# Patient Record
Sex: Female | Born: 1940 | Race: White | Hispanic: No | Smoking: Current every day smoker
Health system: Southern US, Community
[De-identification: ages and names within clinical notes are randomized; demographics above are authoritative.]

## PROBLEM LIST (undated history)

## (undated) DIAGNOSIS — E785 Hyperlipidemia, unspecified: Secondary | ICD-10-CM

## (undated) DIAGNOSIS — C541 Malignant neoplasm of endometrium: Secondary | ICD-10-CM

## (undated) DIAGNOSIS — F329 Major depressive disorder, single episode, unspecified: Secondary | ICD-10-CM

## (undated) DIAGNOSIS — C4491 Basal cell carcinoma of skin, unspecified: Secondary | ICD-10-CM

## (undated) DIAGNOSIS — T4145XA Adverse effect of unspecified anesthetic, initial encounter: Secondary | ICD-10-CM

## (undated) DIAGNOSIS — M1990 Unspecified osteoarthritis, unspecified site: Secondary | ICD-10-CM

## (undated) DIAGNOSIS — F32A Depression, unspecified: Secondary | ICD-10-CM

## (undated) DIAGNOSIS — J449 Chronic obstructive pulmonary disease, unspecified: Secondary | ICD-10-CM

## (undated) DIAGNOSIS — E538 Deficiency of other specified B group vitamins: Secondary | ICD-10-CM

## (undated) DIAGNOSIS — A630 Anogenital (venereal) warts: Secondary | ICD-10-CM

## (undated) DIAGNOSIS — I10 Essential (primary) hypertension: Secondary | ICD-10-CM

## (undated) DIAGNOSIS — R51 Headache: Secondary | ICD-10-CM

## (undated) DIAGNOSIS — G43909 Migraine, unspecified, not intractable, without status migrainosus: Secondary | ICD-10-CM

## (undated) DIAGNOSIS — T8859XA Other complications of anesthesia, initial encounter: Secondary | ICD-10-CM

## (undated) DIAGNOSIS — Z720 Tobacco use: Secondary | ICD-10-CM

## (undated) DIAGNOSIS — N390 Urinary tract infection, site not specified: Secondary | ICD-10-CM

## (undated) DIAGNOSIS — E559 Vitamin D deficiency, unspecified: Secondary | ICD-10-CM

## (undated) DIAGNOSIS — M810 Age-related osteoporosis without current pathological fracture: Secondary | ICD-10-CM

## (undated) HISTORY — DX: Deficiency of other specified B group vitamins: E53.8

## (undated) HISTORY — PX: APPENDECTOMY: SHX54

## (undated) HISTORY — DX: Vitamin D deficiency, unspecified: E55.9

## (undated) HISTORY — PX: ABDOMINAL HYSTERECTOMY: SHX81

## (undated) HISTORY — PX: THYROID SURGERY: SHX805

## (undated) HISTORY — DX: Malignant neoplasm of endometrium: C54.1

## (undated) HISTORY — DX: Unspecified osteoarthritis, unspecified site: M19.90

## (undated) HISTORY — DX: Basal cell carcinoma of skin, unspecified: C44.91

## (undated) HISTORY — PX: KYPHOPLASTY: SHX5884

## (undated) HISTORY — PX: VERTEBROPLASTY: SHX113

## (undated) HISTORY — DX: Urinary tract infection, site not specified: N39.0

## (undated) HISTORY — PX: CARPAL TUNNEL RELEASE: SHX101

## (undated) HISTORY — DX: Age-related osteoporosis without current pathological fracture: M81.0

## (undated) HISTORY — PX: COLONOSCOPY: SHX174

## (undated) HISTORY — DX: Migraine, unspecified, not intractable, without status migrainosus: G43.909

## (undated) HISTORY — DX: Essential (primary) hypertension: I10

## (undated) HISTORY — DX: Tobacco use: Z72.0

## (undated) HISTORY — DX: Anogenital (venereal) warts: A63.0

## (undated) NOTE — *Deleted (*Deleted)
PROGRESS NOTE    Holly Valenzuela  ZOX:[id number] DOB: [birth year] DOA: 03/22/2020 PCP: Rodrigo Ran, MD   Brief Narrative:   Holly Valenzuela a 59 y.o.femalewith medical history significant ofhypertension, hyperlipidemia, tobacco use, COPD, GERD, protein calorie malnutrition, recent admissions for syncope with subsequent rehab stay who presented with 1 day of nausea vomiting diarrhea.  10/24: ***   Assessment & Plan: Colitis     - started on zosyn initially and then transitioned to rocephin     - 10/24: ***   Constipation     - added bisacodyl spp; PRN MOM     - 10/24: ***  HTN     - BP looks fine; home losartan held for now  HLD     - continue lipitor   Migrains     - continue fiorecet  GERD     - continue PPI  Anxiety     - continue xanax  Generalized weakness     - PT/OT consult pending  Severe protein calorie malnutrition     - continue dietary supplement  DVT prophylaxis: lovenox Code Status: FULL Family Communication: ***   Status is: Inpatient  {Inpatient:23812}  Dispo: The patient is from: {From:23814}              Anticipated d/c is to: {To:23815}              Anticipated d/c date is: {Days:23816}              Patient currently {Medically stable:23817}      Consultants:   ***  Procedures:   ***  Antimicrobials:  . ***   ROS:  *** . Remainder ROS is negative for all not previously mentioned.  Subjective: ***  Objective: Vitals:   03/23/20 1300 03/23/20 1604 03/23/20 1950 03/24/20 0418  BP: 132/78 126/75 124/77 122/65  Pulse: (!) 59 (!) 58 (!) 56 (!) 54  Resp: 17 17 18 16   Temp:  98 F (36.7 C) 97.9 F (36.6 C) 98.6 F (37 C)  TempSrc:  Oral Oral Oral  SpO2: 93% 98% 97% 94%  Weight:      Height:        Intake/Output Summary (Last 24 hours) at 03/24/2020 8119 Last data filed at 03/24/2020 0600 Gross per 24 hour  Intake 2367.51 ml  Output 475 ml  Net 1892.51 ml   Filed Weights   03/22/20 1521   Weight: 38.8 kg    Examination:  General: 31 y.o. female resting in bed in NAD Eyes: PERRL, normal sclera ENMT: Nares patent w/o discharge, orophaynx clear, dentition normal, ears w/o discharge/lesions/ulcers Neck: Supple, trachea midline Cardiovascular: RRR, +S1, S2, no m/g/r, equal pulses throughout Respiratory: CTABL, no w/r/r, normal WOB GI: BS+, NDNT, no masses noted, no organomegaly noted MSK: No e/c/c Skin: No rashes, bruises, ulcerations noted Neuro: A&O x 3, no focal deficits Psyc: Appropriate interaction and affect, calm/cooperative   Data Reviewed: I have personally reviewed following labs and imaging studies.  CBC: Recent Labs  Lab 03/22/20 1523 03/23/20 0315 03/24/20 0302  WBC 20.3* 14.2* 10.3  NEUTROABS  --   --  7.1  HGB 14.1 12.2 11.6*  HCT 43.4 36.9 34.4*  MCV 100.9* 100.0 99.4  PLT 283 226 206   Basic Metabolic Panel: Recent Labs  Lab 03/22/20 1523 03/22/20 1844 03/23/20 0315 03/24/20 0302  NA 133*  --  135 132*  K 4.5  --  3.9 3.5  CL 101  --  105 103  CO2 22  --  22 23  GLUCOSE 98  --  82 92  BUN 16  --  13 9  CREATININE 0.83  --  0.81 0.59  CALCIUM 8.4*  --  7.8* 7.7*  MG  --  2.2  --   --   PHOS  --   --   --  2.5   GFR: Estimated Creatinine Clearance: 34.4 mL/min (by C-G formula based on SCr of 0.59 mg/dL). Liver Function Tests: Recent Labs  Lab 03/22/20 1523 03/23/20 0315 03/24/20 0302  AST 26 18  --   ALT 17 14  --   ALKPHOS 100 79  --   BILITOT 0.5 0.8  --   PROT 5.7* 4.8*  --   ALBUMIN 3.3* 2.7* 2.5*   Recent Labs  Lab 03/22/20 1523  LIPASE 24   No results for input(s): AMMONIA in the last 168 hours. Coagulation Profile: No results for input(s): INR, PROTIME in the last 168 hours. Cardiac Enzymes: No results for input(s): CKTOTAL, CKMB, CKMBINDEX, TROPONINI in the last 168 hours. BNP (last 3 results) No results for input(s): PROBNP in the last 8760 hours. HbA1C: No results for input(s): HGBA1C in the last 72  hours. CBG: Recent Labs  Lab 03/22/20 1526  GLUCAP 105*   Lipid Profile: No results for input(s): CHOL, HDL, LDLCALC, TRIG, CHOLHDL, LDLDIRECT in the last 72 hours. Thyroid Function Tests: No results for input(s): TSH, T4TOTAL, FREET4, T3FREE, THYROIDAB in the last 72 hours. Anemia Panel: No results for input(s): VITAMINB12, FOLATE, FERRITIN, TIBC, IRON, RETICCTPCT in the last 72 hours. Sepsis Labs: Recent Labs  Lab 03/22/20 1845  LATICACIDVEN 1.1    Recent Results (from the past 240 hour(s))  Blood culture (routine x 2)     Status: None (Preliminary result)   Collection Time: 03/22/20  6:33 PM   Specimen: BLOOD  Result Value Ref Range Status   Specimen Description BLOOD SITE NOT SPECIFIED  Final   Special Requests   Final    BOTTLES DRAWN AEROBIC AND ANAEROBIC Blood Culture adequate volume   Culture   Final    NO GROWTH < 12 HOURS Performed at [facility], [address] [place], [place] [address]    Report Status PENDING  Incomplete  Blood culture (routine x 2)     Status: None (Preliminary result)   Collection Time: 03/22/20  6:35 PM   Specimen: BLOOD  Result Value Ref Range Status   Specimen Description BLOOD SITE NOT SPECIFIED  Final   Special Requests   Final    BOTTLES DRAWN AEROBIC AND ANAEROBIC Blood Culture results may not be optimal due to an inadequate volume of blood received in culture bottles   Culture   Final    NO GROWTH < 12 HOURS Performed at [facility], [address] [place], [place] [address]    Report Status PENDING  Incomplete  Respiratory Panel by RT PCR (Flu A&B, Covid) - Nasopharyngeal Swab     Status: None   Collection Time: 03/22/20  7:08 PM   Specimen: Nasopharyngeal Swab  Result Value Ref Range Status   SARS Coronavirus 2 by RT PCR NEGATIVE NEGATIVE Final    Comment: (NOTE) SARS-CoV-2 target nucleic acids are NOT DETECTED.  The SARS-CoV-2 RNA is generally detectable in upper respiratoy specimens during the acute  phase of infection. The lowest concentration of SARS-CoV-2 viral copies this assay can detect is 131 copies/mL. A negative result does not preclude SARS-Cov-2 infection  and should not be used as the sole basis for treatment or other patient management decisions. A negative result may occur with  improper specimen collection/handling, submission of specimen other than nasopharyngeal swab, presence of viral mutation(s) within the areas targeted by this assay, and inadequate number of viral copies (<131 copies/mL). A negative result must be combined with clinical observations, patient history, and epidemiological information. The expected result is Negative.  Fact Sheet for Patients:  https://www.moore.com/  Fact Sheet for Healthcare Providers:  https://www.young.biz/  This test is no t yet approved or cleared by the [place] FDA and  has been authorized for detection and/or diagnosis of SARS-CoV-2 by FDA under an Emergency Use Authorization (EUA). This EUA will remain  in effect (meaning this test can be used) for the duration of the COVID-19 declaration under Section 564(b)(1) of the Act, 21 U.S.C. section 360bbb-3(b)(1), unless the authorization is terminated or revoked sooner.     Influenza A by PCR NEGATIVE NEGATIVE Final   Influenza B by PCR NEGATIVE NEGATIVE Final    Comment: (NOTE) The Xpert Xpress SARS-CoV-2/FLU/RSV assay is intended as an aid in  the diagnosis of influenza from Nasopharyngeal swab specimens and  should not be used as a sole basis for treatment. Nasal washings and  aspirates are unacceptable for Xpert Xpress SARS-CoV-2/FLU/RSV  testing.  Fact Sheet for Patients: https://www.moore.com/  Fact Sheet for Healthcare Providers: https://www.young.biz/  This test is not yet approved or cleared by the [place] FDA and  has been authorized for detection and/or diagnosis of  SARS-CoV-2 by  FDA under an Emergency Use Authorization (EUA). This EUA will remain  in effect (meaning this test can be used) for the duration of the  Covid-19 declaration under Section 564(b)(1) of the Act, 21  U.S.C. section 360bbb-3(b)(1), unless the authorization is  terminated or revoked. Performed at [facility], [address] [place], [place] [address]       Radiology Studies: CT Head Wo Contrast  Result Date: 03/22/2020 CLINICAL DATA:  Mental status changes EXAM: CT HEAD WITHOUT CONTRAST TECHNIQUE: Contiguous axial images were obtained from the base of the skull through the vertex without intravenous contrast. COMPARISON:  02/28/2020 FINDINGS: Brain: There is atrophy and chronic small vessel disease changes. No acute intracranial abnormality. Specifically, no hemorrhage, hydrocephalus, mass lesion, acute infarction, or significant intracranial injury. Vascular: No hyperdense vessel or unexpected calcification. Skull: No acute calvarial abnormality. Sinuses/Orbits: No acute findings Other: None IMPRESSION: Atrophy, chronic microvascular disease. No acute intracranial abnormality. Electronically Signed   By: Charlett Nose M.D.   On: 03/22/2020 17:42   CT ABDOMEN PELVIS W CONTRAST  Result Date: 03/22/2020 CLINICAL DATA:  Nausea, vomiting EXAM: CT ABDOMEN AND PELVIS WITH CONTRAST TECHNIQUE: Multidetector CT imaging of the abdomen and pelvis was performed using the standard protocol following bolus administration of intravenous contrast. CONTRAST:  80mL OMNIPAQUE IOHEXOL 300 MG/ML  SOLN COMPARISON:  11/30/2014 FINDINGS: Lower chest: Scarring in the lung bases.  No acute abnormality. Hepatobiliary: Enhancing lesion in the right hepatic dome measures 2.7 cm. Peripheral puddling of contrast compatible with hemangioma. Benign appearing cyst in the lower right hepatic lobe measures 1.4 cm. Small layering gallstones within the gallbladder. No biliary ductal dilatation. Pancreas: No focal  abnormality or ductal dilatation. Spleen: No focal abnormality.  Normal size. Adrenals/Urinary Tract: Small cysts in the lower pole of the right kidney and upper pole of the left kidney. No hydronephrosis. Adrenal glands and urinary bladder unremarkable. Right kidney is low, within  the upper pelvis. Stomach/Bowel: Large stool burden throughout the colon. Stomach and small bowel decompressed, grossly unremarkable. There is mild wall thickening involving the distal transverse colon, splenic flexure and descending colon concerning for colitis. Vascular/Lymphatic: Heavily calcified aorta and iliac vessels. No evidence of aneurysm or adenopathy. Reproductive: No visible mass. Lower pelvis obscured by beam hardening artifact from right hip replacement. Other: No free fluid or free air. Musculoskeletal: Right hip replacement. Prior bilateral sacroplasty. No acute bony abnormality. IMPRESSION: Areas of wall thickening from the distal transverse colon through the descending colon concerning for colitis. Cholelithiasis. Large stool burden throughout the colon. Aortic atherosclerosis. Electronically Signed   By: Charlett Nose M.D.   On: 03/22/2020 17:51     Scheduled Meds: . atorvastatin  10 mg Oral Daily  . cholecalciferol  1,000 Units Oral Daily  . enoxaparin (LOVENOX) injection  30 mg Subcutaneous Q24H  . feeding supplement  237 mL Oral BID BM  . multivitamin with minerals  1 tablet Oral Daily  . pantoprazole  40 mg Oral Daily  . Teriparatide (Recombinant)  20 mcg Subcutaneous Daily   Continuous Infusions: . sodium chloride 125 mL/hr at 03/24/20 0352  . cefTRIAXone (ROCEPHIN)  IV Stopped (03/23/20 1241)     LOS: 1 day    Time spent: ***   Teddy Spike, DO [facility]  If 7PM-7AM, please contact night-coverage [email or web address] 03/24/2020, 7:18 AM

---

## 1998-04-09 ENCOUNTER — Other Ambulatory Visit: Admission: RE | Admit: 1998-04-09 | Discharge: 1998-04-09 | Payer: Self-pay | Admitting: Urology

## 2000-05-19 ENCOUNTER — Other Ambulatory Visit: Admission: RE | Admit: 2000-05-19 | Discharge: 2000-05-19 | Payer: Self-pay | Admitting: Internal Medicine

## 2000-09-03 ENCOUNTER — Emergency Department (HOSPITAL_COMMUNITY): Admission: EM | Admit: 2000-09-03 | Discharge: 2000-09-04 | Payer: Self-pay | Admitting: Emergency Medicine

## 2000-09-04 ENCOUNTER — Encounter: Payer: Self-pay | Admitting: Emergency Medicine

## 2000-10-13 ENCOUNTER — Encounter: Payer: Self-pay | Admitting: General Surgery

## 2000-10-13 ENCOUNTER — Ambulatory Visit (HOSPITAL_COMMUNITY): Admission: RE | Admit: 2000-10-13 | Discharge: 2000-10-13 | Payer: Self-pay | Admitting: General Surgery

## 2000-11-03 ENCOUNTER — Observation Stay (HOSPITAL_COMMUNITY): Admission: RE | Admit: 2000-11-03 | Discharge: 2000-11-04 | Payer: Self-pay | Admitting: General Surgery

## 2000-11-03 ENCOUNTER — Encounter (INDEPENDENT_AMBULATORY_CARE_PROVIDER_SITE_OTHER): Payer: Self-pay | Admitting: Specialist

## 2001-05-18 ENCOUNTER — Emergency Department (HOSPITAL_COMMUNITY): Admission: EM | Admit: 2001-05-18 | Discharge: 2001-05-18 | Payer: Self-pay | Admitting: Emergency Medicine

## 2002-10-19 ENCOUNTER — Other Ambulatory Visit: Admission: RE | Admit: 2002-10-19 | Discharge: 2002-10-19 | Payer: Self-pay | Admitting: Obstetrics and Gynecology

## 2004-09-11 ENCOUNTER — Other Ambulatory Visit: Admission: RE | Admit: 2004-09-11 | Discharge: 2004-09-11 | Payer: Self-pay | Admitting: *Deleted

## 2006-01-06 ENCOUNTER — Ambulatory Visit: Payer: Self-pay | Admitting: Internal Medicine

## 2006-01-21 ENCOUNTER — Ambulatory Visit: Payer: Self-pay | Admitting: Internal Medicine

## 2006-01-21 ENCOUNTER — Encounter: Payer: Self-pay | Admitting: Internal Medicine

## 2006-02-12 ENCOUNTER — Other Ambulatory Visit: Admission: RE | Admit: 2006-02-12 | Discharge: 2006-02-12 | Payer: Self-pay | Admitting: Obstetrics & Gynecology

## 2006-08-25 ENCOUNTER — Other Ambulatory Visit: Admission: RE | Admit: 2006-08-25 | Discharge: 2006-08-25 | Payer: Self-pay | Admitting: Obstetrics & Gynecology

## 2007-03-31 ENCOUNTER — Other Ambulatory Visit: Admission: RE | Admit: 2007-03-31 | Discharge: 2007-03-31 | Payer: Self-pay | Admitting: Obstetrics & Gynecology

## 2007-09-29 ENCOUNTER — Other Ambulatory Visit: Admission: RE | Admit: 2007-09-29 | Discharge: 2007-09-29 | Payer: Self-pay | Admitting: Obstetrics & Gynecology

## 2009-05-21 DIAGNOSIS — F329 Major depressive disorder, single episode, unspecified: Secondary | ICD-10-CM | POA: Insufficient documentation

## 2009-05-21 DIAGNOSIS — K8020 Calculus of gallbladder without cholecystitis without obstruction: Secondary | ICD-10-CM | POA: Insufficient documentation

## 2009-05-21 DIAGNOSIS — G43909 Migraine, unspecified, not intractable, without status migrainosus: Secondary | ICD-10-CM | POA: Insufficient documentation

## 2009-05-21 DIAGNOSIS — C541 Malignant neoplasm of endometrium: Secondary | ICD-10-CM | POA: Insufficient documentation

## 2009-05-21 DIAGNOSIS — I10 Essential (primary) hypertension: Secondary | ICD-10-CM | POA: Diagnosis present

## 2009-05-21 DIAGNOSIS — Z720 Tobacco use: Secondary | ICD-10-CM

## 2009-05-21 DIAGNOSIS — M810 Age-related osteoporosis without current pathological fracture: Secondary | ICD-10-CM | POA: Insufficient documentation

## 2010-10-17 NOTE — Op Note (Signed)
[facility]  Patient:    Holly Valenzuela                      MRN: [id number] Proc. Date: 11/03/00 Adm. Date:  [id number] Attending:  Tempie Donning CC:         Rodrigo Ran, M.D.   Operative Report  OPERATIVE PROCEDURE:  Excision thyroid nodule (isthmusectomy).  SURGEON:  Gita Kudo, M.D.  ASSISTANT:  Velora Heckler, M.D.  ANESTHESIA:  General endotracheal.  PREOPERATIVE DIAGNOSIS:  Thyroid nodule.  POSTOPERATIVE DIAGNOSIS:  Thyroid nodule, in the isthmus, benign by frozen section.  CLINICAL SUMMARY:  A 70 year old female with symptoms of hyperthyroidism but normal thyroid function.  She has lost hair, become anxious, and lost weight. Her T4, TSH, and I 131 uptake were all normal.  A thyroid scan showed the palpable nodule in the midline of her neck to be in the thyroid and to be a hot nodule with the rest of the gland suppressed.  OPERATIVE FINDINGS:  The patient had a small palpable nodule in the isthmus. The remainder of the gland was normal.  Careful visual and palpable exam of both lobes revealed no other abnormality.  DESCRIPTION OF PROCEDURE:  Under satisfactory general endotracheal anesthesia, the patient was positioned and prepped and draped in a standard fashion.  A small transverse incision made, centered over the midline of the neck and carried down through the platysma.  Small flaps were developed and the midline opened.  The gland was carefully inspected and palpated, and the only abnormality felt to be in isthmus, and this was the only portion that I felt needed to be removed.  Accordingly, the isthmus was mobilized inferiorly between clamps and ties of 2-0 silk.  A clamp was then placed beneath the isthmus and above the trachea, developing that plane.  The thyroid was then clamped and cut, rotating the isthmus up to the midline and then over to the right.  The same procedure was used on the right side of the gland  and the isthmus excised and sent for frozen section.  The clamped thyroid was then controlled with 3-0 silk suture ligatures.  Bleeders were coagulated.  The wound was lavaged with saline and noted to be dry.  The midline was closed with interrupted 4-0 Vicryl figure-of-eight suture, platysma with interrupted inverted 4-0 Vicryl, subcutaneous with 5-0 Vicryl, skin edges with Steri-Strips.  Sterile absorbent dressing applied after the report came back from pathology.  She went to the recovery room from the operating room in good condition with normal sponge counts and no complications. DD:  11/03/00 TD:  11/03/00 Job: [id number]

## 2011-01-28 ENCOUNTER — Encounter: Payer: Self-pay | Admitting: Internal Medicine

## 2011-08-19 DIAGNOSIS — D539 Nutritional anemia, unspecified: Secondary | ICD-10-CM | POA: Insufficient documentation

## 2011-11-06 ENCOUNTER — Emergency Department (HOSPITAL_COMMUNITY)
Admission: EM | Admit: 2011-11-06 | Discharge: 2011-11-06 | Disposition: A | Discharge status: Home / Self Care | Payer: Medicare Other | Attending: Emergency Medicine | Admitting: Emergency Medicine

## 2011-11-06 ENCOUNTER — Emergency Department (HOSPITAL_COMMUNITY): Payer: Medicare Other

## 2011-11-06 ENCOUNTER — Encounter (HOSPITAL_COMMUNITY): Payer: Self-pay | Admitting: *Deleted

## 2011-11-06 DIAGNOSIS — Z79899 Other long term (current) drug therapy: Secondary | ICD-10-CM | POA: Insufficient documentation

## 2011-11-06 DIAGNOSIS — F3289 Other specified depressive episodes: Secondary | ICD-10-CM | POA: Insufficient documentation

## 2011-11-06 DIAGNOSIS — R112 Nausea with vomiting, unspecified: Secondary | ICD-10-CM | POA: Insufficient documentation

## 2011-11-06 DIAGNOSIS — F329 Major depressive disorder, single episode, unspecified: Secondary | ICD-10-CM | POA: Insufficient documentation

## 2011-11-06 DIAGNOSIS — N390 Urinary tract infection, site not specified: Secondary | ICD-10-CM | POA: Insufficient documentation

## 2011-11-06 HISTORY — DX: Depression, unspecified: F32.A

## 2011-11-06 HISTORY — DX: Major depressive disorder, single episode, unspecified: F32.9

## 2011-11-06 LAB — COMPREHENSIVE METABOLIC PANEL
AST: 22 U/L (ref 0–37)
CO2: 24 mEq/L (ref 19–32)
Calcium: 8.8 mg/dL (ref 8.4–10.5)
Creatinine, Ser: 0.56 mg/dL (ref 0.50–1.10)
GFR calc Af Amer: >90 mL/min (ref >90)
GFR calc non Af Amer: >90 mL/min (ref >90)

## 2011-11-06 LAB — CBC
HCT: 44.8 % (ref 36.0–46.0)
MCHC: 33.5 g/dL (ref 30.0–36.0)
MCV: 96.3 fL (ref 78.0–100.0)
RDW: 13.6 % (ref 11.5–15.5)

## 2011-11-06 LAB — URINALYSIS, ROUTINE W REFLEX MICROSCOPIC
Glucose, UA: NEGATIVE mg/dL
Nitrite: NEGATIVE
Protein, ur: 100 mg/dL — AB
Urobilinogen, UA: 0.2 mg/dL (ref 0.0–1.0)

## 2011-11-06 LAB — LIPASE, BLOOD: Lipase: 26 U/L (ref 11–59)

## 2011-11-06 LAB — LACTIC ACID, PLASMA: Lactic Acid, Venous: 2.5 mmol/L — ABNORMAL HIGH (ref 0.5–2.2)

## 2011-11-06 LAB — DIFFERENTIAL
Basophils Absolute: 0 10*3/uL (ref 0.0–0.1)
Eosinophils Relative: 0 % (ref 0–5)
Lymphocytes Relative: 14 % (ref 12–46)
Monocytes Absolute: 0.5 10*3/uL (ref 0.1–1.0)

## 2011-11-06 MED ORDER — SODIUM CHLORIDE 0.9 % IV BOLUS (SEPSIS)
1000.0000 mL | Freq: Once | INTRAVENOUS | Status: AC
  Administered 2011-11-06: 1000 mL via INTRAVENOUS

## 2011-11-06 MED ORDER — ONDANSETRON 8 MG PO TBDP: 8.0000 mg | ORAL_TABLET | Freq: Three times a day (TID) | ORAL | Status: AC | PRN

## 2011-11-06 MED ORDER — DEXTROSE 5 % IV SOLN
1.0000 g | Freq: Once | INTRAVENOUS | Status: AC
  Administered 2011-11-06: 1 g via INTRAVENOUS
  Filled 2011-11-06: qty 10

## 2011-11-06 MED ORDER — CEPHALEXIN 500 MG PO CAPS: 500.0000 mg | ORAL_CAPSULE | Freq: Four times a day (QID) | ORAL | Status: AC

## 2011-11-06 MED ORDER — ONDANSETRON HCL 4 MG/2ML IJ SOLN
4.0000 mg | Freq: Once | INTRAMUSCULAR | Status: AC
  Administered 2011-11-06: 4 mg via INTRAVENOUS
  Filled 2011-11-06: qty 2

## 2011-11-06 MED ORDER — ACETAMINOPHEN 325 MG PO TABS
650.0000 mg | ORAL_TABLET | Freq: Once | ORAL | Status: AC
  Administered 2011-11-06: 650 mg via ORAL
  Filled 2011-11-06: qty 2

## 2011-11-06 MED ORDER — SODIUM CHLORIDE 0.9 % IV SOLN: INTRAVENOUS | Status: DC

## 2011-11-06 MED ORDER — ONDANSETRON HCL 4 MG/2ML IJ SOLN
INTRAMUSCULAR | Status: AC
  Administered 2011-11-06: 16:00:00
  Filled 2011-11-06: qty 2

## 2011-11-06 MED ORDER — METOCLOPRAMIDE HCL 5 MG/ML IJ SOLN
10.0000 mg | Freq: Once | INTRAMUSCULAR | Status: AC
  Administered 2011-11-06: 10 mg via INTRAVENOUS
  Filled 2011-11-06: qty 2

## 2011-11-06 NOTE — Discharge Instructions (Signed)

## 2011-11-06 NOTE — ED Notes (Signed)
Per EMS pt in from home, pt had dental work done yesterday and has since began feeling nauseated and vomited every hour today. Poor PO intake today. Now feeling weak. Pt was given 4mg  Zofran pta.

## 2011-11-06 NOTE — ED Provider Notes (Signed)
History     CSN: [id number]  Arrival date & time 11/06/11  1535   First MD Initiated Contact with Patient 11/06/11 1553      Chief Complaint  Patient presents with  . Nausea  . Emesis    (Consider location/radiation/quality/duration/timing/severity/associated sxs/prior treatment) HPI Comments: Patient had dental work done yesterday with local anesthesia and since has had nausea and multiple episodes of nbnb emesis.  Feels drained and weak due to the vomiting.  No abdominal pain  Patient is a 71 y.o. female presenting with vomiting. The history is provided by the patient. No language interpreter was used.  Emesis  This is a new problem. The current episode started yesterday. The problem occurs more than 10 times per day. The problem has been gradually worsening. The emesis has an appearance of stomach contents. There has been no fever. Pertinent negatives include no abdominal pain, no arthralgias, no chills, no cough, no diarrhea, no fever, no headaches, no myalgias and no sweats.    Past Medical History  Diagnosis Date  . Depression     Past Surgical History  Procedure Date  . Cesarean section   . Appendectomy   . Thyroid surgery     History reviewed. No pertinent family history.  History  Substance Use Topics  . Smoking status: Current Everyday Smoker    Types: Cigarettes  . Smokeless tobacco: Not on file  . Alcohol Use: No    OB History    Grav Para Term Preterm Abortions TAB SAB Ect Mult Living                  Review of Systems  Constitutional: Positive for fatigue. Negative for fever, chills, activity change and appetite change.  HENT: Negative for congestion, sore throat, rhinorrhea, neck pain and neck stiffness.   Respiratory: Negative for cough and shortness of breath.   Cardiovascular: Negative for chest pain and palpitations.  Gastrointestinal: Positive for nausea and vomiting. Negative for abdominal pain, diarrhea and constipation.  Genitourinary:  Negative for dysuria, urgency, frequency and flank pain.  Musculoskeletal: Negative for myalgias, back pain and arthralgias.  Neurological: Negative for dizziness, weakness, light-headedness, numbness and headaches.  All other systems reviewed and are negative.    Allergies  Review of patient's allergies indicates no known allergies.  Home Medications   Current Outpatient Rx  Name Route Sig Dispense Refill  . ACETAMINOPHEN 500 MG PO TABS Oral Take 1,000 mg by mouth every 6 (six) hours as needed. Pain.    . ASPIRIN-ACETAMINOPHEN-CAFFEINE 250-250-65 MG PO TABS Oral Take 1 tablet by mouth every 6 (six) hours as needed. Pain.    . BUPROPION HCL ER (XL) 150 MG PO TB24 Oral Take 150 mg by mouth daily.    . CYANOCOBALAMIN 1000 MCG/ML IJ SOLN Intramuscular Inject 1,000 mcg into the muscle once.    . ADULT MULTIVITAMIN W/MINERALS CH Oral Take 1 tablet by mouth daily.    . SERTRALINE HCL 100 MG PO TABS Oral Take 100 mg by mouth daily.    . CEPHALEXIN 500 MG PO CAPS Oral Take 1 capsule (500 mg total) by mouth 4 (four) times daily. 28 capsule 0  . ONDANSETRON 8 MG PO TBDP Oral Take 1 tablet (8 mg total) by mouth every 8 (eight) hours as needed for nausea. 20 tablet 0  . ZOFRAN IV Intravenous Inject 1 each into the vein.      BP 138/83  Pulse 73  Temp(Src) 98.3 F (36.8 C) (Oral)  Resp  20  SpO2 95%  Physical Exam  Nursing note and vitals reviewed. Constitutional: She is oriented to person, place, and time. She appears well-developed and well-nourished. No distress.       Appears uncomfortable  HENT:  Head: Normocephalic and atraumatic.  Mouth/Throat: Oropharynx is clear and moist.  Eyes: Conjunctivae and EOM are normal. Pupils are equal, round, and reactive to light.  Neck: Normal range of motion. Neck supple.  Cardiovascular: Normal rate, regular rhythm, normal heart sounds and intact distal pulses.  Exam reveals no gallop and no friction rub.   No murmur heard. Pulmonary/Chest: Effort  normal and breath sounds normal. No respiratory distress. She exhibits no tenderness.  Abdominal: Soft. Bowel sounds are normal. There is no tenderness. There is no rebound and no guarding.  Musculoskeletal: Normal range of motion. She exhibits no edema and no tenderness.  Neurological: She is alert and oriented to person, place, and time. No cranial nerve deficit.  Skin: Skin is warm and dry. No rash noted.    ED Course  Procedures (including critical care time)  Labs Reviewed  CBC - Abnormal; Notable for the following:    WBC 10.7 (*)    All other components within normal limits  DIFFERENTIAL - Abnormal; Notable for the following:    Neutrophils Relative 82 (*)    Neutro Abs 8.7 (*)    All other components within normal limits  COMPREHENSIVE METABOLIC PANEL - Abnormal; Notable for the following:    Glucose, Bld 121 (*)    Alkaline Phosphatase 134 (*)    All other components within normal limits  URINALYSIS, ROUTINE W REFLEX MICROSCOPIC - Abnormal; Notable for the following:    Hgb urine dipstick SMALL (*)    Protein, ur 100 (*)    Leukocytes, UA MODERATE (*)    All other components within normal limits  LACTIC ACID, PLASMA - Abnormal; Notable for the following:    Lactic Acid, Venous 2.5 (*)    All other components within normal limits  URINE MICROSCOPIC-ADD ON - Abnormal; Notable for the following:    Squamous Epithelial / LPF FEW (*)    All other components within normal limits  LIPASE, BLOOD   Dg Abd Acute W/chest  11/06/2011  *RADIOLOGY REPORT*  Clinical Data: Vomiting  ACUTE ABDOMEN SERIES (ABDOMEN 2 VIEW & CHEST 1 VIEW)  Comparison: None.  Findings: Increased interstitial markings.  Lungs are otherwise clear.  No pleural effusion or pneumothorax.  Cardiomediastinal silhouette is within normal limits.  Nonobstructive bowel gas pattern.  Moderate stool in the left colon.  No evidence of free air under the diaphragm on the upright view.  Degenerative changes with thoracolumbar  scoliosis.  IMPRESSION: No evidence of acute cardiopulmonary disease.  Chronic interstitial markings.  No evidence of small bowel obstruction or free air.  Moderate stool in the left colon.  Original Report Authenticated By: Charline Bills, M.D.     1. Nausea and vomiting   2. UTI (lower urinary tract infection)       MDM  Nausea and vomiting with urinary tract infection. A urine culture was sent. She received a dose of Rocephin and multiple doses of antiemetics. She was able tolerate oral intake without difficulty. She ambulated around the department without symptoms. She'll be discharged home with antiemetics and Keflex. Provided strict return precautions.        Dayton Bailiff, MD 11/06/11 [id number]

## 2011-11-21 ENCOUNTER — Emergency Department (HOSPITAL_COMMUNITY): Payer: Medicare Other

## 2011-11-21 ENCOUNTER — Encounter (HOSPITAL_COMMUNITY): Payer: Self-pay | Admitting: *Deleted

## 2011-11-21 ENCOUNTER — Observation Stay (HOSPITAL_COMMUNITY)
Admission: EM | Admit: 2011-11-21 | Discharge: 2011-11-23 | Disposition: A | Discharge status: Home / Self Care | Payer: Medicare Other | Source: Ambulatory Visit | Attending: Internal Medicine | Admitting: Internal Medicine

## 2011-11-21 DIAGNOSIS — R55 Syncope and collapse: Secondary | ICD-10-CM | POA: Insufficient documentation

## 2011-11-21 DIAGNOSIS — J438 Other emphysema: Secondary | ICD-10-CM | POA: Insufficient documentation

## 2011-11-21 DIAGNOSIS — D7289 Other specified disorders of white blood cells: Secondary | ICD-10-CM

## 2011-11-21 DIAGNOSIS — S2249XA Multiple fractures of ribs, unspecified side, initial encounter for closed fracture: Principal | ICD-10-CM | POA: Insufficient documentation

## 2011-11-21 DIAGNOSIS — F32A Depression, unspecified: Secondary | ICD-10-CM | POA: Diagnosis present

## 2011-11-21 DIAGNOSIS — S2239XA Fracture of one rib, unspecified side, initial encounter for closed fracture: Secondary | ICD-10-CM

## 2011-11-21 DIAGNOSIS — F341 Dysthymic disorder: Secondary | ICD-10-CM

## 2011-11-21 DIAGNOSIS — R079 Chest pain, unspecified: Secondary | ICD-10-CM | POA: Diagnosis present

## 2011-11-21 DIAGNOSIS — R0602 Shortness of breath: Secondary | ICD-10-CM | POA: Diagnosis not present

## 2011-11-21 DIAGNOSIS — Y998 Other external cause status: Secondary | ICD-10-CM | POA: Insufficient documentation

## 2011-11-21 DIAGNOSIS — Y9210 Unspecified residential institution as the place of occurrence of the external cause: Secondary | ICD-10-CM | POA: Insufficient documentation

## 2011-11-21 DIAGNOSIS — D72829 Elevated white blood cell count, unspecified: Secondary | ICD-10-CM | POA: Diagnosis present

## 2011-11-21 DIAGNOSIS — F329 Major depressive disorder, single episode, unspecified: Secondary | ICD-10-CM | POA: Diagnosis not present

## 2011-11-21 DIAGNOSIS — Y92009 Unspecified place in unspecified non-institutional (private) residence as the place of occurrence of the external cause: Secondary | ICD-10-CM | POA: Insufficient documentation

## 2011-11-21 DIAGNOSIS — F3289 Other specified depressive episodes: Secondary | ICD-10-CM | POA: Insufficient documentation

## 2011-11-21 DIAGNOSIS — W19XXXA Unspecified fall, initial encounter: Secondary | ICD-10-CM | POA: Insufficient documentation

## 2011-11-21 LAB — CARDIAC PANEL(CRET KIN+CKTOT+MB+TROPI)
CK, MB: 3.3 ng/mL (ref 0.3–4.0)
Troponin I: <0.3 ng/mL (ref <0.30)

## 2011-11-21 LAB — BASIC METABOLIC PANEL
BUN: 15 mg/dL (ref 6–23)
CO2: 24 mEq/L (ref 19–32)
Calcium: 8.8 mg/dL (ref 8.4–10.5)
GFR calc non Af Amer: 67 mL/min — ABNORMAL LOW (ref >90)
Glucose, Bld: 78 mg/dL (ref 70–99)

## 2011-11-21 LAB — URINALYSIS, ROUTINE W REFLEX MICROSCOPIC
Glucose, UA: NEGATIVE mg/dL
Specific Gravity, Urine: 1.011 (ref 1.005–1.030)

## 2011-11-21 LAB — CBC
HCT: 40.6 % (ref 36.0–46.0)
HCT: 42.4 % (ref 36.0–46.0)
Hemoglobin: 13.7 g/dL (ref 12.0–15.0)
Hemoglobin: 14.2 g/dL (ref 12.0–15.0)
MCH: 32.9 pg (ref 26.0–34.0)
MCH: 32.6 pg (ref 26.0–34.0)
MCHC: 33.7 g/dL (ref 30.0–36.0)
MCHC: 33.5 g/dL (ref 30.0–36.0)
MCV: 97.4 fL (ref 78.0–100.0)

## 2011-11-21 LAB — CREATININE, SERUM: GFR calc non Af Amer: 67 mL/min — ABNORMAL LOW (ref >90)

## 2011-11-21 LAB — URINE MICROSCOPIC-ADD ON

## 2011-11-21 MED ORDER — VITAMIN D (ERGOCALCIFEROL) 1.25 MG (50000 UNIT) PO CAPS: 50000.0000 [IU] | ORAL_CAPSULE | ORAL | Status: DC

## 2011-11-21 MED ORDER — ONDANSETRON HCL 4 MG/2ML IJ SOLN
4.0000 mg | Freq: Once | INTRAMUSCULAR | Status: AC
  Administered 2011-11-21: 4 mg via INTRAVENOUS
  Filled 2011-11-21: qty 2

## 2011-11-21 MED ORDER — MORPHINE SULFATE 2 MG/ML IJ SOLN: 1.0000 mg | INTRAMUSCULAR | Status: DC | PRN

## 2011-11-21 MED ORDER — HYDROMORPHONE HCL PF 1 MG/ML IJ SOLN
0.5000 mg | INTRAMUSCULAR | Status: DC | PRN
  Administered 2011-11-21 – 2011-11-22 (×2): 1 mg via INTRAVENOUS
  Filled 2011-11-21 (×2): qty 1

## 2011-11-21 MED ORDER — MORPHINE SULFATE 4 MG/ML IJ SOLN
3.0000 mg | Freq: Once | INTRAMUSCULAR | Status: AC
  Administered 2011-11-21: 3 mg via INTRAVENOUS
  Filled 2011-11-21: qty 1

## 2011-11-21 MED ORDER — BUPROPION HCL ER (XL) 150 MG PO TB24
150.0000 mg | ORAL_TABLET | Freq: Every day | ORAL | Status: DC
  Administered 2011-11-21 – 2011-11-22 (×2): 150 mg via ORAL
  Filled 2011-11-21 (×3): qty 1

## 2011-11-21 MED ORDER — ENOXAPARIN SODIUM 40 MG/0.4ML ~~LOC~~ SOLN
40.0000 mg | SUBCUTANEOUS | Status: DC
  Administered 2011-11-21 – 2011-11-22 (×2): 40 mg via SUBCUTANEOUS
  Filled 2011-11-21 (×3): qty 0.4

## 2011-11-21 MED ORDER — ALBUTEROL SULFATE (5 MG/ML) 0.5% IN NEBU: 2.5000 mg | INHALATION_SOLUTION | RESPIRATORY_TRACT | Status: DC | PRN

## 2011-11-21 MED ORDER — CYANOCOBALAMIN 1000 MCG/ML IJ SOLN: 1000.0000 ug | INTRAMUSCULAR | Status: DC

## 2011-11-21 MED ORDER — ACETAMINOPHEN 650 MG RE SUPP: 650.0000 mg | Freq: Four times a day (QID) | RECTAL | Status: DC | PRN

## 2011-11-21 MED ORDER — ASPIRIN EC 81 MG PO TBEC
81.0000 mg | DELAYED_RELEASE_TABLET | Freq: Every day | ORAL | Status: DC
  Administered 2011-11-21 – 2011-11-23 (×2): 81 mg via ORAL
  Filled 2011-11-21 (×3): qty 1

## 2011-11-21 MED ORDER — ONDANSETRON HCL 4 MG/2ML IJ SOLN
4.0000 mg | Freq: Four times a day (QID) | INTRAMUSCULAR | Status: DC | PRN
  Administered 2011-11-21 – 2011-11-23 (×3): 4 mg via INTRAVENOUS
  Filled 2011-11-21 (×3): qty 2

## 2011-11-21 MED ORDER — ACETAMINOPHEN 325 MG PO TABS
650.0000 mg | ORAL_TABLET | Freq: Four times a day (QID) | ORAL | Status: DC | PRN
  Filled 2011-11-21 (×2): qty 2

## 2011-11-21 MED ORDER — HYDROMORPHONE HCL PF 1 MG/ML IJ SOLN
1.0000 mg | INTRAMUSCULAR | Status: DC | PRN
  Administered 2011-11-21: 1 mg via INTRAVENOUS
  Filled 2011-11-21: qty 1

## 2011-11-21 MED ORDER — MORPHINE SULFATE 2 MG/ML IJ SOLN
2.0000 mg | Freq: Once | INTRAMUSCULAR | Status: AC
  Administered 2011-11-21: 2 mg via INTRAVENOUS
  Filled 2011-11-21: qty 1

## 2011-11-21 MED ORDER — ALUM & MAG HYDROXIDE-SIMETH 200-200-20 MG/5ML PO SUSP
30.0000 mL | Freq: Four times a day (QID) | ORAL | Status: DC | PRN
  Administered 2011-11-22: 30 mL via ORAL
  Filled 2011-11-21: qty 30

## 2011-11-21 MED ORDER — ADULT MULTIVITAMIN W/MINERALS CH
1.0000 | ORAL_TABLET | Freq: Every evening | ORAL | Status: DC
  Administered 2011-11-21: 1 via ORAL
  Filled 2011-11-21 (×3): qty 1

## 2011-11-21 MED ORDER — HYDROCODONE-ACETAMINOPHEN 5-325 MG PO TABS: 1.0000 | ORAL_TABLET | ORAL | Status: DC | PRN

## 2011-11-21 MED ORDER — MORPHINE SULFATE 2 MG/ML IJ SOLN: 2.0000 mg | INTRAMUSCULAR | Status: DC | PRN

## 2011-11-21 MED ORDER — ONDANSETRON HCL 4 MG PO TABS
4.0000 mg | ORAL_TABLET | Freq: Four times a day (QID) | ORAL | Status: DC | PRN
  Filled 2011-11-21: qty 1

## 2011-11-21 MED ORDER — SERTRALINE HCL 100 MG PO TABS
100.0000 mg | ORAL_TABLET | Freq: Every day | ORAL | Status: DC
  Administered 2011-11-21 – 2011-11-22 (×2): 100 mg via ORAL
  Filled 2011-11-21 (×3): qty 1

## 2011-11-21 MED ORDER — SODIUM CHLORIDE 0.9 % IV SOLN
INTRAVENOUS | Status: AC
  Administered 2011-11-21: 23:00:00 via INTRAVENOUS

## 2011-11-21 MED ORDER — SENNA 8.6 MG PO TABS
1.0000 | ORAL_TABLET | Freq: Two times a day (BID) | ORAL | Status: DC
  Administered 2011-11-21 – 2011-11-23 (×2): 8.6 mg via ORAL
  Filled 2011-11-21 (×5): qty 1

## 2011-11-21 MED ORDER — SUMATRIPTAN SUCCINATE 100 MG PO TABS: 100.0000 mg | ORAL_TABLET | ORAL | Status: DC | PRN
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
## 2011-11-21 NOTE — ED Notes (Signed)
Regular diet tray ordered

## 2011-11-22 DIAGNOSIS — D7289 Other specified disorders of white blood cells: Secondary | ICD-10-CM

## 2011-11-22 DIAGNOSIS — I519 Heart disease, unspecified: Secondary | ICD-10-CM

## 2011-11-22 DIAGNOSIS — R55 Syncope and collapse: Secondary | ICD-10-CM

## 2011-11-22 DIAGNOSIS — F341 Dysthymic disorder: Secondary | ICD-10-CM

## 2011-11-22 DIAGNOSIS — S2239XA Fracture of one rib, unspecified side, initial encounter for closed fracture: Secondary | ICD-10-CM

## 2011-11-22 LAB — COMPREHENSIVE METABOLIC PANEL
ALT: 10 U/L (ref 0–35)
Albumin: 3.4 g/dL — ABNORMAL LOW (ref 3.5–5.2)
Alkaline Phosphatase: 105 U/L (ref 39–117)
BUN: 11 mg/dL (ref 6–23)
Chloride: 103 mEq/L (ref 96–112)
Potassium: 3.8 mEq/L (ref 3.5–5.1)
Sodium: 137 mEq/L (ref 135–145)
Total Bilirubin: 0.3 mg/dL (ref 0.3–1.2)

## 2011-11-22 LAB — CBC
Hemoglobin: 13.4 g/dL (ref 12.0–15.0)
MCH: 32.5 pg (ref 26.0–34.0)
MCV: 96.8 fL (ref 78.0–100.0)
RBC: 4.12 MIL/uL (ref 3.87–5.11)
WBC: 9.1 10*3/uL (ref 4.0–10.5)

## 2011-11-22 LAB — PROTIME-INR: Prothrombin Time: 13.5 seconds (ref 11.6–15.2)

## 2011-11-22 MED ORDER — FENTANYL 25 MCG/HR TD PT72
25.0000 ug | MEDICATED_PATCH | TRANSDERMAL | Status: DC
  Administered 2011-11-22: 25 ug via TRANSDERMAL
  Filled 2011-11-22: qty 1

## 2011-11-22 MED ORDER — PROMETHAZINE HCL 25 MG/ML IJ SOLN
12.5000 mg | Freq: Four times a day (QID) | INTRAMUSCULAR | Status: DC | PRN
  Administered 2011-11-22 (×2): 12.5 mg via INTRAVENOUS
  Filled 2011-11-22 (×3): qty 1

## 2011-11-22 MED ORDER — SODIUM CHLORIDE 0.9 % IV SOLN: INTRAVENOUS | Status: DC

## 2011-11-22 MED ORDER — PANTOPRAZOLE SODIUM 40 MG PO TBEC
40.0000 mg | DELAYED_RELEASE_TABLET | Freq: Two times a day (BID) | ORAL | Status: DC
  Administered 2011-11-23: 40 mg via ORAL
  Filled 2011-11-22: qty 2
  Filled 2011-11-22 (×2): qty 1

## 2011-11-22 MED ORDER — NAPROXEN 375 MG PO TABS
375.0000 mg | ORAL_TABLET | Freq: Two times a day (BID) | ORAL | Status: DC
  Administered 2011-11-23: 375 mg via ORAL
  Filled 2011-11-22 (×5): qty 1

## 2011-11-22 MED ORDER — ACETAMINOPHEN 325 MG PO TABS
650.0000 mg | ORAL_TABLET | Freq: Four times a day (QID) | ORAL | Status: DC
  Administered 2011-11-23 (×2): 650 mg via ORAL
  Filled 2011-11-22: qty 6

## 2011-11-22 MED ORDER — TRAMADOL HCL 50 MG PO TABS: 50.0000 mg | ORAL_TABLET | Freq: Four times a day (QID) | ORAL | Status: DC | PRN
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
## 2011-11-22 NOTE — Progress Notes (Signed)
  Echocardiogram 2D Echocardiogram has been performed.  Georgian Co 11/22/2011, 4:13 PM
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
## 2011-11-22 NOTE — Progress Notes (Signed)
PT Cancellation Note  Treatment cancelled today due to medical issues with patient which prohibited therapy. Patient with nausea and vomiting in am.  In pm, patient very lethargic from nausea meds.  Attempted to sit on EOB and patient became nauseated again.  Will hold PT eval today, and return tomorrow.  Vena Austria 11/22/2011, 3:41 PM [phone]

## 2011-11-22 NOTE — ED Provider Notes (Addendum)
Medical screening examination/treatment/procedure(s) were performed by non-physician practitioner and as supervising physician I was immediately available for consultation/collaboration.    Nelia Shi, MD 11/22/11 0710  SINUS RHYTHM ~ normal P axis,  V-rate=63 PROBABLE RIGHT VENTRICULAR HYPERTROPHY ~ prominent R or R' w/ RAD or RAA Abnormal ECG No previous ECGs available  Nelia Shi, MD 01/28/12 [phone]

## 2011-11-23 DIAGNOSIS — S2239XA Fracture of one rib, unspecified side, initial encounter for closed fracture: Secondary | ICD-10-CM

## 2011-11-23 DIAGNOSIS — R55 Syncope and collapse: Secondary | ICD-10-CM

## 2011-11-23 DIAGNOSIS — D7289 Other specified disorders of white blood cells: Secondary | ICD-10-CM

## 2011-11-23 DIAGNOSIS — F341 Dysthymic disorder: Secondary | ICD-10-CM

## 2011-11-23 MED ORDER — ACETAMINOPHEN 325 MG PO TABS: 650.0000 mg | ORAL_TABLET | Freq: Four times a day (QID) | ORAL | Status: AC

## 2011-11-23 MED ORDER — TRAMADOL HCL 50 MG PO TABS: 50.0000 mg | ORAL_TABLET | Freq: Four times a day (QID) | ORAL | Status: AC | PRN

## 2011-11-23 MED ORDER — FENTANYL 25 MCG/HR TD PT72: 1.0000 | MEDICATED_PATCH | TRANSDERMAL | Status: AC

## 2011-11-23 MED ORDER — SENNA 8.6 MG PO TABS: 1.0000 | ORAL_TABLET | Freq: Every day | ORAL | Status: DC | PRN

## 2011-11-23 MED ORDER — PANTOPRAZOLE SODIUM 40 MG PO TBEC: 40.0000 mg | DELAYED_RELEASE_TABLET | Freq: Every day | ORAL | Status: DC

## 2011-11-23 MED ORDER — NAPROXEN 375 MG PO TABS: 375.0000 mg | ORAL_TABLET | Freq: Two times a day (BID) | ORAL | Status: AC

## 2011-11-23 NOTE — Evaluation (Signed)
Physical Therapy Evaluation Patient Details Name: Holly Valenzuela MRN: [id number] DOB: 30-Sep-1940 Today's Date: 11/23/2011 Time: 2956-2130 PT Time Calculation (min): 23 min  PT Assessment / Plan / Recommendation Clinical Impression  Patient presents S/P fall resulting in rib fractures. She is usually very independent, but has plans to discharge to her sisters to ensure her safety at discharge. We will follow acutely to ensure that patient can increase her independence and safety with gait to ensure that she has decreased fall risk at discharge.     PT Assessment  Patient needs continued PT services    Follow Up Recommendations  Home health PT;Supervision - Intermittent    Barriers to Discharge  None      lEquipment Recommendations   (TBA)    Recommendations for Other Services  None   Frequency Min 4X/week    Precautions / Restrictions Precautions Precautions: Fall Precaution Comments: Patient with continued nausea and small amount of emesis. Pre-medicated with anti-nausea medicine.    Pertinent Vitals/Pain VSS/ pain of ribs 3/10      Mobility  Bed Mobility Bed Mobility: Rolling Right;Right Sidelying to Sit Rolling Right: 6: Modified independent (Device/Increase time) Right Sidelying to Sit: 6: Modified independent (Device/Increase time);HOB elevated;With rails Details for Bed Mobility Assistance: good sequencing of movement to limit pain of rib cage without cues needed.  Transfers Transfers: Sit to Stand;Stand to Sit Sit to Stand: 4: Min guard;With upper extremity assist;From bed Stand to Sit: To chair/3-in-1;With upper extremity assist;4: Min guard Details for Transfer Assistance: Close guarding secondary to patient fearful.  Ambulation/Gait Ambulation/Gait Assistance: 4: Min guard Ambulation Distance (Feet): 20 Feet Assistive device: None Ambulation/Gait Assistance Details: Patient self limited distance as did not want to over do it secondary to persistent emesis  that is worse with movement. Tendency to reach for end of bed or other surfaces for support. Gait Pattern: Shuffle     PT Diagnosis: Difficulty walking  PT Problem List: Decreased strength;Decreased activity tolerance;Decreased balance;Decreased mobility;Pain PT Treatment Interventions: Gait training;Therapeutic activities;Therapeutic exercise;Balance training;Patient/family education;DME instruction   PT Goals Acute Rehab PT Goals PT Goal Formulation: With patient Time For Goal Achievement: 11/30/11 Potential to Achieve Goals: Good Pt will go Supine/Side to Sit: with modified independence;with HOB 0 degrees PT Goal: Supine/Side to Sit - Progress: Goal set today Pt will go Sit to Supine/Side: with HOB 0 degrees;with modified independence PT Goal: Sit to Supine/Side - Progress: Goal set today Pt will go Sit to Stand: with modified independence;with upper extremity assist PT Goal: Sit to Stand - Progress: Goal set today Pt will go Stand to Sit: with modified independence;with upper extremity assist PT Goal: Stand to Sit - Progress: Goal set today Pt will Ambulate: >150 feet;with modified independence;with least restrictive assistive device PT Goal: Ambulate - Progress: Goal set today  Visit Information  Last PT Received On: 11/23/11 Assistance Needed: +1    Subjective Data  Subjective: Patient reports that she is very active PTa. She states that she is having signficantly less nausea and pain today.  Patient Stated Goal: Up out of bed.    Prior Functioning  Home Living Lives With: Alone Available Help at Discharge: Family;Available 24 hours/day Type of Home: House Home Access: Stairs to enter [employer] of Steps: 5 Entrance Stairs-Rails: Left Home Layout: One level Bathroom Shower/Tub: Health visitor: Standard Home Adaptive Equipment: Straight cane Additional Comments: Returning to sisters at discharge where the house has no stairs and is handicap  accessible.  Prior Function Level  of Independence: Independent Able to Take Stairs?: Yes Driving: Yes Vocation: Retired Musician: No difficulties    Cognition  Overall Cognitive Status: Appears within functional limits for tasks assessed/performed Arousal/Alertness: Awake/alert Orientation Level: Appears intact for tasks assessed Behavior During Session: [facility] for tasks performed    Extremity/Trunk Assessment Right Lower Extremity Assessment RLE ROM/Strength/Tone: WFL for tasks assessed RLE Coordination: WFL - gross/fine motor Left Lower Extremity Assessment LLE ROM/Strength/Tone: WFL for tasks assessed LLE Coordination: WFL - gross/fine motor Trunk Assessment Trunk Assessment: Normal   Balance Balance Balance Assessed: Yes Static Standing Balance Static Standing - Balance Support: No upper extremity supported Static Standing - Level of Assistance: 5: Stand by assistance  End of Session PT - End of Session Equipment Utilized During Treatment: Gait belt Activity Tolerance: Patient limited by pain (nausea) Patient left: in chair;with call bell/phone within reach Nurse Communication: Mobility status   Edwyna Perfect, PT  Pager [phone]  11/23/2011, 8:01 AM

## 2011-11-23 NOTE — Progress Notes (Signed)
This patient has been seen and I agree with the findings and treatment plan.  Mita Vallo O. Xinyi Batton, III, MD, FACS [phone] (pager) [phone] (direct pager) Trauma Surgeon  

## 2011-11-23 NOTE — ED Provider Notes (Signed)
I saw and evaluated the patient, reviewed the resident's note and I agree with the findings and plan.   .Face to face Exam:  General:  Awake HEENT:  Atraumatic Resp:  Normal effort Abd:  Nondistended Neuro:No focal weakness Lymph: No adenopathy   Nelia Shi, MD 11/23/11 1551

## 2011-11-23 NOTE — Progress Notes (Signed)
Patient ID: GODDESS GEBBIA, female   DOB: December 16, 1940, 71 y.o.   MRN: [id number]   LOS: 2 days   Subjective: Feeling better today, pain much better. Has been vomiting frequently, thinks it's pain medicine.  Objective: Vital signs in last 24 hours: Temp:  [98.2 F (36.8 C)-98.9 F (37.2 C)] 98.2 F (36.8 C) (06/24 0500) Pulse Rate:  [78-79] 79  (06/24 0500) Resp:  [16-18] 18  (06/24 0500) BP: (122-150)/(71-73) 122/71 mmHg (06/24 0500) SpO2:  [96 %] 96 % (06/24 0500) on 2 liters via [place] Last BM Date: 11/20/11   IS:   General appearance: alert and no distress Resp: clear to auscultation bilaterally and distant Cardio: regular rate and rhythm  Assessment/Plan: Fall Multiple right rib fxs -- Continue pain control and pulmonary toilet. Be careful with NSAID in this age population but agree with use. If N/V continue would d/c Duragesic patch. Agree with tramadol but need to be cautious given concurrent Wellbutrin/Zoloft. Went over natural history of injury. Ok for discharge from trauma standpoint. May follow up with PCP but can call us with questions. We will follow for now. Multiple medical problems -- per primary service   Freeman Caldron, PA-C Pager: [phone] General Trauma PA Pager: [phone]   11/23/2011

## 2011-11-23 NOTE — Progress Notes (Signed)
Utilization review complete 

## 2011-11-23 NOTE — Progress Notes (Signed)
Clinical Social Work: Coverage for Trauma  Completed required SBIRT screening on patient. No known substance abuse other than cigarettes. No interventions required and no resources needed at this time.  No social work referral for disposition or other needs at this time. If needs arise, please contact.  Ashley Jacobs, MSW LCSW [phone]  (Coverage for Macario Golds)
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
## 2011-11-23 NOTE — Progress Notes (Signed)
D/c orders received;iv removed with gauze on, pt remains in stable condition, pt meds and instructions reviewed and given to pt; pt d/c to home
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
## 2012-01-16 ENCOUNTER — Other Ambulatory Visit (HOSPITAL_COMMUNITY): Payer: Self-pay | Admitting: Internal Medicine

## 2012-02-18 ENCOUNTER — Encounter: Payer: Self-pay | Admitting: Internal Medicine

## 2012-02-19 DIAGNOSIS — Z2839 Other underimmunization status: Secondary | ICD-10-CM | POA: Insufficient documentation

## 2012-10-31 DIAGNOSIS — K635 Polyp of colon: Secondary | ICD-10-CM | POA: Insufficient documentation

## 2012-10-31 DIAGNOSIS — E539 Vitamin B deficiency, unspecified: Secondary | ICD-10-CM

## 2012-11-21 NOTE — Progress Notes (Signed)
PT evaluation addendum Late entry for 11/25/2011 - G-codes Margie Rashika Bettes entered for Edwyna Perfect based on chart review.   Nov 25, 2011 0801  PT G-Codes **NOT FOR INPATIENT CLASS**  Functional Assessment Tool Used clinical judgement  Functional Limitation Mobility: Walking and moving around  Mobility: Walking and Moving Around Current Status (M8413) CI  Mobility: Walking and Moving Around Goal Status (K4401) CI  PT General Charges  $$ ACUTE PT VISIT 1 Procedure  11/21/2012 Corlis Hove, PT [phone]

## 2013-11-13 DIAGNOSIS — R300 Dysuria: Secondary | ICD-10-CM | POA: Insufficient documentation

## 2013-12-20 ENCOUNTER — Other Ambulatory Visit (HOSPITAL_COMMUNITY): Payer: Self-pay | Admitting: Orthopaedic Surgery

## 2013-12-29 ENCOUNTER — Other Ambulatory Visit (HOSPITAL_COMMUNITY): Payer: Self-pay | Admitting: *Deleted

## 2013-12-29 NOTE — Patient Instructions (Addendum)
Holly Valenzuela  12/29/2013                           YOUR PROCEDURE IS SCHEDULED ON: 01/12/14 AT 10:10 AM               ENTER THRU [place] MAIN HOSPITAL ENTRANCE AND                            FOLLOW  SIGNS TO SHORT STAY CENTER                 ARRIVE AT SHORT STAY AT: 8:10 AM               CALL THIS NUMBER IF ANY PROBLEMS THE DAY OF SURGERY :               [phone]                                REMEMBER:   Do not eat food or drink liquids AFTER MIDNIGHT                  Take these medicines the morning of surgery with               A SIPS OF WATER :  NONE       Do not wear jewelry, make-up   Do not wear lotions, powders, or perfumes.   Do not shave legs or underarms 12 hrs. before surgery (men may shave face)  Do not bring valuables to the hospital.  Contacts, dentures or bridgework may not be worn into surgery.  Leave suitcase in the car. After surgery it may be brought to your room.  For patients admitted to the hospital more than one night, checkout time is            11:00 AM                                                      ________________________________________________________________________                [place]  Before surgery, you can play an important role.  Because skin is not sterile, your skin needs to be as free of germs as possible.  You can reduce the number of germs on your skin by washing with CHG (chlorahexidine gluconate) soap before surgery.  CHG is an antiseptic cleaner which kills germs and bonds with the skin to continue killing germs even after washing. Please DO NOT use if you have an allergy to CHG or antibacterial soaps.  If your skin becomes reddened/irritated stop using the CHG and inform your nurse when you arrive at Short Stay. Do not shave (including legs and underarms) for at least 48 hours prior to the first CHG shower.  You may shave your face. Please follow these instructions  carefully:   1.  Shower with CHG Soap the night before surgery and the  morning of Surgery.   2.  If you choose to wash your hair, wash your hair first as usual with your  normal  Shampoo.   3.  After you shampoo, rinse  your hair and body thoroughly to remove the  shampoo.                                         4.  Use CHG as you would any other liquid soap.  You can apply chg directly  to the skin and wash . Gently wash with scrungie or clean wascloth    5.  Apply the CHG Soap to your body ONLY FROM THE NECK DOWN.   Do not use on open                           Wound or open sores. Avoid contact with eyes, ears mouth and genitals (private parts).                        Genitals (private parts) with your normal soap.              6.  Wash thoroughly, paying special attention to the area where your surgery  will be performed.   7.  Thoroughly rinse your body with warm water from the neck down.   8.  DO NOT shower/wash with your normal soap after using and rinsing off  the CHG Soap .                9.  Pat yourself dry with a clean towel.             10.  Wear clean pajamas.             11.  Place clean sheets on your bed the night of your first shower and do not  sleep with pets.  Day of Surgery : Do not apply any lotions/deodorants the morning of surgery.  Please wear clean clothes to the hospital/surgery center.  FAILURE TO FOLLOW THESE INSTRUCTIONS MAY RESULT IN THE CANCELLATION OF YOUR SURGERY    PATIENT SIGNATURE_________________________________  ______________________________________________________________________    WHAT IS A BLOOD TRANSFUSION? Blood Transfusion Information  A transfusion is the replacement of blood or some of its parts. Blood is made up of multiple cells which provide different functions.  Red blood cells carry oxygen and are used for blood loss replacement.  White blood cells fight against infection.  Platelets control bleeding.  Plasma  helps clot blood.  Other blood products are available for specialized needs, such as hemophilia or other clotting disorders. BEFORE THE TRANSFUSION  Who gives blood for transfusions?   Healthy volunteers who are fully evaluated to make sure their blood is safe. This is blood bank blood. Transfusion therapy is the safest it has ever been in the practice of medicine. Before blood is taken from a donor, a complete history is taken to make sure that person has no history of diseases nor engages in risky social behavior (examples are intravenous drug use or sexual activity with multiple partners). The donor's travel history is screened to minimize risk of transmitting infections, such as malaria. The donated blood is tested for signs of infectious diseases, such as HIV and hepatitis. The blood is then tested to be sure it is compatible with you in order to minimize the chance of a transfusion reaction. If you or a relative donates blood, this is often done in anticipation of surgery and is not appropriate for emergency situations. It takes many  days to process the donated blood. RISKS AND COMPLICATIONS Although transfusion therapy is very safe and saves many lives, the main dangers of transfusion include:   Getting an infectious disease.  Developing a transfusion reaction. This is an allergic reaction to something in the blood you were given. Every precaution is taken to prevent this. The decision to have a blood transfusion has been considered carefully by your caregiver before blood is given. Blood is not given unless the benefits outweigh the risks. AFTER THE TRANSFUSION  Right after receiving a blood transfusion, you will usually feel much better and more energetic. This is especially true if your red blood cells have gotten low (anemic). The transfusion raises the level of the red blood cells which carry oxygen, and this usually causes an energy increase.  The nurse administering the transfusion  will monitor you carefully for complications. HOME CARE INSTRUCTIONS  No special instructions are needed after a transfusion. You may find your energy is better. Speak with your caregiver about any limitations on activity for underlying diseases you may have. SEEK MEDICAL CARE IF:   Your condition is not improving after your transfusion.  You develop redness or irritation at the intravenous (IV) site. SEEK IMMEDIATE MEDICAL CARE IF:  Any of the following symptoms occur over the next 12 hours:  Shaking chills.  You have a temperature by mouth above 102 F (38.9 C), not controlled by medicine.  Chest, back, or muscle pain.  People around you feel you are not acting correctly or are confused.  Shortness of breath or difficulty breathing.  Dizziness and fainting.  You get a rash or develop hives.  You have a decrease in urine output.  Your urine turns a dark color or changes to pink, red, or brown. Any of the following symptoms occur over the next 10 days:  You have a temperature by mouth above 102 F (38.9 C), not controlled by medicine.  Shortness of breath.  Weakness after normal activity.  The white part of the eye turns yellow (jaundice).  You have a decrease in the amount of urine or are urinating less often.  Your urine turns a dark color or changes to pink, red, or brown. Document Released: 05/15/2000 Document Revised: 08/10/2011 Document Reviewed: 01/02/2008 [facility] Patient Information 2014 [place], [place].  _______________________________________________________________________

## 2014-01-01 ENCOUNTER — Encounter (HOSPITAL_COMMUNITY)
Admission: RE | Admit: 2014-01-01 | Discharge: 2014-01-01 | Disposition: A | Discharge status: Home / Self Care | Payer: Medicare Other | Source: Ambulatory Visit | Attending: Orthopaedic Surgery | Admitting: Orthopaedic Surgery

## 2014-01-01 ENCOUNTER — Encounter (HOSPITAL_COMMUNITY): Payer: Self-pay

## 2014-01-01 ENCOUNTER — Encounter (HOSPITAL_COMMUNITY): Payer: Self-pay | Admitting: Pharmacy Technician

## 2014-01-01 DIAGNOSIS — Z01818 Encounter for other preprocedural examination: Secondary | ICD-10-CM | POA: Insufficient documentation

## 2014-01-01 DIAGNOSIS — Z01812 Encounter for preprocedural laboratory examination: Secondary | ICD-10-CM | POA: Insufficient documentation

## 2014-01-01 HISTORY — DX: Adverse effect of unspecified anesthetic, initial encounter: T41.45XA

## 2014-01-01 HISTORY — DX: Headache: R51

## 2014-01-01 HISTORY — DX: Unspecified osteoarthritis, unspecified site: M19.90

## 2014-01-01 HISTORY — DX: Other complications of anesthesia, initial encounter: T88.59XA

## 2014-01-01 HISTORY — DX: Hyperlipidemia, unspecified: E78.5

## 2014-01-01 LAB — URINALYSIS, ROUTINE W REFLEX MICROSCOPIC
Bilirubin Urine: NEGATIVE
GLUCOSE, UA: NEGATIVE mg/dL
KETONES UR: NEGATIVE mg/dL
LEUKOCYTES UA: NEGATIVE
Nitrite: NEGATIVE
PROTEIN: NEGATIVE mg/dL
Specific Gravity, Urine: 1.007 (ref 1.005–1.030)
Urobilinogen, UA: 0.2 mg/dL (ref 0.0–1.0)
pH: 6 (ref 5.0–8.0)

## 2014-01-01 LAB — BASIC METABOLIC PANEL
Anion gap: 11 (ref 5–15)
BUN: 13 mg/dL (ref 6–23)
CALCIUM: 10.1 mg/dL (ref 8.4–10.5)
CO2: 28 mEq/L (ref 19–32)
CREATININE: 0.86 mg/dL (ref 0.50–1.10)
Chloride: 100 mEq/L (ref 96–112)
GFR, EST AFRICAN AMERICAN: 75 mL/min — AB (ref >90)
GFR, EST NON AFRICAN AMERICAN: 65 mL/min — AB (ref >90)
Glucose, Bld: 83 mg/dL (ref 70–99)
Potassium: 4.5 mEq/L (ref 3.7–5.3)
Sodium: 139 mEq/L (ref 137–147)

## 2014-01-01 LAB — CBC
HCT: 49.5 % — ABNORMAL HIGH (ref 36.0–46.0)
HEMOGLOBIN: 16.4 g/dL — AB (ref 12.0–15.0)
MCH: 33.3 pg (ref 26.0–34.0)
MCHC: 33.1 g/dL (ref 30.0–36.0)
MCV: 100.6 fL — ABNORMAL HIGH (ref 78.0–100.0)
PLATELETS: 238 10*3/uL (ref 150–400)
RBC: 4.92 MIL/uL (ref 3.87–5.11)
RDW: 13.2 % (ref 11.5–15.5)
WBC: 8.4 10*3/uL (ref 4.0–10.5)

## 2014-01-01 LAB — APTT: APTT: 32 s (ref 24–37)

## 2014-01-01 LAB — SURGICAL PCR SCREEN
MRSA, PCR: NEGATIVE
Staphylococcus aureus: NEGATIVE

## 2014-01-01 LAB — PROTIME-INR
INR: 0.9 (ref 0.00–1.49)
PROTHROMBIN TIME: 12.2 s (ref 11.6–15.2)

## 2014-01-01 LAB — URINE MICROSCOPIC-ADD ON

## 2014-01-11 NOTE — Anesthesia Preprocedure Evaluation (Addendum)
Anesthesia Evaluation  Patient identified by MRN, date of birth, ID band Patient awake    Reviewed: Allergy & Precautions, H&P , NPO status , Patient's Chart, lab work & pertinent test results  History of Anesthesia Complications (+) PONV  Airway Mallampati: II TM Distance: >3 FB Neck ROM: full    Dental  (+) Caps, Dental Advisory Given 4 upper front are capped:   Pulmonary Current Smoker,  breath sounds clear to auscultation  Pulmonary exam normal       Cardiovascular Exercise Tolerance: Good negative cardio ROS  Rhythm:regular Rate:Normal     Neuro/Psych negative neurological ROS  negative psych ROS   GI/Hepatic negative GI ROS, Neg liver ROS, GERD-  Medicated and Controlled,  Endo/Other  negative endocrine ROS  Renal/GU negative Renal ROS  negative genitourinary   Musculoskeletal   Abdominal   Peds  Hematology negative hematology ROS (+)   Anesthesia Other Findings   Reproductive/Obstetrics negative OB ROS                         Anesthesia Physical Anesthesia Plan  ASA: II  Anesthesia Plan: Spinal   Post-op Pain Management:    Induction:   Airway Management Planned: Simple Face Mask  Additional Equipment:   Intra-op Plan:   Post-operative Plan:   Informed Consent: I have reviewed the patients History and Physical, chart, labs and discussed the procedure including the risks, benefits and alternatives for the proposed anesthesia with the patient or authorized representative who has indicated his/her understanding and acceptance.   Dental Advisory Given  Plan Discussed with: CRNA and Surgeon  Anesthesia Plan Comments:        Anesthesia Quick Evaluation

## 2014-01-12 ENCOUNTER — Inpatient Hospital Stay (HOSPITAL_COMMUNITY): Payer: Medicare Other

## 2014-01-12 ENCOUNTER — Encounter (HOSPITAL_COMMUNITY): Payer: Medicare Other | Admitting: Anesthesiology

## 2014-01-12 ENCOUNTER — Encounter (HOSPITAL_COMMUNITY)
Admission: RE | Disposition: A | Discharge status: Skilled Nursing Facility | Payer: Self-pay | Source: Ambulatory Visit | Attending: Orthopaedic Surgery

## 2014-01-12 ENCOUNTER — Inpatient Hospital Stay (HOSPITAL_COMMUNITY)
Admission: RE | Admit: 2014-01-12 | Discharge: 2014-01-15 | DRG: 470 | Disposition: A | Discharge status: Skilled Nursing Facility | Payer: Medicare Other | Source: Ambulatory Visit | Attending: Orthopaedic Surgery | Admitting: Orthopaedic Surgery

## 2014-01-12 ENCOUNTER — Encounter (HOSPITAL_COMMUNITY): Payer: Self-pay | Admitting: *Deleted

## 2014-01-12 ENCOUNTER — Inpatient Hospital Stay (HOSPITAL_COMMUNITY): Payer: Medicare Other | Admitting: Anesthesiology

## 2014-01-12 DIAGNOSIS — Z01812 Encounter for preprocedural laboratory examination: Secondary | ICD-10-CM

## 2014-01-12 DIAGNOSIS — E785 Hyperlipidemia, unspecified: Secondary | ICD-10-CM | POA: Diagnosis present

## 2014-01-12 DIAGNOSIS — Z96641 Presence of right artificial hip joint: Secondary | ICD-10-CM

## 2014-01-12 DIAGNOSIS — F329 Major depressive disorder, single episode, unspecified: Secondary | ICD-10-CM | POA: Diagnosis not present

## 2014-01-12 DIAGNOSIS — Z681 Body mass index (BMI) 19 or less, adult: Secondary | ICD-10-CM | POA: Diagnosis not present

## 2014-01-12 DIAGNOSIS — F17200 Nicotine dependence, unspecified, uncomplicated: Secondary | ICD-10-CM | POA: Diagnosis present

## 2014-01-12 DIAGNOSIS — M1611 Unilateral primary osteoarthritis, right hip: Secondary | ICD-10-CM

## 2014-01-12 DIAGNOSIS — M1610 Unilateral primary osteoarthritis, unspecified hip: Principal | ICD-10-CM | POA: Diagnosis present

## 2014-01-12 DIAGNOSIS — K219 Gastro-esophageal reflux disease without esophagitis: Secondary | ICD-10-CM | POA: Diagnosis present

## 2014-01-12 DIAGNOSIS — M25559 Pain in unspecified hip: Secondary | ICD-10-CM | POA: Diagnosis present

## 2014-01-12 DIAGNOSIS — F3289 Other specified depressive episodes: Secondary | ICD-10-CM | POA: Diagnosis present

## 2014-01-12 DIAGNOSIS — Z8542 Personal history of malignant neoplasm of other parts of uterus: Secondary | ICD-10-CM

## 2014-01-12 DIAGNOSIS — M169 Osteoarthritis of hip, unspecified: Principal | ICD-10-CM | POA: Diagnosis present

## 2014-01-12 HISTORY — PX: TOTAL HIP ARTHROPLASTY: SHX124

## 2014-01-12 LAB — TYPE AND SCREEN
ABO/RH(D): O POS
Antibody Screen: NEGATIVE

## 2014-01-12 LAB — ABO/RH: ABO/RH(D): O POS

## 2014-01-12 SURGERY — ARTHROPLASTY, HIP, TOTAL, ANTERIOR APPROACH
Anesthesia: Spinal | Site: Hip | Laterality: Right

## 2014-01-12 MED ORDER — LIDOCAINE HCL (CARDIAC) 20 MG/ML IV SOLN
INTRAVENOUS | Status: AC
  Filled 2014-01-12: qty 5

## 2014-01-12 MED ORDER — ONDANSETRON HCL 4 MG/2ML IJ SOLN
4.0000 mg | Freq: Four times a day (QID) | INTRAMUSCULAR | Status: DC | PRN
  Administered 2014-01-13 (×3): 4 mg via INTRAVENOUS
  Filled 2014-01-12 (×3): qty 2

## 2014-01-12 MED ORDER — SODIUM CHLORIDE 0.9 % IV SOLN
INTRAVENOUS | Status: DC | PRN
  Administered 2014-01-12: 1000 mL

## 2014-01-12 MED ORDER — POLYETHYLENE GLYCOL 3350 17 G PO PACK
17.0000 g | PACK | Freq: Every day | ORAL | Status: DC | PRN
Start: 2014-01-12 — End: 2014-01-15

## 2014-01-12 MED ORDER — DEXAMETHASONE SODIUM PHOSPHATE 10 MG/ML IJ SOLN
INTRAMUSCULAR | Status: AC
  Filled 2014-01-12: qty 1

## 2014-01-12 MED ORDER — BUPIVACAINE HCL (PF) 0.5 % IJ SOLN
INTRAMUSCULAR | Status: AC
  Filled 2014-01-12: qty 30

## 2014-01-12 MED ORDER — PROPOFOL 10 MG/ML IV BOLUS
INTRAVENOUS | Status: AC
  Filled 2014-01-12: qty 20

## 2014-01-12 MED ORDER — ALPRAZOLAM 0.25 MG PO TABS
0.2500 mg | ORAL_TABLET | Freq: Three times a day (TID) | ORAL | Status: DC | PRN
  Administered 2014-01-12 – 2014-01-14 (×5): 0.25 mg via ORAL
  Filled 2014-01-12 (×5): qty 1

## 2014-01-12 MED ORDER — PHENYLEPHRINE 40 MCG/ML (10ML) SYRINGE FOR IV PUSH (FOR BLOOD PRESSURE SUPPORT)
PREFILLED_SYRINGE | INTRAVENOUS | Status: AC
  Filled 2014-01-12: qty 10

## 2014-01-12 MED ORDER — DIPHENHYDRAMINE HCL 12.5 MG/5ML PO ELIX: 12.5000 mg | ORAL_SOLUTION | ORAL | Status: DC | PRN

## 2014-01-12 MED ORDER — EPHEDRINE SULFATE 50 MG/ML IJ SOLN
INTRAMUSCULAR | Status: AC
  Filled 2014-01-12: qty 1

## 2014-01-12 MED ORDER — FENTANYL CITRATE 0.05 MG/ML IJ SOLN
INTRAMUSCULAR | Status: AC
  Filled 2014-01-12: qty 2

## 2014-01-12 MED ORDER — MIDAZOLAM HCL 5 MG/5ML IJ SOLN
INTRAMUSCULAR | Status: DC | PRN
  Administered 2014-01-12 (×2): 1 mg via INTRAVENOUS

## 2014-01-12 MED ORDER — ONDANSETRON HCL 4 MG/2ML IJ SOLN
INTRAMUSCULAR | Status: DC | PRN
  Administered 2014-01-12: 4 mg via INTRAVENOUS

## 2014-01-12 MED ORDER — ATORVASTATIN CALCIUM 10 MG PO TABS
10.0000 mg | ORAL_TABLET | Freq: Every day | ORAL | Status: DC
  Administered 2014-01-12 – 2014-01-15 (×4): 10 mg via ORAL
  Filled 2014-01-12 (×4): qty 1

## 2014-01-12 MED ORDER — METHOCARBAMOL 500 MG PO TABS
500.0000 mg | ORAL_TABLET | Freq: Four times a day (QID) | ORAL | Status: DC | PRN
Start: 2014-01-12 — End: 2014-01-15
  Administered 2014-01-12 – 2014-01-15 (×5): 500 mg via ORAL
  Filled 2014-01-12 (×5): qty 1

## 2014-01-12 MED ORDER — ATROPINE SULFATE 0.4 MG/ML IJ SOLN
INTRAMUSCULAR | Status: AC
  Filled 2014-01-12: qty 1

## 2014-01-12 MED ORDER — ACETAMINOPHEN 650 MG RE SUPP: 650.0000 mg | Freq: Four times a day (QID) | RECTAL | Status: DC | PRN

## 2014-01-12 MED ORDER — ALUM & MAG HYDROXIDE-SIMETH 200-200-20 MG/5ML PO SUSP
30.0000 mL | ORAL | Status: DC | PRN
  Administered 2014-01-13 – 2014-01-14 (×4): 30 mL via ORAL
  Filled 2014-01-12 (×4): qty 30

## 2014-01-12 MED ORDER — MORPHINE SULFATE 2 MG/ML IJ SOLN
1.0000 mg | INTRAMUSCULAR | Status: DC | PRN
  Administered 2014-01-12 (×6): 1 mg via INTRAVENOUS
  Filled 2014-01-12 (×6): qty 1

## 2014-01-12 MED ORDER — SODIUM CHLORIDE 0.9 % IJ SOLN
INTRAMUSCULAR | Status: AC
Start: 2014-01-12 — End: 2014-01-12
  Filled 2014-01-12: qty 10

## 2014-01-12 MED ORDER — ASPIRIN EC 325 MG PO TBEC
325.0000 mg | DELAYED_RELEASE_TABLET | Freq: Two times a day (BID) | ORAL | Status: DC
  Administered 2014-01-12 – 2014-01-15 (×6): 325 mg via ORAL
  Filled 2014-01-12 (×8): qty 1

## 2014-01-12 MED ORDER — NICOTINE 14 MG/24HR TD PT24
14.0000 mg | MEDICATED_PATCH | Freq: Every day | TRANSDERMAL | Status: DC
  Administered 2014-01-12 – 2014-01-15 (×4): 14 mg via TRANSDERMAL
  Filled 2014-01-12 (×4): qty 1

## 2014-01-12 MED ORDER — VITAMIN D3 25 MCG (1000 UNIT) PO TABS
1000.0000 [IU] | ORAL_TABLET | Freq: Every day | ORAL | Status: DC
  Administered 2014-01-12 – 2014-01-15 (×4): 1000 [IU] via ORAL
  Filled 2014-01-12 (×4): qty 1

## 2014-01-12 MED ORDER — LACTATED RINGERS IV SOLN: INTRAVENOUS | Status: DC

## 2014-01-12 MED ORDER — CEFAZOLIN SODIUM-DEXTROSE 2-3 GM-% IV SOLR
INTRAVENOUS | Status: AC
  Filled 2014-01-12: qty 50

## 2014-01-12 MED ORDER — PHENYLEPHRINE HCL 10 MG/ML IJ SOLN
INTRAMUSCULAR | Status: DC | PRN
  Administered 2014-01-12: 40 ug via INTRAVENOUS
  Administered 2014-01-12: 80 ug via INTRAVENOUS
  Administered 2014-01-12 (×2): 40 ug via INTRAVENOUS
  Administered 2014-01-12: 80 ug via INTRAVENOUS

## 2014-01-12 MED ORDER — ZOLPIDEM TARTRATE 5 MG PO TABS: 5.0000 mg | ORAL_TABLET | Freq: Every evening | ORAL | Status: DC | PRN

## 2014-01-12 MED ORDER — ADULT MULTIVITAMIN W/MINERALS CH
1.0000 | ORAL_TABLET | Freq: Every day | ORAL | Status: DC
  Administered 2014-01-12 – 2014-01-15 (×4): 1 via ORAL
  Filled 2014-01-12 (×4): qty 1

## 2014-01-12 MED ORDER — MENTHOL 3 MG MT LOZG
1.0000 | LOZENGE | OROMUCOSAL | Status: DC | PRN
  Filled 2014-01-12: qty 9

## 2014-01-12 MED ORDER — ONDANSETRON HCL 4 MG/2ML IJ SOLN
INTRAMUSCULAR | Status: AC
  Filled 2014-01-12: qty 2

## 2014-01-12 MED ORDER — PHENYLEPHRINE HCL 10 MG/ML IJ SOLN
INTRAMUSCULAR | Status: AC
  Filled 2014-01-12: qty 1

## 2014-01-12 MED ORDER — OXYCODONE HCL 5 MG PO TABS
5.0000 mg | ORAL_TABLET | ORAL | Status: DC | PRN
Start: 2014-01-12 — End: 2014-01-15
  Administered 2014-01-12 – 2014-01-15 (×13): 10 mg via ORAL
  Filled 2014-01-12 (×13): qty 2

## 2014-01-12 MED ORDER — LACTATED RINGERS IV SOLN
INTRAVENOUS | Status: DC
  Administered 2014-01-12: 10:00:00 via INTRAVENOUS
  Administered 2014-01-12: 1000 mL via INTRAVENOUS

## 2014-01-12 MED ORDER — PHENOL 1.4 % MT LIQD
1.0000 | OROMUCOSAL | Status: DC | PRN
  Filled 2014-01-12: qty 177

## 2014-01-12 MED ORDER — SODIUM CHLORIDE 0.9 % IV SOLN
INTRAVENOUS | Status: DC
  Administered 2014-01-12 – 2014-01-13 (×3): via INTRAVENOUS

## 2014-01-12 MED ORDER — DOCUSATE SODIUM 100 MG PO CAPS
100.0000 mg | ORAL_CAPSULE | Freq: Two times a day (BID) | ORAL | Status: DC
  Administered 2014-01-12 – 2014-01-15 (×6): 100 mg via ORAL

## 2014-01-12 MED ORDER — METOCLOPRAMIDE HCL 10 MG PO TABS
5.0000 mg | ORAL_TABLET | Freq: Three times a day (TID) | ORAL | Status: DC | PRN
  Administered 2014-01-14: 10 mg via ORAL
  Filled 2014-01-12: qty 1

## 2014-01-12 MED ORDER — MIDAZOLAM HCL 2 MG/2ML IJ SOLN
INTRAMUSCULAR | Status: AC
  Filled 2014-01-12: qty 2

## 2014-01-12 MED ORDER — CEFAZOLIN SODIUM 1-5 GM-% IV SOLN
1.0000 g | Freq: Four times a day (QID) | INTRAVENOUS | Status: AC
  Administered 2014-01-12 (×2): 1 g via INTRAVENOUS
  Filled 2014-01-12 (×2): qty 50

## 2014-01-12 MED ORDER — MORPHINE SULFATE 2 MG/ML IJ SOLN
2.0000 mg | INTRAMUSCULAR | Status: DC | PRN
  Administered 2014-01-12: 2 mg via INTRAVENOUS
  Filled 2014-01-12: qty 1

## 2014-01-12 MED ORDER — ONDANSETRON HCL 4 MG PO TABS
4.0000 mg | ORAL_TABLET | Freq: Four times a day (QID) | ORAL | Status: DC | PRN
  Administered 2014-01-13 – 2014-01-15 (×2): 4 mg via ORAL
  Filled 2014-01-12 (×2): qty 1

## 2014-01-12 MED ORDER — DEXTROSE 5 % IV SOLN
500.0000 mg | Freq: Four times a day (QID) | INTRAVENOUS | Status: DC | PRN
  Administered 2014-01-12: 500 mg via INTRAVENOUS
  Filled 2014-01-12: qty 5

## 2014-01-12 MED ORDER — PROPOFOL INFUSION 10 MG/ML OPTIME
INTRAVENOUS | Status: DC | PRN
  Administered 2014-01-12: 25 ug/kg/min via INTRAVENOUS

## 2014-01-12 MED ORDER — PROPOFOL 10 MG/ML IV BOLUS
INTRAVENOUS | Status: DC | PRN
  Administered 2014-01-12 (×2): 20 mg via INTRAVENOUS
  Administered 2014-01-12: 10 mg via INTRAVENOUS
  Administered 2014-01-12: 20 mg via INTRAVENOUS

## 2014-01-12 MED ORDER — FENTANYL CITRATE 0.05 MG/ML IJ SOLN: 25.0000 ug | INTRAMUSCULAR | Status: DC | PRN

## 2014-01-12 MED ORDER — METOCLOPRAMIDE HCL 5 MG/ML IJ SOLN
5.0000 mg | Freq: Three times a day (TID) | INTRAMUSCULAR | Status: DC | PRN
  Administered 2014-01-13: 5 mg via INTRAVENOUS
  Filled 2014-01-12: qty 2

## 2014-01-12 MED ORDER — TRAMADOL HCL 50 MG PO TABS: 100.0000 mg | ORAL_TABLET | Freq: Four times a day (QID) | ORAL | Status: DC | PRN

## 2014-01-12 MED ORDER — FENTANYL CITRATE 0.05 MG/ML IJ SOLN
INTRAMUSCULAR | Status: DC | PRN
  Administered 2014-01-12: 50 ug via INTRAVENOUS

## 2014-01-12 MED ORDER — ACETAMINOPHEN 325 MG PO TABS
650.0000 mg | ORAL_TABLET | Freq: Four times a day (QID) | ORAL | Status: DC | PRN
  Administered 2014-01-13 – 2014-01-14 (×3): 650 mg via ORAL
  Filled 2014-01-12 (×3): qty 2

## 2014-01-12 MED ORDER — PANTOPRAZOLE SODIUM 40 MG PO TBEC
40.0000 mg | DELAYED_RELEASE_TABLET | Freq: Every day | ORAL | Status: DC
  Administered 2014-01-12 – 2014-01-15 (×4): 40 mg via ORAL
  Filled 2014-01-12 (×4): qty 1

## 2014-01-12 MED ORDER — TRANEXAMIC ACID 100 MG/ML IV SOLN
1000.0000 mg | INTRAVENOUS | Status: AC
  Administered 2014-01-12: 1000 mg via INTRAVENOUS
  Filled 2014-01-12: qty 10

## 2014-01-12 MED ORDER — CEFAZOLIN SODIUM-DEXTROSE 2-3 GM-% IV SOLR
2.0000 g | INTRAVENOUS | Status: AC
  Administered 2014-01-12: 2 g via INTRAVENOUS

## 2014-01-12 MED ORDER — FLUTICASONE PROPIONATE 50 MCG/ACT NA SUSP
1.0000 | Freq: Every day | NASAL | Status: DC | PRN
  Filled 2014-01-12: qty 16

## 2014-01-12 MED ORDER — ACETAMINOPHEN 10 MG/ML IV SOLN
1000.0000 mg | Freq: Once | INTRAVENOUS | Status: AC
  Administered 2014-01-12: 1000 mg via INTRAVENOUS
  Filled 2014-01-12: qty 100

## 2014-01-12 MED ORDER — DEXAMETHASONE SODIUM PHOSPHATE 10 MG/ML IJ SOLN
INTRAMUSCULAR | Status: DC | PRN
  Administered 2014-01-12: 5 mg via INTRAVENOUS

## 2014-01-12 SURGICAL SUPPLY — 40 items
APL SKNCLS STERI-STRIP NONHPOA (GAUZE/BANDAGES/DRESSINGS) ×1
BAG SPEC THK2 15X12 ZIP CLS (MISCELLANEOUS)
BAG ZIPLOCK 12X15 (MISCELLANEOUS) IMPLANT
BENZOIN TINCTURE PRP APPL 2/3 (GAUZE/BANDAGES/DRESSINGS) ×1 IMPLANT
BLADE SAW SGTL 18X1.27X75 (BLADE) ×2 IMPLANT
CAPT HIP PF COP ×1 IMPLANT
CELLS DAT CNTRL 66122 CELL SVR (MISCELLANEOUS) ×1 IMPLANT
COVER PERINEAL POST (MISCELLANEOUS) ×2 IMPLANT
DRAPE C-ARM 42X120 X-RAY (DRAPES) ×2 IMPLANT
DRAPE STERI IOBAN 125X83 (DRAPES) ×2 IMPLANT
DRAPE U-SHAPE 47X51 STRL (DRAPES) ×6 IMPLANT
DRSG AQUACEL AG ADV 3.5X10 (GAUZE/BANDAGES/DRESSINGS) ×2 IMPLANT
DURAPREP 26ML APPLICATOR (WOUND CARE) ×2 IMPLANT
ELECT BLADE TIP CTD 4 INCH (ELECTRODE) ×2 IMPLANT
ELECT REM PT RETURN 9FT ADLT (ELECTROSURGICAL) ×2
ELECTRODE REM PT RTRN 9FT ADLT (ELECTROSURGICAL) ×1 IMPLANT
FACESHIELD WRAPAROUND (MASK) ×8 IMPLANT
GAUZE XEROFORM 1X8 LF (GAUZE/BANDAGES/DRESSINGS) ×1 IMPLANT
GLOVE BIO SURGEON STRL SZ7.5 (GLOVE) ×2 IMPLANT
GLOVE BIOGEL PI IND STRL 8 (GLOVE) ×2 IMPLANT
GLOVE BIOGEL PI INDICATOR 8 (GLOVE) ×2
GLOVE ECLIPSE 8.0 STRL XLNG CF (GLOVE) ×2 IMPLANT
GOWN STRL REUS W/TWL XL LVL3 (GOWN DISPOSABLE) ×4 IMPLANT
HANDPIECE INTERPULSE COAX TIP (DISPOSABLE) ×2
KIT BASIN OR (CUSTOM PROCEDURE TRAY) ×2 IMPLANT
PACK TOTAL JOINT (CUSTOM PROCEDURE TRAY) ×2 IMPLANT
RETRACTOR WND ALEXIS 18 MED (MISCELLANEOUS) ×1 IMPLANT
RTRCTR WOUND ALEXIS 18CM MED (MISCELLANEOUS) ×2
SET HNDPC FAN SPRY TIP SCT (DISPOSABLE) ×1 IMPLANT
STAPLER VISISTAT 35W (STAPLE) IMPLANT
STRIP CLOSURE SKIN 1/2X4 (GAUZE/BANDAGES/DRESSINGS) ×2 IMPLANT
SUT ETHIBOND NAB CT1 #1 30IN (SUTURE) ×2 IMPLANT
SUT MNCRL AB 4-0 PS2 18 (SUTURE) ×1 IMPLANT
SUT VIC AB 0 CT1 36 (SUTURE) ×2 IMPLANT
SUT VIC AB 1 CT1 36 (SUTURE) ×2 IMPLANT
SUT VIC AB 2-0 CT1 27 (SUTURE) ×4
SUT VIC AB 2-0 CT1 TAPERPNT 27 (SUTURE) ×2 IMPLANT
TOWEL OR 17X26 10 PK STRL BLUE (TOWEL DISPOSABLE) ×2 IMPLANT
TOWEL OR NON WOVEN STRL DISP B (DISPOSABLE) ×2 IMPLANT
TRAY FOLEY CATH 14FRSI W/METER (CATHETERS) ×1 IMPLANT

## 2014-01-12 NOTE — Progress Notes (Signed)
Patient and daughter is concerned about patient having the necessary medication at [facility] upon discharge. Patient daughter's phone number is in Sticky Notes. Daughter is requesting the Social Worker to call her tomorrow.

## 2014-01-12 NOTE — Evaluation (Signed)
Physical Therapy Evaluation Patient Details Name: Holly Valenzuela MRN: [id number] DOB: April 20, 1941 Today's Date: 01/12/2014   History of Present Illness  R THR  Clinical Impression  Pt s/p R THR presents with decreased R LE strength/ROM and post op pain limiting functional mobility.  Pt would benefit from follow up rehab at SNF level to maximize IND/saftey prior to return home with ltd assist.    Follow Up Recommendations SNF    Equipment Recommendations  Rolling walker with 5" wheels    Recommendations for Other Services OT consult     Precautions / Restrictions Precautions Precautions: Fall Restrictions Weight Bearing Restrictions: No Other Position/Activity Restrictions: WBAT      Mobility  Bed Mobility Overal bed mobility: Needs Assistance Bed Mobility: Supine to Sit     Supine to sit: Min assist;Mod assist     General bed mobility comments: cues for sequence and use of L LE to self assist  Transfers Overall transfer level: Needs assistance Equipment used: Rolling walker (2 wheeled) Transfers: Sit to/from Stand Sit to Stand: Mod assist         General transfer comment: cues for LE management and use of UEs to self assist  Ambulation/Gait Ambulation/Gait assistance: Min assist;Mod assist Ambulation Distance (Feet): 90 Feet Assistive device: Rolling walker (2 wheeled) Gait Pattern/deviations: Step-to pattern;Step-through pattern;Decreased step length - right;Decreased step length - left;Shuffle;Antalgic;Trunk flexed     General Gait Details: cues for posture, position from RW and initial sequence  Stairs            Wheelchair Mobility    Modified Rankin (Stroke Patients Only)       Balance                                             Pertinent Vitals/Pain Pain Assessment: 0-10 Pain Score: 5  Pain Location: R hip Pain Descriptors / Indicators: Aching Pain Intervention(s): Limited activity within patient's  tolerance;Monitored during session;Premedicated before session;Patient requesting pain meds-RN notified;Ice applied    Home Living Family/patient expects to be discharged to:: Skilled nursing facility Living Arrangements: Alone                    Prior Function Level of Independence: Independent with assistive device(s)         Comments: Pt struggling with meals and self care 2* hip pain      Hand Dominance   Dominant Hand: Left    Extremity/Trunk Assessment   Upper Extremity Assessment: Overall WFL for tasks assessed           Lower Extremity Assessment: RLE deficits/detail RLE Deficits / Details: 2+/5 hip strength with AAROM at hip to 80 flex and 15 abd    Cervical / Trunk Assessment: Normal  Communication   Communication: No difficulties  Cognition Arousal/Alertness: Awake/alert Behavior During Therapy: WFL for tasks assessed/performed Overall Cognitive Status: Within Functional Limits for tasks assessed                      General Comments      Exercises Total Joint Exercises Ankle Circles/Pumps: AROM;Both;15 reps;Supine Quad Sets: AROM;Both;10 reps;Supine Heel Slides: AAROM;Right;15 reps;Supine Hip ABduction/ADduction: AAROM;Right;10 reps;Supine      Assessment/Plan    PT Assessment Patient needs continued PT services  PT Diagnosis Difficulty walking   PT Problem List Decreased strength;Decreased range of motion;Decreased activity  tolerance;Decreased mobility;Decreased knowledge of use of DME;Pain  PT Treatment Interventions DME instruction;Gait training;Stair training;Functional mobility training;Therapeutic activities;Therapeutic exercise;Patient/family education   PT Goals (Current goals can be found in the Care Plan section) Acute Rehab PT Goals Patient Stated Goal: Walk and move without pain PT Goal Formulation: With patient Time For Goal Achievement: 01/19/14 Potential to Achieve Goals: Good    Frequency 7X/week    Barriers to discharge        Co-evaluation               End of Session Equipment Utilized During Treatment: Gait belt Activity Tolerance: Patient tolerated treatment well Patient left: in chair;with call bell/phone within reach Nurse Communication: Mobility status         Time: 2836-6294 PT Time Calculation (min): 33 min   Charges:   PT Evaluation $Initial PT Evaluation Tier I: 1 Procedure PT Treatments $Gait Training: 8-22 mins $Therapeutic Exercise: 8-22 mins   PT G Codes:          Lanna Labella 01/12/2014, 5:20 PM

## 2014-01-12 NOTE — Anesthesia Postprocedure Evaluation (Signed)
  Anesthesia Post-op Note  Patient: Holly Valenzuela  Procedure(s) Performed: Procedure(s) (LRB): RIGHT TOTAL HIP ARTHROPLASTY ANTERIOR APPROACH (Right)  Patient Location: PACU  Anesthesia Type: Spinal  Level of Consciousness: awake and alert   Airway and Oxygen Therapy: Patient Spontanous Breathing  Post-op Pain: mild  Post-op Assessment: Post-op Vital signs reviewed, Patient's Cardiovascular Status Stable, Respiratory Function Stable, Patent Airway and No signs of Nausea or vomiting  Last Vitals:  Filed Vitals:   01/12/14 1215  BP: 109/66  Pulse:   Temp: 36.3 C  Resp:     Post-op Vital Signs: stable   Complications: No apparent anesthesia complications

## 2014-01-12 NOTE — Progress Notes (Signed)
Pt is noted to have discoloration on rt hip/thigh area, she states it is from using the heating pad continiuously.  She also has two bandaids on top of knee that she states the "burn abrasion" are from the heating pad.  Bandaids are clean and intact.

## 2014-01-12 NOTE — Transfer of Care (Signed)
Immediate Anesthesia Transfer of Care Note  Patient: Holly Valenzuela  Procedure(s) Performed: Procedure(s) (LRB): RIGHT TOTAL HIP ARTHROPLASTY ANTERIOR APPROACH (Right)  Patient Location: PACU  Anesthesia Type: Spinal  Level of Consciousness: sedated, patient cooperative and responds to stimulation  Airway & Oxygen Therapy: Patient Spontanous Breathing and Patient connected to face mask oxgen  Post-op Assessment: Report given to PACU RN and Post -op Vital signs reviewed and stable  Post vital signs: Reviewed and stable  Complications: No apparent anesthesia complications

## 2014-01-12 NOTE — H&P (Signed)
TOTAL HIP ADMISSION H&P  Patient is admitted for right total hip arthroplasty.  Subjective:  Chief Complaint: right hip pain  HPI: Holly Valenzuela, 73 y.o. female, has a history of pain and functional disability in the right hip(s) due to arthritis and patient has failed non-surgical conservative treatments for greater than 12 weeks to include NSAID's and/or analgesics, corticosteriod injections, use of assistive devices and activity modification.  Onset of symptoms was gradual starting 3 years ago with gradually worsening course since that time.The patient noted no past surgery on the right hip(s).  Patient currently rates pain in the right hip at 10 out of 10 with activity. Patient has night pain, worsening of pain with activity and weight bearing, trendelenberg gait, pain that interfers with activities of daily living, pain with passive range of motion and crepitus. Patient has evidence of subchondral sclerosis, periarticular osteophytes and joint space narrowing by imaging studies. This condition presents safety issues increasing the risk of falls.  There is no current active infection.  Patient Active Problem List   Diagnosis Date Noted  . Arthritis of right hip 01/12/2014  . Syncope 11/21/2011  . Ribs, multiple fractures 11/21/2011  . Leukocytosis 11/21/2011  . Depression 11/21/2011   Past Medical History  Diagnosis Date  . Complication of anesthesia     SEVERE nausea vomiting  . Hyperlipidemia   . Headache(784.0)     HX MIGRAINES  . Arthritis   . Cancer     HX ENDOMETRIAL CANCER   . Depression     Past Surgical History  Procedure Laterality Date  . Cesarean section    . Appendectomy    . Thyroid surgery    . Abdominal hysterectomy      No prescriptions prior to admission   Allergies  Allergen Reactions  . Dilaudid [Hydromorphone Hcl] Nausea And Vomiting  . Codeine Nausea And Vomiting    History  Substance Use Topics  . Smoking status: Current Every Day  Smoker    Types: Cigarettes  . Smokeless tobacco: Never Used  . Alcohol Use: No    No family history on file.   Review of Systems  Musculoskeletal: Positive for joint pain.  All other systems reviewed and are negative.   Objective:  Physical Exam  Constitutional: She is oriented to person, place, and time. She appears well-developed and well-nourished.  HENT:  Head: Normocephalic and atraumatic.  Eyes: EOM are normal. Pupils are equal, round, and reactive to light.  Neck: Normal range of motion. Neck supple.  Cardiovascular: Normal rate and regular rhythm.   Respiratory: Effort normal and breath sounds normal.  GI: Soft. Bowel sounds are normal.  Musculoskeletal:       Right hip: She exhibits decreased range of motion, decreased strength, bony tenderness and crepitus.  Neurological: She is alert and oriented to person, place, and time.  Skin: Skin is warm and dry.  Psychiatric: She has a normal mood and affect.    Vital signs in last 24 hours:    Labs:   Estimated body mass index is 16.41 kg/(m^2) as calculated from the following:   Height as of 11/21/11: 5\' 6"  (1.676 m).   Weight as of 11/21/11: 46.085 kg (101 lb 9.6 oz).   Imaging Review Plain radiographs demonstrate severe degenerative joint disease of the right hip(s). The bone quality appears to be good for age and reported activity level.  Assessment/Plan:  End stage arthritis, right hip(s)  The patient history, physical examination, clinical judgement of the provider  and imaging studies are consistent with end stage degenerative joint disease of the right hip(s) and total hip arthroplasty is deemed medically necessary. The treatment options including medical management, injection therapy, arthroscopy and arthroplasty were discussed at length. The risks and benefits of total hip arthroplasty were presented and reviewed. The risks due to aseptic loosening, infection, stiffness, dislocation/subluxation,   thromboembolic complications and other imponderables were discussed.  The patient acknowledged the explanation, agreed to proceed with the plan and consent was signed. Patient is being admitted for inpatient treatment for surgery, pain control, PT, OT, prophylactic antibiotics, VTE prophylaxis, progressive ambulation and ADL's and discharge planning.The patient is planning to be discharged to skilled nursing facility

## 2014-01-12 NOTE — Progress Notes (Signed)
CARE MANAGEMENT NOTE 01/12/2014  Patient:  Holly Valenzuela, Holly Valenzuela   Account Number:  [id number]  Date Initiated:  01/12/2014  Documentation initiated by:  Kamon Fahr  Subjective/Objective Assessment:   rt total hip placement     Action/Plan:   home versus snf placement   Anticipated DC Date:  01/15/2014   Anticipated DC Plan:  SKILLED NURSING FACILITY  In-house referral  Clinical Social Worker      DC Planning Services  CM consult      [facility] Choice  NA   Choice offered to / List presented to:             Status of service:  In process, will continue to follow Medicare Important Message given?  NA - LOS <3 / Initial given by admissions (If response is "NO", the following Medicare IM given date fields will be blank) Date Medicare IM given:   Medicare IM given by:   Date Additional Medicare IM given:   Additional Medicare IM given by:    Discharge Disposition:    Per UR Regulation:  Reviewed for med. necessity/level of care/duration of stay  If discussed at [place] of Stay Meetings, dates discussed:    Comments:  Suanne Marker Annelisa Ryback,RN,BSN,CCM

## 2014-01-12 NOTE — Brief Op Note (Signed)
01/12/2014  10:43 AM  PATIENT:  Holly Valenzuela  73 y.o. female  PRE-OPERATIVE DIAGNOSIS:  osteoarthritis right hip  POST-OPERATIVE DIAGNOSIS:  osteoarthritis right hip  PROCEDURE:  Procedure(s): RIGHT TOTAL HIP ARTHROPLASTY ANTERIOR APPROACH (Right)  SURGEON:  Surgeon(s) and Role:    * Mcarthur Rossetti, MD - Primary  PHYSICIAN ASSISTANT: Benita Stabile, PA-C  ANESTHESIA:   spinal  EBL:  Total I/O In: 1000 [I.V.:1000] Out: 350 [Urine:200; Blood:150]  BLOOD ADMINISTERED:none  DRAINS: none   LOCAL MEDICATIONS USED:  NONE  SPECIMEN:  No Specimen  DISPOSITION OF SPECIMEN:  N/A  COUNTS:  YES  TOURNIQUET:  * No tourniquets in log *  DICTATION: .Other Dictation: Dictation Number [id number]  PLAN OF CARE: Admit to inpatient   PATIENT DISPOSITION:  PACU - hemodynamically stable.   Delay start of Pharmacological VTE agent (>24hrs) due to surgical blood loss or risk of bleeding: no

## 2014-01-12 NOTE — Op Note (Signed)
Holly Valenzuela, Holly Valenzuela NO.:  [id number]  MEDICAL RECORD NO.:  [id number]  LOCATION:  33                         FACILITY:  [facility]  PHYSICIAN:  Lind Guest. Ninfa Linden, M.D.DATE OF BIRTH:  01-24-1941  DATE OF PROCEDURE:  01/12/2014 DATE OF DISCHARGE:                              OPERATIVE REPORT   PREOPERATIVE DIAGNOSES:  Severe end-stage arthritis and degenerative joint disease, right hip.  PREOPERATIVE DIAGNOSES:  Severe end-stage arthritis and degenerative joint disease, right hip.  PROCEDURE:  Right total hip arthroplasty through direct anterior approach.  IMPLANTS:  DePuy Sector Gription acetabular component size 50 with apex hole eliminator guide and a single screw, size 32+ 4 neutral polyethylene liner, size 13 Corail femoral component with standard offset, size 32+ 1 ceramic hip ball.  SURGEON:  Lind Guest. Ninfa Linden, M.D.  ASSISTANT:  Erskine Emery, P.A.  ANESTHESIA:  Spinal.  ANTIBIOTICS:  2 g of IV Ancef.  BLOOD LOSS:  150-200 mL.  COMPLICATIONS:  None.  INDICATIONS:  Ms. Panella is a 73 year old female, very well known to me.  She has had debilitating right hip pain for many years now and has gotten to where it is affecting her activities of daily living including getting out of the car, getting out of the bed and getting around her house.  This is quite painful to her.  Her pain is daily, she is miserable and she says she has decreased mobility at this point.  Now, she wished to proceed with the total hip arthroplasty through direct anterior approach.  She understands the risks of acute blood loss anemia, nerve and vessel injury, fracture, infection, dislocation, DVT. She understands the goals are decreased pain, improved mobility, and overall improved quality of life.  PROCEDURE DESCRIPTION:  After informed consent was obtained, appropriate right hip was marked.  She was brought to the operating room and spinal anesthesia was  obtained while she was on her stretcher.  She was then laid in supine position on the stretcher.  Foley catheter was placed and then both feet had traction boots applied to them.  Next, she was placed supine on the Hana fracture table with the perineal post in place and both legs in inline skeletal traction devices, but no traction applied. Her right operative hip was prepped and draped with DuraPrep and sterile drapes.  A time-out was called, and she was identified as the correct patient and correct right hip.  I then made an incision inferior and posterior to the anterior superior iliac spine and carried this obliquely down the leg.  We dissected down to the tensor fascia lata muscle, and the tensor fascia was divided longitudinally, so we could proceed with a direct anterior approach of the hip.  We cauterized the lateral femoral circumflex vessels and then put Cobra retractors around the medial neck and up underneath the rectus femoris on the medial neck and around the lateral neck.  We then opened her up her hip capsule in an L-type format and found a large joint effusion.  We then made our femoral neck cut proximal to the lesser trochanter with an oscillating saw and completed this with an osteotome.  We placed a corkscrew guide in the femoral head  and removed the femoral head in its entirety and found completely collapsed and devoid of cartilage.  We found significant cartilage irregularities in the acetabulum as well.  We cleaned the acetabulum debris and then began reaming under 2-mm increments from a size 42 up to a size 50 with all reamers under direct visualization and the last two reamers under direct fluoroscopy, so, we could obtain our depth of reaming, our inclination and anteversion. Once I was pleased with this, I placed the real DePuy Sector Gription acetabular component size 50, an apex hole eliminator guide and a single screw.  I then placed real 32+ 4 neutral  polyethylene liner for a size 50 acetabular component.  Attention was then turned to the femur with the leg externally rotated to 100 degrees, extended and adducted.  We were able to place a Mueller retractor medially and a Hohmann retractor behind the greater trochanter.  We released the lateral joint capsule and then used a box cutting osteotome to enter the femoral canal and a rongeur to lateralize and began broaching from a size 8 broach using the Corail compaction broaching system up to a size 13.  With the size 13, we trialed a standard neck and a 36+ 1 hip ball.  We brought the leg back over and up with traction and internal rotation reducing the pelvis.  Her leg lengths were measured equal as well as her offsets under direct fluoroscopy.  Her internal and external rotation even past 100 degrees was stable.  We then dislocated the hip, removed the trial components.  We placed the real Corail femoral component size 13, the real 32+ 1 ceramic hip ball and reduced this back in the acetabulum and again it was stable.  We copiously irrigated the soft tissue and joint with normal saline solution using pulsatile lavage.  We then closed the joint capsule with interrupted #1 Ethibond suture followed by running #1 Vicryl in the tensor fascia, 0 Vicryl in the deep tissue, 2-0 Vicryl in the subcutaneous tissue, 4-0 Vicryl subcuticular stitch and Steri-Strips on the skin.  An Aquacel dressing was applied.  She was taken off the Hana table and taken to the recovery room in stable condition.  All final counts were correct.  There were no complications noted.  Of note, Erskine Emery, PA-C assisted during the entire case and his assistance was crucial for completing this case.     Lind Guest. Ninfa Linden, M.D.     CYB/MEDQ  D:  01/12/2014  T:  01/12/2014  Job:  [id number]

## 2014-01-13 LAB — CBC
HCT: 36.3 % (ref 36.0–46.0)
Hemoglobin: 12 g/dL (ref 12.0–15.0)
MCH: 32.9 pg (ref 26.0–34.0)
MCHC: 33.1 g/dL (ref 30.0–36.0)
MCV: 99.5 fL (ref 78.0–100.0)
Platelets: 190 10*3/uL (ref 150–400)
RBC: 3.65 MIL/uL — ABNORMAL LOW (ref 3.87–5.11)
RDW: 13.2 % (ref 11.5–15.5)
WBC: 12.7 10*3/uL — ABNORMAL HIGH (ref 4.0–10.5)

## 2014-01-13 LAB — BASIC METABOLIC PANEL
Anion gap: 9 (ref 5–15)
BUN: 9 mg/dL (ref 6–23)
CO2: 26 mEq/L (ref 19–32)
CREATININE: 0.74 mg/dL (ref 0.50–1.10)
Calcium: 8.6 mg/dL (ref 8.4–10.5)
Chloride: 100 mEq/L (ref 96–112)
GFR calc Af Amer: >90 mL/min (ref >90)
GFR, EST NON AFRICAN AMERICAN: 82 mL/min — AB (ref >90)
GLUCOSE: 135 mg/dL — AB (ref 70–99)
Potassium: 4.3 mEq/L (ref 3.7–5.3)
SODIUM: 135 meq/L — AB (ref 137–147)

## 2014-01-13 MED ORDER — HYDROMORPHONE HCL PF 2 MG/ML IJ SOLN
2.0000 mg | INTRAMUSCULAR | Status: DC | PRN
  Administered 2014-01-13: 1 mg via INTRAVENOUS
  Administered 2014-01-13: 2 mg via INTRAVENOUS
  Administered 2014-01-13: 1 mg via INTRAVENOUS
  Filled 2014-01-13 (×3): qty 1

## 2014-01-13 MED ORDER — PROMETHAZINE HCL 25 MG/ML IJ SOLN: 12.5000 mg | Freq: Four times a day (QID) | INTRAMUSCULAR | Status: DC | PRN

## 2014-01-13 MED ORDER — METOCLOPRAMIDE HCL 5 MG/ML IJ SOLN: 10.0000 mg | Freq: Three times a day (TID) | INTRAMUSCULAR | Status: DC | PRN

## 2014-01-13 NOTE — Progress Notes (Signed)
Subjective: 1 Day Post-Op Procedure(s) (LRB): RIGHT TOTAL HIP ARTHROPLASTY ANTERIOR APPROACH (Right) Patient reports pain as moderate.  Does report nausea.  Objective: Vital signs in last 24 hours: Temp:  [96.7 F (35.9 C)-99.1 F (37.3 C)] 98.7 F (37.1 C) (08/15 1108) Pulse Rate:  [54-74] 70 (08/15 1108) Resp:  [14-20] 15 (08/15 1108) BP: (101-123)/(60-85) 119/67 mmHg (08/15 1108) SpO2:  [94 %-100 %] 100 % (08/15 1108) Weight:  [43.092 kg (95 lb)] 43.092 kg (95 lb) (08/14 1300)  Intake/Output from previous day: 08/14 0701 - 08/15 0700 In: 5042.5 [P.O.:870; I.V.:4122.5; IV Piggyback:50] Out: 9179 [Urine:1400; Blood:150] Intake/Output this shift: Total I/O In: 120 [P.O.:120] Out: 600 [Urine:600]   Recent Labs  01/13/14 0520  HGB 12.0    Recent Labs  01/13/14 0520  WBC 12.7*  RBC 3.65*  HCT 36.3  PLT 190    Recent Labs  01/13/14 0520  NA 135*  K 4.3  CL 100  CO2 26  BUN 9  CREATININE 0.74  GLUCOSE 135*  CALCIUM 8.6   No results found for this basename: LABPT, INR,  in the last 72 hours  Sensation intact distally Intact pulses distally Dorsiflexion/Plantar flexion intact Incision: dressing C/D/I  Assessment/Plan: 1 Day Post-Op Procedure(s) (LRB): RIGHT TOTAL HIP ARTHROPLASTY ANTERIOR APPROACH (Right) Up with therapy Discharge to [facility] Monday  Shubham Thackston Y 01/13/2014, 11:24 AM

## 2014-01-13 NOTE — Progress Notes (Signed)
Patient was in a severe amount of pain last night.  At the beginning of the shift, patient was alternating oxy IR, morphine 1 mg every 1 hour, and Robaxin.  This was not touching her pain.  I paged MD on call and received an order for morphine 2 mg q2h prn.  This was also not effective.  Around 12, Patient was in so much pain she said she wanted to try Dilaudid, which was listed under her allergy list.  She reported that during a previous hospitalization she had severe N/V during the time she had received Dilaudid but was unsure if Dilaudid had caused these symptoms.  This RN received an order for Dilaudid 2 mg q2h prn.  Patient did much better with the 2 mg dose of dilaudid. She did develop nausea and dry heaving shortly after receiving dilaudid but this resolved after she was given zofran 4 mg IV.  About 3 hours after her first dose of dilaudid, pt rated her pain much less and wanted only 1 mg of the dilaudid. She is sleeping and much more comfortable at this time.  She was given Reglan 5 mg IV with this dose and has not had any more n/v.  Will continue to monitor.Azzie Glatter [place]

## 2014-01-13 NOTE — Progress Notes (Signed)
Physical Therapy Treatment Patient Details Name: Holly Valenzuela MRN: [id number] DOB: February 09, 1941 Today's Date: 01/13/2014    History of Present Illness R THR    PT Comments    Pt motivated and cooperative with with increased time required all tasks 2* ongoing nausea this am  Follow Up Recommendations  SNF     Equipment Recommendations  Rolling walker with 5" wheels    Recommendations for Other Services OT consult     Precautions / Restrictions Precautions Precautions: Fall Restrictions Weight Bearing Restrictions: No Other Position/Activity Restrictions: WBAT    Mobility  Bed Mobility Overal bed mobility: Needs Assistance Bed Mobility: Supine to Sit     Supine to sit: Min assist;Mod assist     General bed mobility comments: cues for sequence and use of L LE to self assist  Transfers Overall transfer level: Needs assistance Equipment used: Rolling walker (2 wheeled) Transfers: Sit to/from Stand Sit to Stand: Mod assist         General transfer comment: cues for LE management and use of UEs to self assist  Ambulation/Gait Ambulation/Gait assistance: Min assist;Mod assist Ambulation Distance (Feet): 12 Feet Assistive device: Rolling walker (2 wheeled) Gait Pattern/deviations: Step-to pattern;Decreased step length - right;Decreased step length - left;Shuffle;Trunk flexed Gait velocity: decr   General Gait Details: cues for posture, position from RW and initial sequence; Increased time in initial standing to stand erect, extend R knee and achieve R heel contact   Stairs            Wheelchair Mobility    Modified Rankin (Stroke Patients Only)       Balance                                    Cognition Arousal/Alertness: Awake/alert Behavior During Therapy: WFL for tasks assessed/performed Overall Cognitive Status: Within Functional Limits for tasks assessed                      Exercises Total Joint  Exercises Ankle Circles/Pumps: AROM;Both;15 reps;Supine Quad Sets: AROM;Both;10 reps;Supine Heel Slides: AAROM;Right;Supine;20 reps Hip ABduction/ADduction: AAROM;Right;Supine;15 reps    General Comments        Pertinent Vitals/Pain Pain Assessment: 0-10 Pain Score: 4  Pain Location: R hip Pain Descriptors / Indicators: Aching Pain Intervention(s): Limited activity within patient's tolerance;Monitored during session;Premedicated before session;Ice applied    Home Living Family/patient expects to be discharged to:: Skilled nursing facility Living Arrangements: Alone                  Prior Function Level of Independence: Independent with assistive device(s)      Comments: Pt struggling with meals and self care 2* hip pain    PT Goals (current goals can now be found in the care plan section) Acute Rehab PT Goals Patient Stated Goal: Walk and move without pain PT Goal Formulation: With patient Time For Goal Achievement: 01/19/14 Potential to Achieve Goals: Good Progress towards PT goals: Progressing toward goals    Frequency  7X/week    PT Plan Current plan remains appropriate    Co-evaluation             End of Session Equipment Utilized During Treatment: Gait belt Activity Tolerance: Patient tolerated treatment well Patient left: in chair;with call bell/phone within reach     Time: 1117-1207 PT Time Calculation (min): 50 min  Charges:  $Gait Training: 8-22 mins $  Therapeutic Exercise: 8-22 mins $Therapeutic Activity: 8-22 mins                    G Codes:      Holly Valenzuela January 29, 2014, 12:47 PM

## 2014-01-13 NOTE — Progress Notes (Signed)
Occupational Therapy Evaluation Patient Details Name: Holly Valenzuela MRN: [id number] DOB: 1941/01/31 Today's Date: 01/13/2014    History of Present Illness R THR   Clinical Impression   Patient presents to OT with decreased independence and safety with ADLs s/p R THR. All of pt's OT needs can be met in next venue of care (SNF). Will sign off.    Follow Up Recommendations  SNF    Equipment Recommendations  Tub/shower bench    Recommendations for Other Services       Precautions / Restrictions Precautions Precautions: Fall Restrictions Weight Bearing Restrictions: No Other Position/Activity Restrictions: WBAT      Mobility Bed Mobility                  Transfers                      Balance                                            ADL Overall ADL's : Needs assistance/impaired Eating/Feeding: Independent;Bed level   Grooming: Wash/dry hands;Wash/dry face;Oral care;Set up;Bed level   Upper Body Bathing: Minimal assitance;Bed level   Lower Body Bathing: Total assistance;Bed level                       Functional mobility during ADLs:  (deferred by pt -- wants to work with PT first) General ADL Comments: Patient requires A with most ADLs at this time. Educated pt on role of OT. Patient plans to d/c to SNF and informed pt that she will continue to work on ADLs at [facility].     Vision                     Perception     Praxis      Pertinent Vitals/Pain Pain Assessment: No/denies pain (states had "bad night" last night but recently had pain meds)     Hand Dominance Left   Extremity/Trunk Assessment Upper Extremity Assessment Upper Extremity Assessment: Overall WFL for tasks assessed   Lower Extremity Assessment Lower Extremity Assessment: Defer to PT evaluation       Communication Communication Communication: No difficulties   Cognition Arousal/Alertness: Awake/alert Behavior During  Therapy: WFL for tasks assessed/performed Overall Cognitive Status: Within Functional Limits for tasks assessed                     General Comments       Exercises       Shoulder Instructions      Home Living Family/patient expects to be discharged to:: Skilled nursing facility Living Arrangements: Alone                                      Prior Functioning/Environment Level of Independence: Independent with assistive device(s)        Comments: Pt struggling with meals and self care 2* hip pain     OT Diagnosis: Acute pain   OT Problem List: Decreased strength;Decreased activity tolerance;Decreased safety awareness;Decreased knowledge of use of DME or AE   OT Treatment/Interventions:      OT Goals(Current goals can be found in the care plan section)    OT Frequency:  Barriers to D/C:            Co-evaluation              End of Session Equipment Utilized During Treatment: Oxygen  Activity Tolerance: Patient tolerated treatment well Patient left: in bed;with call bell/phone within reach;with family/visitor present   Time: 8592-9244 OT Time Calculation (min): 14 min Charges:  OT General Charges $OT Visit: 1 Procedure OT Evaluation $Initial OT Evaluation Tier I: 1 Procedure OT Treatments $Self Care/Home Management : 8-22 mins G-Codes:    Holly Valenzuela A 02-08-2014, 10:32 AM

## 2014-01-13 NOTE — Progress Notes (Signed)
Physical Therapy Treatment Patient Details Name: Holly Valenzuela MRN: [id number] DOB: Oct 30, 1940 Today's Date: 2014/01/21    History of Present Illness R THR    PT Comments    Pt pleasant and cooperative but ltd this pm by fatigue/nausea.  Follow Up Recommendations  SNF     Equipment Recommendations  Rolling walker with 5" wheels    Recommendations for Other Services OT consult     Precautions / Restrictions Precautions Precautions: Fall Restrictions Weight Bearing Restrictions: No Other Position/Activity Restrictions: WBAT    Mobility  Bed Mobility Overal bed mobility: Needs Assistance Bed Mobility: Sit to Supine       Sit to supine: Min assist;Mod assist   General bed mobility comments: cues for sequence and use of L LE to self assist  Transfers Overall transfer level: Needs assistance Equipment used: Rolling walker (2 wheeled) Transfers: Sit to/from Stand Sit to Stand: Min assist;Mod assist         General transfer comment: cues for LE management and use of UEs to self assist  Ambulation/Gait Ambulation/Gait assistance: Min assist;Mod assist Ambulation Distance (Feet): 6 Feet Assistive device: Rolling walker (2 wheeled) Gait Pattern/deviations: Step-to pattern;Decreased step length - right;Decreased step length - left;Shuffle;Trunk flexed Gait velocity: decr   General Gait Details: cues for posture, position from RW and initial sequence; Increased time in initial standing to stand erect, extend R knee and achieve R heel contact   Stairs            Wheelchair Mobility    Modified Rankin (Stroke Patients Only)       Balance                                    Cognition Arousal/Alertness: Awake/alert Behavior During Therapy: WFL for tasks assessed/performed Overall Cognitive Status: Within Functional Limits for tasks assessed                      Exercises      General Comments        Pertinent  Vitals/Pain Pain Assessment: 0-10 Pain Score: 5  Pain Location: R hip Pain Descriptors / Indicators: Aching Pain Intervention(s): Limited activity within patient's tolerance;Monitored during session;Ice applied;Premedicated before session    Home Living                      Prior Function            PT Goals (current goals can now be found in the care plan section) Acute Rehab PT Goals Patient Stated Goal: Walk and move without pain PT Goal Formulation: With patient Time For Goal Achievement: 01/19/14 Potential to Achieve Goals: Good Progress towards PT goals: Progressing toward goals    Frequency  7X/week    PT Plan Current plan remains appropriate    Co-evaluation             End of Session Equipment Utilized During Treatment: Gait belt Activity Tolerance: Patient limited by pain;Other (comment) (nausea) Patient left: in bed;with call bell/phone within reach     Time: 7026-3785 PT Time Calculation (min): 18 min  Charges:  $Gait Training: 8-22 mins                    G Codes:      Mitra Duling January 21, 2014, 3:26 PM

## 2014-01-13 NOTE — Progress Notes (Signed)
Clinical Social Work Department BRIEF PSYCHOSOCIAL ASSESSMENT 01/13/2014  Patient:  Holly Valenzuela, Holly Valenzuela     Account Number:  [id number]     Admit date:  01/12/2014  Clinical Social Worker:  Renold Genta  Date/Time:  01/13/2014 11:19 AM  Referred by:  Physician  Date Referred:  01/13/2014 Referred for  SNF Placement   Other Referral:   Interview type:  Patient Other interview type:   and daughter, [name]    PSYCHOSOCIAL DATA Living Status:  ALONE Admitted from facility:   Level of care:   Primary support name:  Margarita Rana (dtr) c#: [phone] Primary support relationship to patient:  CHILD, ADULT Degree of support available:   good    CURRENT CONCERNS Current Concerns  Post-Acute Placement   Other Concerns:    SOCIAL WORK ASSESSMENT / PLAN CSW received consult that patient will need SNF at discharge.   Assessment/plan status:  Information/Referral to [employer] Other assessment/ plan:   Information/referral to community resources:   CSW completed FL2 and faxed information to [facility], per patient's request.    PATIENT'S/FAMILY'S RESPONSE TO PLAN OF CARE: CSW received call from [facility] that patient's daughter, [name] has toured and would like for her mother go to there at discharge. CSW confirmed with Ivin Booty that they would be able to take her when she's ready, even if it's Sunday.       Raynaldo Opitz, [facility] Clinical Social Worker cell #: [phone]

## 2014-01-13 NOTE — Progress Notes (Addendum)
Clinical Social Work Department CLINICAL SOCIAL WORK PLACEMENT NOTE 01/13/2014  Patient:  Holly Valenzuela, Holly Valenzuela  Account Number:  [id number] Admit date:  01/12/2014  Clinical Social Worker:  Renold Genta  Date/time:  01/13/2014 11:28 AM  Clinical Social Work is seeking post-discharge placement for this patient at the following level of care:   SKILLED NURSING   (*CSW will update this form in Epic as items are completed)   01/13/2014  Patient/family provided with [place] Department of Clinical Social Work's list of facilities offering this level of care within the geographic area requested by the patient (or if unable, by the patient's family).  01/13/2014  Patient/family informed of their freedom to choose among providers that offer the needed level of care, that participate in Medicare, Medicaid or managed care program needed by the patient, have an available bed and are willing to accept the patient.  01/13/2014  Patient/family informed of [facility]' ownership interest in [facility], as well as of the fact that they are under no obligation to receive care at this facility.  PASARR submitted to EDS on 01/13/2014 PASARR number received on 01/13/2014  FL2 transmitted to all facilities in geographic area requested by pt/family on  01/13/2014 FL2 transmitted to all facilities within larger geographic area on   Patient informed that his/her managed care company has contracts with or will negotiate with  certain facilities, including the following:     Patient/family informed of bed offers received:  01/13/2014 Patient chooses bed at [place] Physician recommends and patient chooses bed at    Patient to be transferred to [place] on  01/15/14 Patient to be transferred to facility by [facility] Patient and family notified of transfer on 01/15/14 Name of family member notified:  Michelle-dtr via phone  The following physician request were entered in  Epic:   Additional Comments:   Raynaldo Opitz, [place] Social Worker cell #: [phone]

## 2014-01-14 LAB — CBC
HCT: 37 % (ref 36.0–46.0)
Hemoglobin: 12.2 g/dL (ref 12.0–15.0)
MCH: 32.5 pg (ref 26.0–34.0)
MCHC: 33 g/dL (ref 30.0–36.0)
MCV: 98.7 fL (ref 78.0–100.0)
PLATELETS: 183 10*3/uL (ref 150–400)
RBC: 3.75 MIL/uL — AB (ref 3.87–5.11)
RDW: 13.3 % (ref 11.5–15.5)
WBC: 12.9 10*3/uL — AB (ref 4.0–10.5)

## 2014-01-14 MED ORDER — ASPIRIN 325 MG PO TBEC: 325.0000 mg | DELAYED_RELEASE_TABLET | Freq: Two times a day (BID) | ORAL | Status: DC

## 2014-01-14 MED ORDER — OXYCODONE-ACETAMINOPHEN 5-325 MG PO TABS: 1.0000 | ORAL_TABLET | ORAL | Status: DC | PRN

## 2014-01-14 MED ORDER — POLYETHYLENE GLYCOL 3350 17 G PO PACK
17.0000 g | PACK | Freq: Every day | ORAL | Status: DC
  Administered 2014-01-14 – 2014-01-15 (×2): 17 g via ORAL

## 2014-01-14 MED ORDER — MAGNESIUM HYDROXIDE 400 MG/5ML PO SUSP: 15.0000 mL | Freq: Every day | ORAL | Status: DC | PRN

## 2014-01-14 MED ORDER — BUPIVACAINE HCL (PF) 0.5 % IJ SOLN
INTRAMUSCULAR | Status: DC | PRN
  Administered 2014-01-12: 2.5 mL

## 2014-01-14 NOTE — Plan of Care (Signed)
Problem: Phase III Progression Outcomes Goal: Anticoagulant follow-up in place Outcome: Not Applicable Date Met:  84/72/07 asa

## 2014-01-14 NOTE — Addendum Note (Signed)
Addendum created 01/14/14 1725 by Montel Clock, CRNA   Modules edited: Anesthesia Blocks and Procedures, Anesthesia Medication Administration, Clinical Notes   Clinical Notes:  File: [id number]

## 2014-01-14 NOTE — Progress Notes (Signed)
Physical Therapy Treatment Patient Details Name: Holly Valenzuela MRN: [id number] DOB: 07-24-1940 Today's Date: 01-23-14    History of Present Illness R THR    PT Comments    Marked improvement in activity tolerance vs yesterday.  Pt very happy with progress.  Follow Up Recommendations  SNF     Equipment Recommendations  Rolling walker with 5" wheels    Recommendations for Other Services OT consult     Precautions / Restrictions Precautions Precautions: Fall Restrictions Weight Bearing Restrictions: No Other Position/Activity Restrictions: WBAT    Mobility  Bed Mobility Overal bed mobility: Needs Assistance Bed Mobility: Supine to Sit;Sit to Supine     Supine to sit: Min assist Sit to supine: Min assist   General bed mobility comments: cues for sequence and use of L LE to self assist  Transfers Overall transfer level: Needs assistance Equipment used: Rolling walker (2 wheeled) Transfers: Sit to/from Stand Sit to Stand: Min assist         General transfer comment: cues for LE management and use of UEs to self assist  Ambulation/Gait Ambulation/Gait assistance: Min assist Ambulation Distance (Feet): 111 Feet Assistive device: Rolling walker (2 wheeled) Gait Pattern/deviations: Step-to pattern;Decreased step length - right;Decreased step length - left;Shuffle;Trunk flexed Gait velocity: decr   General Gait Details: cues for posture, position from RW and initial sequence; Increased time in initial standing to stand erect, extend R knee and achieve R heel contact   Stairs            Wheelchair Mobility    Modified Rankin (Stroke Patients Only)       Balance                                    Cognition Arousal/Alertness: Awake/alert Behavior During Therapy: WFL for tasks assessed/performed Overall Cognitive Status: Within Functional Limits for tasks assessed                      Exercises      General  Comments        Pertinent Vitals/Pain Pain Assessment: 0-10 Pain Score: 3  Pain Location: R hip Pain Descriptors / Indicators: Burning;Sore Pain Intervention(s): Limited activity within patient's tolerance;Monitored during session;Patient requesting pain meds-RN notified;Ice applied    Home Living                      Prior Function            PT Goals (current goals can now be found in the care plan section) Acute Rehab PT Goals Patient Stated Goal: Walk and move without pain PT Goal Formulation: With patient Time For Goal Achievement: 01/19/14 Potential to Achieve Goals: Good Progress towards PT goals: Progressing toward goals    Frequency  7X/week    PT Plan Current plan remains appropriate    Co-evaluation             End of Session Equipment Utilized During Treatment: Gait belt Activity Tolerance: Patient tolerated treatment well Patient left: in bed;with call bell/phone within reach     Time: 1445-1502 PT Time Calculation (min): 17 min  Charges:  $Gait Training: 8-22 mins                    G Codes:      Holly Valenzuela January 23, 2014, 3:56 PM

## 2014-01-14 NOTE — Progress Notes (Signed)
Patient ID: Holly Valenzuela, female   DOB: 1941/02/22, 73 y.o.   MRN: [id number] Patient complains of reflux. Will order medication for this. Plan for discharge to [facility] skilled nursing. Patient is comfortable this morning. Status post total hip arthroplasty.

## 2014-01-14 NOTE — Progress Notes (Signed)
CARE MANAGEMENT NOTE 01/14/2014  Patient:  Holly Valenzuela, Holly Valenzuela   Account Number:  [id number]  Date Initiated:  01/12/2014  Documentation initiated by:  DAVIS,RHONDA  Subjective/Objective Assessment:   rt total hip placement     Action/Plan:   home versus snf placement   Anticipated DC Date:  01/15/2014   Anticipated DC Plan:  SKILLED NURSING FACILITY  In-house referral  Clinical Social Worker      DC Planning Services  CM consult      Choice offered to / List presented to:             Status of service:  Completed, signed off Medicare Important Message given?  YES (If response is "NO", the following Medicare IM given date fields will be blank) Date Medicare IM given:  01/14/2014 Medicare IM given by:  [facility] Date Additional Medicare IM given:   Additional Medicare IM given by:    Discharge Disposition:  [place]  Per UR Regulation:  Reviewed for med. necessity/level of care/duration of stay  If discussed at [place] of Stay Meetings, dates discussed:    Comments:  Suanne Marker Davis,RN,BSN,CCM

## 2014-01-14 NOTE — Anesthesia Procedure Notes (Signed)
Spinal  Start time: 01/12/2014 9:22 AM End time: 01/12/2014 9:29 AM Staffing Anesthesiologist: Peyton Najjar CRNA/Resident: Darlys Gales R Performed by: anesthesiologist and resident/CRNA  Preanesthetic Checklist Completed: patient identified, site marked, surgical consent, pre-op evaluation, timeout performed, IV checked, risks and benefits discussed and monitors and equipment checked Spinal Block Patient position: sitting Prep: Betadine Patient monitoring: heart rate, cardiac monitor, continuous pulse ox and blood pressure Approach: midline Location: L2-3 Injection technique: single-shot Needle Needle type: Spinocan  Needle gauge: 22 G Needle length: 9 cm Needle insertion depth: 6 cm Assessment Sensory level: T4

## 2014-01-14 NOTE — Progress Notes (Signed)
Physical Therapy Treatment Patient Details Name: Holly Valenzuela MRN: [id number] DOB: 04/16/1941 Today's Date: 01/20/2014    History of Present Illness R THR    PT Comments      Follow Up Recommendations  SNF     Equipment Recommendations  Rolling walker with 5" wheels    Recommendations for Other Services OT consult     Precautions / Restrictions Precautions Precautions: Fall Restrictions Weight Bearing Restrictions: No Other Position/Activity Restrictions: WBAT    Mobility  Bed Mobility                  Transfers                    Ambulation/Gait                 Stairs            Wheelchair Mobility    Modified Rankin (Stroke Patients Only)       Balance                                    Cognition Arousal/Alertness: Awake/alert Behavior During Therapy: WFL for tasks assessed/performed Overall Cognitive Status: Within Functional Limits for tasks assessed                      Exercises Total Joint Exercises Ankle Circles/Pumps: AROM;Both;15 reps;Supine Quad Sets: AROM;Both;Supine;15 reps Gluteal Sets: AROM;Both;10 reps;Supine Heel Slides: AAROM;Right;Supine;20 reps Hip ABduction/ADduction: AAROM;Right;Supine;20 reps    General Comments        Pertinent Vitals/Pain Pain Assessment: 0-10 Pain Score: 4  Pain Location: R hip Pain Descriptors / Indicators: Sore Pain Intervention(s): Limited activity within patient's tolerance;Monitored during session;Premedicated before session;Ice applied    Home Living                      Prior Function            PT Goals (current goals can now be found in the care plan section) Acute Rehab PT Goals Patient Stated Goal: Walk and move without pain PT Goal Formulation: With patient Time For Goal Achievement: 01/19/14 Potential to Achieve Goals: Good Progress towards PT goals: Progressing toward goals    Frequency  7X/week     PT Plan Current plan remains appropriate    Co-evaluation             End of Session   Activity Tolerance: Patient tolerated treatment well Patient left: in bed;with call bell/phone within reach     Time: 1152-1211 PT Time Calculation (min): 19 min  Charges:  $Therapeutic Exercise: 8-22 mins                    G Codes:      Holly Valenzuela 2014/01/20, 12:44 PM

## 2014-01-14 NOTE — Discharge Instructions (Signed)
Pickup stool softener for constipation. Weight Bearing as tolerated  No hip precautions Progress activities slowly Expect hip and possible knee soreness and swelling. Apply heat or ice as needed. Keep dressing clean dry and intact, may shower with dressing intact. On Friday remove dressing, incision can get wet. After showering apply clean dressing.

## 2014-01-15 LAB — CBC
HEMATOCRIT: 35.8 % — AB (ref 36.0–46.0)
HEMOGLOBIN: 12.1 g/dL (ref 12.0–15.0)
MCH: 32.8 pg (ref 26.0–34.0)
MCHC: 33.8 g/dL (ref 30.0–36.0)
MCV: 97 fL (ref 78.0–100.0)
Platelets: 189 10*3/uL (ref 150–400)
RBC: 3.69 MIL/uL — AB (ref 3.87–5.11)
RDW: 13.1 % (ref 11.5–15.5)
WBC: 10.6 10*3/uL — AB (ref 4.0–10.5)

## 2014-01-15 MED ORDER — LIP MEDEX EX OINT
TOPICAL_OINTMENT | CUTANEOUS | Status: AC
  Administered 2014-01-15: 06:00:00
  Filled 2014-01-15: qty 7

## 2014-01-15 NOTE — Progress Notes (Signed)
Clinical Social Work  CSW faxed DC summary to [facility] who is agreeable to accept patient today. CSW prepared DC packet with DC summary, FL2, and hard scripts included. CSW informed patient and RN of DC plans. Patient agreeable for CSW to contact dtr [name]) re: DC plans. CSW called dtr and left a message. CSW arranged transportation via [facility] per patient request. [facility] #: [id number].  CSW is signing off but available if needed.  Holly Valenzuela, Holly Valenzuela [phone]

## 2014-01-15 NOTE — Progress Notes (Signed)
Subjective: 3 Days Post-Op Procedure(s) (LRB): RIGHT TOTAL HIP ARTHROPLASTY ANTERIOR APPROACH (Right) Patient reports pain as mild.    Objective: Vital signs in last 24 hours: Temp:  [98.7 F (37.1 C)-99.4 F (37.4 C)] 98.8 F (37.1 C) (08/17 0510) Pulse Rate:  [73-82] 73 (08/17 0510) Resp:  [16-18] 16 (08/17 0510) BP: (106-122)/(74-90) 106/80 mmHg (08/17 0510) SpO2:  [94 %-95 %] 95 % (08/17 0510)  Intake/Output from previous day: 08/16 0701 - 08/17 0700 In: 960 [P.O.:960] Out: 1225 [Urine:1225] Intake/Output this shift:     Recent Labs  01/13/14 0520 01/14/14 0532 01/15/14 0435  HGB 12.0 12.2 12.1    Recent Labs  01/14/14 0532 01/15/14 0435  WBC 12.9* 10.6*  RBC 3.75* 3.69*  HCT 37.0 35.8*  PLT 183 189    Recent Labs  01/13/14 0520  NA 135*  K 4.3  CL 100  CO2 26  BUN 9  CREATININE 0.74  GLUCOSE 135*  CALCIUM 8.6   No results found for this basename: LABPT, INR,  in the last 72 hours  Sensation intact distally Intact pulses distally Dorsiflexion/Plantar flexion intact Incision: dressing C/D/I  Assessment/Plan: 3 Days Post-Op Procedure(s) (LRB): RIGHT TOTAL HIP ARTHROPLASTY ANTERIOR APPROACH (Right) Discharge to SNF today.  Jasin Brazel Y 01/15/2014, 7:29 AM

## 2014-01-15 NOTE — Progress Notes (Signed)
Physical Therapy Treatment Patient Details Name: Holly Valenzuela MRN: [id number] DOB: [birth year] Today's Date: 01/15/2014    History of Present Illness R THR    PT Comments    Progressing well and eager for d/c to [facility] place  Follow Up Recommendations  SNF     Equipment Recommendations  Rolling walker with 5" wheels    Recommendations for Other Services OT consult     Precautions / Restrictions Precautions Precautions: Fall Restrictions Weight Bearing Restrictions: No Other Position/Activity Restrictions: WBAT    Mobility  Bed Mobility Overal bed mobility: Needs Assistance Bed Mobility: Supine to Sit;Sit to Supine     Supine to sit: Min assist Sit to supine: Min assist   General bed mobility comments: cues for sequence and use of L LE to self assist  Transfers Overall transfer level: Needs assistance Equipment used: Rolling walker (2 wheeled) Transfers: Sit to/from Stand Sit to Stand: Min assist         General transfer comment: cues for LE management and use of UEs to self assist  Ambulation/Gait Ambulation/Gait assistance: Min assist;Min guard Ambulation Distance (Feet): 200 Feet Assistive device: Rolling walker (2 wheeled) Gait Pattern/deviations: Step-to pattern;Step-through pattern;Decreased step length - right;Decreased step length - left;Shuffle;Trunk flexed     General Gait Details: cues for posture, position from RW and initial sequence; Increased time in initial standing to stand erect, extend R knee and achieve R heel contact   Stairs            Wheelchair Mobility    Modified Rankin (Stroke Patients Only)       Balance                                    Cognition Arousal/Alertness: Awake/alert Behavior During Therapy: WFL for tasks assessed/performed Overall Cognitive Status: Within Functional Limits for tasks assessed                      Exercises Total Joint Exercises Ankle  Circles/Pumps: AROM;Both;15 reps;Supine Quad Sets: AROM;Both;Supine;15 reps Gluteal Sets: AROM;Both;10 reps;Supine Heel Slides: AAROM;Right;Supine;20 reps Hip ABduction/ADduction: AAROM;Right;Supine;20 reps Long Arc Quad: AAROM;AROM;Right;15 reps;Seated    General Comments        Pertinent Vitals/Pain Pain Assessment: 0-10 Pain Score: 3  Pain Location: R hip Pain Descriptors / Indicators: Aching Pain Intervention(s): Limited activity within patient's tolerance;Monitored during session;Premedicated before session;Ice applied    Home Living                      Prior Function            PT Goals (current goals can now be found in the care plan section) Acute Rehab PT Goals Patient Stated Goal: Walk and move without pain PT Goal Formulation: With patient Time For Goal Achievement: 01/19/14 Potential to Achieve Goals: Good Progress towards PT goals: Progressing toward goals    Frequency  7X/week    PT Plan Current plan remains appropriate    Co-evaluation             End of Session   Activity Tolerance: Patient tolerated treatment well Patient left: in bed;with call bell/phone within reach     Time: 1108-1140 PT Time Calculation (min): 32 min  Charges:  $Gait Training: 8-22 mins $Therapeutic Exercise: 8-22 mins  G Codes:      Dezaree Tracey 01/25/2014, 11:58 AM

## 2014-01-15 NOTE — Discharge Summary (Signed)
Patient ID: Holly Valenzuela MRN: [id number] DOB/AGE: Nov 01, 1940 73 y.o.  Admit date: 01/12/2014 Discharge date: 01/15/2014  Admission Diagnoses:  Principal Problem:   Arthritis of right hip Active Problems:   Status post total replacement of right hip   Discharge Diagnoses:  Same  Past Medical History  Diagnosis Date  . Complication of anesthesia     SEVERE nausea vomiting  . Hyperlipidemia   . Headache(784.0)     HX MIGRAINES  . Arthritis   . Cancer     HX ENDOMETRIAL CANCER   . Depression     Surgeries: Procedure(s): RIGHT TOTAL HIP ARTHROPLASTY ANTERIOR APPROACH on 01/12/2014   Consultants:    Discharged Condition: Improved  Hospital Course: Jasalyn Frysinger is an 73 y.o. female who was admitted 01/12/2014 for operative treatment ofArthritis of right hip. Patient has severe unremitting pain that affects sleep, daily activities, and work/hobbies. After pre-op clearance the patient was taken to the operating room on 01/12/2014 and underwent  Procedure(s): RIGHT TOTAL HIP ARTHROPLASTY ANTERIOR APPROACH.    Patient was given perioperative antibiotics: Anti-infectives   Start     Dose/Rate Route Frequency Ordered Stop   01/12/14 1530  ceFAZolin (ANCEF) IVPB 1 g/50 mL premix     1 g 100 mL/hr over 30 Minutes Intravenous Every 6 hours 01/12/14 1256 01/12/14 2130   01/12/14 0813  ceFAZolin (ANCEF) IVPB 2 g/50 mL premix     2 g 100 mL/hr over 30 Minutes Intravenous On call to O.R. 01/12/14 0813 01/12/14 0932       Patient was given sequential compression devices, early ambulation, and chemoprophylaxis to prevent DVT.  Patient benefited maximally from hospital stay and there were no complications.    Recent vital signs: Patient Vitals for the past 24 hrs:  BP Temp Temp src Pulse Resp SpO2  01/15/14 0510 106/80 mmHg 98.8 F (37.1 C) Oral 73 16 95 %  01/14/14 2145 122/90 mmHg 98.7 F (37.1 C) Oral 78 16 94 %  01/14/14 1346 120/74 mmHg 99.4 F (37.4 C)  Oral 82 18 95 %     Recent laboratory studies:  Recent Labs  01/13/14 0520 01/14/14 0532 01/15/14 0435  WBC 12.7* 12.9* 10.6*  HGB 12.0 12.2 12.1  HCT 36.3 37.0 35.8*  PLT 190 183 189  NA 135*  --   --   K 4.3  --   --   CL 100  --   --   CO2 26  --   --   BUN 9  --   --   CREATININE 0.74  --   --   GLUCOSE 135*  --   --   CALCIUM 8.6  --   --      Discharge Medications:     Medication List    STOP taking these medications       butalbital-acetaminophen-caffeine 50-325-40 MG per tablet  Commonly known as:  FIORICET, ESGIC      TAKE these medications       ALPRAZolam 0.5 MG tablet  Commonly known as:  XANAX  Take 0.25-0.5 mg by mouth 3 (three) times daily as needed for anxiety.     aspirin 325 MG EC tablet  Take 1 tablet (325 mg total) by mouth 2 (two) times daily after a meal.     atorvastatin 10 MG tablet  Commonly known as:  LIPITOR  Take 10 mg by mouth daily.     cholecalciferol 1000 UNITS tablet  Commonly known as:  VITAMIN  D  Take 1,000 Units by mouth daily.     cyanocobalamin 1000 MCG/ML injection  Commonly known as:  (VITAMIN B-12)  Inject 1,000 mcg into the muscle every 30 (thirty) days.     fluticasone 50 MCG/ACT nasal spray  Commonly known as:  FLONASE  Place 1 spray into both nostrils daily as needed for allergies or rhinitis.     multivitamin with minerals Tabs tablet  Take 1 tablet by mouth every evening.     oxyCODONE-acetaminophen 5-325 MG per tablet  Commonly known as:  ROXICET  Take 1-2 tablets by mouth every 4 (four) hours as needed for severe pain.     pantoprazole 40 MG tablet  Commonly known as:  PROTONIX  Take 40 mg by mouth daily.     pseudoephedrine 30 MG tablet  Commonly known as:  SUDAFED  Take 30 mg by mouth every 4 (four) hours as needed for congestion.        Diagnostic Studies: Dg Hip Complete Right  01/12/2014   CLINICAL DATA:  Right hip replacement surgery.  EXAM: DG C-ARM 1-60 MIN - NRPT [facility]; RIGHT HIP -  COMPLETE 2+ VIEW  COMPARISON:  None.  FINDINGS: The femoral and acetabular components are well seated. No complicating features are identified.  IMPRESSION: Well seated components of a total right hip arthroplasty.   Electronically Signed   By: Kalman Jewels M.D.   On: 01/12/2014 10:55   Dg Pelvis Portable  01/12/2014   CLINICAL DATA:  Postop  EXAM: PORTABLE PELVIS 1-2 VIEWS  COMPARISON:  Right hip same day  FINDINGS: Single frontal view of the pelvis submitted. There is right hip prosthesis anatomic alignment. Postsurgical changes with periarticular soft tissue air.  IMPRESSION: Right hip prosthesis in anatomic alignment.   Electronically Signed   By: Lahoma Crocker M.D.   On: 01/12/2014 12:15   Dg Hip Portable 1 View Right  01/12/2014   CLINICAL DATA:  Postop right hip  EXAM: PORTABLE RIGHT HIP - 1 VIEW  COMPARISON:  01/12/2014  FINDINGS: Single portable view of the right hip submitted. There is right hip prosthesis in anatomic alignment.  IMPRESSION: Right hip prosthesis in anatomic alignment.   Electronically Signed   By: Lahoma Crocker M.D.   On: 01/12/2014 12:14   Dg C-arm 1-60 Min-no Report  01/12/2014   CLINICAL DATA:  Right hip replacement surgery.  EXAM: DG C-ARM 1-60 MIN - NRPT [facility]; RIGHT HIP - COMPLETE 2+ VIEW  COMPARISON:  None.  FINDINGS: The femoral and acetabular components are well seated. No complicating features are identified.  IMPRESSION: Well seated components of a total right hip arthroplasty.   Electronically Signed   By: Kalman Jewels M.D.   On: 01/12/2014 10:55    Disposition: to skilled nursing facility      Discharge Instructions   Call MD / Call 911    Complete by:  As directed   If you experience chest pain or shortness of breath, CALL 911 and be transported to the hospital emergency room.  If you develope a fever above 101 F, pus (white drainage) or increased drainage or redness at the wound, or calf pain, call your surgeon's office.     Constipation Prevention     Complete by:  As directed   Drink plenty of fluids.  Prune juice may be helpful.  You may use a stool softener, such as Colace (over the counter) 100 mg twice a day.  Use MiraLax (over the counter) for constipation  as needed.     Diet - low sodium heart healthy    Complete by:  As directed      Discharge instructions    Complete by:  As directed   Increase activities as comfort allows. Full weight bearing as tolerated right hip; no hip precautions. Can get current dressing wet daily in the shower. Leave current dressing on until follow-up with Ortho as an outpatient     Discharge patient    Complete by:  As directed      Increase activity slowly as tolerated    Complete by:  As directed      Weight bearing as tolerated    Complete by:  As directed            Follow-up Information   Follow up with Mcarthur Rossetti, MD In 2 weeks.   Specialty:  Orthopedic Surgery   Contact information:   [place] [address] [phone]        Signed: Mcarthur Rossetti 01/15/2014, 7:31 AM

## 2014-01-16 ENCOUNTER — Non-Acute Institutional Stay (SKILLED_NURSING_FACILITY): Payer: Medicare Other | Admitting: Internal Medicine

## 2014-01-16 DIAGNOSIS — E7800 Pure hypercholesterolemia, unspecified: Secondary | ICD-10-CM

## 2014-01-16 DIAGNOSIS — E538 Deficiency of other specified B group vitamins: Secondary | ICD-10-CM | POA: Insufficient documentation

## 2014-01-16 DIAGNOSIS — K219 Gastro-esophageal reflux disease without esophagitis: Secondary | ICD-10-CM

## 2014-01-16 DIAGNOSIS — M1610 Unilateral primary osteoarthritis, unspecified hip: Secondary | ICD-10-CM

## 2014-01-16 DIAGNOSIS — M1611 Unilateral primary osteoarthritis, right hip: Secondary | ICD-10-CM

## 2014-01-16 NOTE — Progress Notes (Signed)
HISTORY & PHYSICAL  DATE: 01/16/2014   FACILITY: [facility]  LEVEL OF CARE: SNF (31)  ALLERGIES:  Allergies  Allergen Reactions  . Dilaudid [Hydromorphone Hcl] Nausea And Vomiting  . Adhesive [Tape] Itching  . Codeine Nausea And Vomiting    CHIEF COMPLAINT:  Manage right hip osteoarthritis, GERD and hyperlipidemia  HISTORY OF PRESENT ILLNESS: Patient is a 73 year old Caucasian female.  HIP OSTEOARTHRITIS: patient had advanced end stage OA of the hip with progressively worsening pain & dysfunction.  Pt failed non-surgical conservative management.  Therefore pt underwent total hip arthroplasty & tolerated the procedure well.  Pt denies hip pain currently.  Pt was admitted to this facility for short term rehabilitation.  GERD: pt's GERD is stable.  Denies ongoing heartburn, abd. Pain, nausea or vomiting.  Currently on a PPI & tolerates it without any adverse reactions.  HYPERLIPIDEMIA: No complications from the medications presently being used. Last fasting lipid panel not available.  PAST MEDICAL HISTORY :  Past Medical History  Diagnosis Date  . Complication of anesthesia     SEVERE nausea vomiting  . Hyperlipidemia   . Headache(784.0)     HX MIGRAINES  . Arthritis   . Cancer     HX ENDOMETRIAL CANCER   . Depression     PAST SURGICAL HISTORY: Past Surgical History  Procedure Laterality Date  . Cesarean section    . Appendectomy    . Thyroid surgery    . Abdominal hysterectomy    . Total hip arthroplasty Right 01/12/2014    Procedure: RIGHT TOTAL HIP ARTHROPLASTY ANTERIOR APPROACH;  Surgeon: Mcarthur Rossetti, MD;  Location: [facility];  Service: Orthopedics;  Laterality: Right;    SOCIAL HISTORY:  reports that she has been smoking Cigarettes.  She has been smoking about 0.00 packs per day. She has never used smokeless tobacco. She reports that she does not drink alcohol or use illicit drugs.  FAMILY HISTORY: None  CURRENT  MEDICATIONS: Reviewed per MAR/see medication list  REVIEW OF SYSTEMS:  See HPI otherwise 14 point ROS is negative.  PHYSICAL EXAMINATION  VS:  See VS section  GENERAL: no acute distress, thin body habitus EYES: conjunctivae normal, sclerae normal, normal eye lids MOUTH/THROAT: lips without lesions,no lesions in the mouth,tongue is without lesions,uvula elevates in midline NECK: supple, trachea midline, no neck masses, no thyroid tenderness, no thyromegaly LYMPHATICS: no LAN in the neck, no supraclavicular LAN RESPIRATORY: breathing is even & unlabored, BS CTAB CARDIAC: RRR, no murmur,no extra heart sounds, no edema GI:  ABDOMEN: abdomen soft, normal BS, no masses, no tenderness  LIVER/SPLEEN: no hepatomegaly, no splenomegaly MUSCULOSKELETAL: HEAD: normal to inspection  EXTREMITIES: LEFT UPPER EXTREMITY: full range of motion, normal strength & tone RIGHT UPPER EXTREMITY:  full range of motion, normal strength & tone LEFT LOWER EXTREMITY:  full range of motion, normal strength & tone RIGHT LOWER EXTREMITY: range of motion not tested due to surgery, normal strength & tone PSYCHIATRIC: the patient is alert & oriented to person, affect & behavior appropriate  LABS/RADIOLOGY:  Labs reviewed: Basic Metabolic Panel:  Recent Labs  01/01/14 1450 01/13/14 0520  NA 139 135*  K 4.5 4.3  CL 100 100  CO2 28 26  GLUCOSE 83 135*  BUN 13 9  CREATININE 0.86 0.74  CALCIUM 10.1 8.6   CBC:  Recent Labs  01/13/14 0520 01/14/14 0532 01/15/14 0435  WBC 12.7* 12.9* 10.6*  HGB 12.0 12.2 12.1  HCT 36.3 37.0 35.8*  MCV 99.5 98.7 97.0  PLT 190 183 189    DG C-ARM 1-60 MIN - NRPT [facility]; RIGHT HIP - COMPLETE 2+ VIEW   COMPARISON:  None.   FINDINGS: The femoral and acetabular components are well seated. No complicating features are identified.   IMPRESSION: Well seated components of a total right hip arthroplasty.     EXAM: DG C-ARM 1-60 MIN - NRPT [facility]; RIGHT HIP - COMPLETE 2+  VIEW   COMPARISON:  None.   FINDINGS: The femoral and acetabular components are well seated. No complicating features are identified.   IMPRESSION: Well seated components of a total right hip arthroplasty.   PORTABLE RIGHT HIP - 1 VIEW   COMPARISON:  01/12/2014   FINDINGS: Single portable view of the right hip submitted. There is right hip prosthesis in anatomic alignment.   IMPRESSION: Right hip prosthesis in anatomic alignment.     PORTABLE PELVIS 1-2 VIEWS   COMPARISON:  Right hip same day   FINDINGS: Single frontal view of the pelvis submitted. There is right hip prosthesis anatomic alignment. Postsurgical changes with periarticular soft tissue air.   IMPRESSION: Right hip prosthesis in anatomic alignment.    ASSESSMENT/PLAN:  Right hip osteoarthritis -- status post total knee arthroplasty. Continue rehabilitation. Hyperlipidemia-continue Lipitor GERD-continue Protonix Vitamin B12 deficiency-continue supplementation Allergic rhinitis-well controlled Check CBC and BMP  I have reviewed patient's medical records received at admission/from hospitalization.  CPT CODE: 55374  Romina Divirgilio Y Cortana [place], [place] [phone]

## 2014-01-19 ENCOUNTER — Encounter: Payer: Self-pay | Admitting: Adult Health

## 2014-01-19 ENCOUNTER — Non-Acute Institutional Stay (SKILLED_NURSING_FACILITY): Payer: Medicare Other | Admitting: Adult Health

## 2014-01-19 DIAGNOSIS — K219 Gastro-esophageal reflux disease without esophagitis: Secondary | ICD-10-CM

## 2014-01-19 DIAGNOSIS — E7800 Pure hypercholesterolemia, unspecified: Secondary | ICD-10-CM

## 2014-01-19 DIAGNOSIS — F411 Generalized anxiety disorder: Secondary | ICD-10-CM

## 2014-01-19 DIAGNOSIS — M1610 Unilateral primary osteoarthritis, unspecified hip: Secondary | ICD-10-CM

## 2014-01-19 DIAGNOSIS — F419 Anxiety disorder, unspecified: Secondary | ICD-10-CM

## 2014-01-19 DIAGNOSIS — E538 Deficiency of other specified B group vitamins: Secondary | ICD-10-CM

## 2014-01-19 DIAGNOSIS — M1611 Unilateral primary osteoarthritis, right hip: Secondary | ICD-10-CM

## 2014-01-19 DIAGNOSIS — F17200 Nicotine dependence, unspecified, uncomplicated: Secondary | ICD-10-CM

## 2014-01-19 NOTE — Progress Notes (Signed)
Patient ID: Holly Valenzuela, female   DOB: 06/18/1940, 73 y.o.   MRN: [id number]              PROGRESS NOTE  DATE: 01/19/2014   FACILITY: [facility]  LEVEL OF CARE: SNF (31)  Acute Visit  CHIEF COMPLAINT:  Discharge Notes  HISTORY OF PRESENT ILLNESS: This is a 73 year old female who is for discharge home with outpatient rehabilitation. DME: Rolling walker. She has been admitted to [facility] on 01/15/14 from [facility] with Arthritis S/P Right total hip replacement. Patient was admitted to this facility for short-term rehabilitation after the patient's recent hospitalization.  Patient has completed SNF rehabilitation and therapy has cleared the patient for discharge.  Reassessment of ongoing problem(s):  HYPERLIPIDEMIA: No complications from the medications presently being used. Last fasting lipid panel showed : not available  GERD: pt's GERD is stable.  Denies ongoing heartburn, abd. Pain, nausea or vomiting.  Currently on a PPI & tolerates it without any adverse reactions.  ANXIETY: The anxiety remains stable. Patient denies ongoing anxiety or irritability. No complications reported from the medications currently being used.   PAST MEDICAL HISTORY : Reviewed.  No changes/see problem list  CURRENT MEDICATIONS: Reviewed per MAR/see medication list  REVIEW OF SYSTEMS:  GENERAL: no change in appetite, no fatigue, no weight changes, no fever, chills or weakness RESPIRATORY: no cough, SOB, DOE, wheezing, hemoptysis CARDIAC: no chest pain, edema or palpitations GI: no abdominal pain, diarrhea, constipation, heart burn, nausea or vomiting  PHYSICAL EXAMINATION  GENERAL: no acute distress, thin body habitus SKIN:  Right hip surgical incision is dry EYES: conjunctivae normal, sclerae normal, normal eye lids NECK: supple, trachea midline, no neck masses, no thyroid tenderness, no thyromegaly LYMPHATICS: no LAN in the neck, no supraclavicular  LAN RESPIRATORY: breathing is even & unlabored, BS CTAB CARDIAC: RRR, no murmur,no extra heart sounds, no edema GI: abdomen soft, normal BS, no masses, no tenderness, no hepatomegaly, no splenomegaly EXTREMITIES:  Able to move all 4 extremities; ambulates with walker PSYCHIATRIC: the patient is alert & oriented to person, affect & behavior appropriate  LABS/RADIOLOGY: Labs reviewed: Basic Metabolic Panel:  Recent Labs  01/01/14 1450 01/13/14 0520  NA 139 135*  K 4.5 4.3  CL 100 100  CO2 28 26  GLUCOSE 83 135*  BUN 13 9  CREATININE 0.86 0.74  CALCIUM 10.1 8.6   CBC:  Recent Labs  01/13/14 0520 01/14/14 0532 01/15/14 0435  WBC 12.7* 12.9* 10.6*  HGB 12.0 12.2 12.1  HCT 36.3 37.0 35.8*  MCV 99.5 98.7 97.0  PLT 190 183 189    EXAM: DG C-ARM 1-60 MIN - NRPT [facility]; RIGHT HIP - COMPLETE 2+ VIEW   COMPARISON:  None.   FINDINGS: The femoral and acetabular components are well seated. No complicating features are identified.   IMPRESSION: Well seated components of a total right hip arthroplasty. EXAM: DG C-ARM 1-60 MIN - NRPT [facility]; RIGHT HIP - COMPLETE 2+ VIEW   COMPARISON:  None.   FINDINGS: The femoral and acetabular components are well seated. No complicating features are identified.   IMPRESSION: Well seated components of a total right hip arthroplasty. EXAM: PORTABLE RIGHT HIP - 1 VIEW   COMPARISON:  01/12/2014   FINDINGS: Single portable view of the right hip submitted. There is right hip prosthesis in anatomic alignment.   IMPRESSION: Right hip prosthesis in anatomic alignment. EXAM: PORTABLE PELVIS 1-2 VIEWS   COMPARISON:  Right hip same day  FINDINGS: Single frontal view of the pelvis submitted. There is right hip prosthesis anatomic alignment. Postsurgical changes with periarticular soft tissue air.   IMPRESSION: Right hip prosthesis in anatomic alignment.   ASSESSMENT/PLAN:  Arthritis status post right total hip replacement - for  outpatient rehabilitation Hyperlipidemia - continue Lipitor GERD - stable; continue Protonix Anxiety - stable; continue Xanax when necessary Tobacco use disorder - recently started on Nicoderm patch 14 mg/24 hour daily Vitamin B deficiency - continue supplementation    I have filled out patient's discharge paperwork and written prescriptions.  Patient will have outpatient rehabilitation.  DME:  Rolling walker  Total discharge time: Greater than 30 minutes  Discharge time involved coordination of the discharge process with Education officer, museum, nursing staff and therapy department.   CPT CODE: 49449  Seth Bake - NP [facility] [phone]

## 2014-02-16 ENCOUNTER — Other Ambulatory Visit: Payer: Self-pay | Admitting: Nurse Practitioner

## 2014-02-20 DIAGNOSIS — Z23 Encounter for immunization: Secondary | ICD-10-CM | POA: Insufficient documentation

## 2014-04-12 DIAGNOSIS — M545 Low back pain, unspecified: Secondary | ICD-10-CM | POA: Insufficient documentation

## 2014-04-17 ENCOUNTER — Encounter (HOSPITAL_COMMUNITY): Payer: Self-pay | Admitting: *Deleted

## 2014-04-17 ENCOUNTER — Emergency Department (HOSPITAL_COMMUNITY): Payer: Medicare Other

## 2014-04-17 ENCOUNTER — Inpatient Hospital Stay (HOSPITAL_COMMUNITY)
Admission: EM | Admit: 2014-04-17 | Discharge: 2014-04-20 | DRG: 542 | Disposition: A | Discharge status: Home Health | Payer: Medicare Other | Attending: Internal Medicine | Admitting: Internal Medicine

## 2014-04-17 DIAGNOSIS — Z79899 Other long term (current) drug therapy: Secondary | ICD-10-CM

## 2014-04-17 DIAGNOSIS — E860 Dehydration: Secondary | ICD-10-CM | POA: Diagnosis present

## 2014-04-17 DIAGNOSIS — R531 Weakness: Secondary | ICD-10-CM

## 2014-04-17 DIAGNOSIS — E43 Unspecified severe protein-calorie malnutrition: Secondary | ICD-10-CM | POA: Insufficient documentation

## 2014-04-17 DIAGNOSIS — W19XXXA Unspecified fall, initial encounter: Secondary | ICD-10-CM | POA: Diagnosis present

## 2014-04-17 DIAGNOSIS — J439 Emphysema, unspecified: Secondary | ICD-10-CM | POA: Diagnosis present

## 2014-04-17 DIAGNOSIS — M1990 Unspecified osteoarthritis, unspecified site: Secondary | ICD-10-CM | POA: Diagnosis present

## 2014-04-17 DIAGNOSIS — Z96641 Presence of right artificial hip joint: Secondary | ICD-10-CM | POA: Diagnosis present

## 2014-04-17 DIAGNOSIS — M533 Sacrococcygeal disorders, not elsewhere classified: Secondary | ICD-10-CM

## 2014-04-17 DIAGNOSIS — M4858XA Collapsed vertebra, not elsewhere classified, sacral and sacrococcygeal region, initial encounter for fracture: Principal | ICD-10-CM | POA: Diagnosis present

## 2014-04-17 DIAGNOSIS — E559 Vitamin D deficiency, unspecified: Secondary | ICD-10-CM | POA: Diagnosis present

## 2014-04-17 DIAGNOSIS — Z8544 Personal history of malignant neoplasm of other female genital organs: Secondary | ICD-10-CM

## 2014-04-17 DIAGNOSIS — S3210XA Unspecified fracture of sacrum, initial encounter for closed fracture: Secondary | ICD-10-CM

## 2014-04-17 DIAGNOSIS — R627 Adult failure to thrive: Secondary | ICD-10-CM

## 2014-04-17 DIAGNOSIS — F17210 Nicotine dependence, cigarettes, uncomplicated: Secondary | ICD-10-CM | POA: Diagnosis present

## 2014-04-17 DIAGNOSIS — F329 Major depressive disorder, single episode, unspecified: Secondary | ICD-10-CM | POA: Diagnosis present

## 2014-04-17 DIAGNOSIS — E785 Hyperlipidemia, unspecified: Secondary | ICD-10-CM

## 2014-04-17 DIAGNOSIS — R000 Tachycardia, unspecified: Secondary | ICD-10-CM

## 2014-04-17 DIAGNOSIS — M545 Low back pain, unspecified: Secondary | ICD-10-CM | POA: Insufficient documentation

## 2014-04-17 DIAGNOSIS — E539 Vitamin B deficiency, unspecified: Secondary | ICD-10-CM

## 2014-04-17 DIAGNOSIS — Z681 Body mass index (BMI) 19 or less, adult: Secondary | ICD-10-CM

## 2014-04-17 DIAGNOSIS — Z7982 Long term (current) use of aspirin: Secondary | ICD-10-CM

## 2014-04-17 DIAGNOSIS — R52 Pain, unspecified: Secondary | ICD-10-CM

## 2014-04-17 DIAGNOSIS — I10 Essential (primary) hypertension: Secondary | ICD-10-CM | POA: Diagnosis present

## 2014-04-17 DIAGNOSIS — M8448XA Pathological fracture, other site, initial encounter for fracture: Secondary | ICD-10-CM | POA: Diagnosis present

## 2014-04-17 DIAGNOSIS — Z720 Tobacco use: Secondary | ICD-10-CM

## 2014-04-17 DIAGNOSIS — R296 Repeated falls: Secondary | ICD-10-CM | POA: Diagnosis present

## 2014-04-17 LAB — CBC WITH DIFFERENTIAL/PLATELET
BASOS PCT: 1 % (ref 0–1)
Basophils Absolute: 0 10*3/uL (ref 0.0–0.1)
EOS ABS: 0.2 10*3/uL (ref 0.0–0.7)
Eosinophils Relative: 2 % (ref 0–5)
HCT: 42.3 % (ref 36.0–46.0)
Hemoglobin: 13.9 g/dL (ref 12.0–15.0)
Lymphocytes Relative: 33 % (ref 12–46)
Lymphs Abs: 2.8 10*3/uL (ref 0.7–4.0)
MCH: 31.6 pg (ref 26.0–34.0)
MCHC: 32.9 g/dL (ref 30.0–36.0)
MCV: 96.1 fL (ref 78.0–100.0)
MONOS PCT: 7 % (ref 3–12)
Monocytes Absolute: 0.6 10*3/uL (ref 0.1–1.0)
NEUTROS PCT: 57 % (ref 43–77)
Neutro Abs: 5 10*3/uL (ref 1.7–7.7)
Platelets: 360 10*3/uL (ref 150–400)
RBC: 4.4 MIL/uL (ref 3.87–5.11)
RDW: 13.6 % (ref 11.5–15.5)
WBC: 8.5 10*3/uL (ref 4.0–10.5)

## 2014-04-17 LAB — URINALYSIS, ROUTINE W REFLEX MICROSCOPIC
BILIRUBIN URINE: NEGATIVE
Glucose, UA: NEGATIVE mg/dL
Glucose, UA: NEGATIVE mg/dL
Hgb urine dipstick: NEGATIVE
Ketones, ur: NEGATIVE mg/dL
Ketones, ur: NEGATIVE mg/dL
Leukocytes, UA: NEGATIVE
Leukocytes, UA: NEGATIVE
NITRITE: NEGATIVE
NITRITE: NEGATIVE
Protein, ur: NEGATIVE mg/dL
Protein, ur: NEGATIVE mg/dL
Specific Gravity, Urine: 1.01 (ref 1.005–1.030)
Specific Gravity, Urine: 1.016 (ref 1.005–1.030)
UROBILINOGEN UA: 0.2 mg/dL (ref 0.0–1.0)
Urobilinogen, UA: 0.2 mg/dL (ref 0.0–1.0)
pH: 6.5 (ref 5.0–8.0)
pH: 7 (ref 5.0–8.0)

## 2014-04-17 LAB — COMPREHENSIVE METABOLIC PANEL
ALT: 9 U/L (ref 0–35)
ANION GAP: 12 (ref 5–15)
AST: 17 U/L (ref 0–37)
Albumin: 3.4 g/dL — ABNORMAL LOW (ref 3.5–5.2)
Alkaline Phosphatase: 178 U/L — ABNORMAL HIGH (ref 39–117)
BUN: 13 mg/dL (ref 6–23)
CO2: 27 mEq/L (ref 19–32)
Calcium: 9.2 mg/dL (ref 8.4–10.5)
Chloride: 101 mEq/L (ref 96–112)
Creatinine, Ser: 0.73 mg/dL (ref 0.50–1.10)
GFR calc Af Amer: >90 mL/min (ref >90)
GFR calc non Af Amer: 82 mL/min — ABNORMAL LOW (ref >90)
Glucose, Bld: 82 mg/dL (ref 70–99)
POTASSIUM: 4 meq/L (ref 3.7–5.3)
Sodium: 140 mEq/L (ref 137–147)
TOTAL PROTEIN: 6.5 g/dL (ref 6.0–8.3)
Total Bilirubin: 0.2 mg/dL — ABNORMAL LOW (ref 0.3–1.2)

## 2014-04-17 LAB — URINE MICROSCOPIC-ADD ON

## 2014-04-17 LAB — TSH: TSH: 1.02 u[IU]/mL (ref 0.350–4.500)

## 2014-04-17 MED ORDER — VITAMIN D3 25 MCG (1000 UNIT) PO TABS
1000.0000 [IU] | ORAL_TABLET | Freq: Every day | ORAL | Status: DC
  Administered 2014-04-18 – 2014-04-20 (×3): 1000 [IU] via ORAL
  Filled 2014-04-17 (×3): qty 1

## 2014-04-17 MED ORDER — SODIUM CHLORIDE 0.9 % IJ SOLN: 3.0000 mL | Freq: Two times a day (BID) | INTRAMUSCULAR | Status: DC

## 2014-04-17 MED ORDER — ONDANSETRON HCL 4 MG/2ML IJ SOLN
4.0000 mg | Freq: Four times a day (QID) | INTRAMUSCULAR | Status: DC | PRN
  Administered 2014-04-18: 4 mg via INTRAVENOUS
  Filled 2014-04-17: qty 2

## 2014-04-17 MED ORDER — NICOTINE 14 MG/24HR TD PT24
14.0000 mg | MEDICATED_PATCH | Freq: Every day | TRANSDERMAL | Status: DC
  Administered 2014-04-17: 14 mg via TRANSDERMAL
  Filled 2014-04-17 (×2): qty 1

## 2014-04-17 MED ORDER — ONDANSETRON HCL 4 MG/2ML IJ SOLN
4.0000 mg | Freq: Once | INTRAMUSCULAR | Status: AC
  Administered 2014-04-17: 4 mg via INTRAVENOUS
  Filled 2014-04-17: qty 2

## 2014-04-17 MED ORDER — SODIUM CHLORIDE 0.9 % IV BOLUS (SEPSIS)
500.0000 mL | Freq: Once | INTRAVENOUS | Status: AC
  Administered 2014-04-17: 500 mL via INTRAVENOUS

## 2014-04-17 MED ORDER — MORPHINE SULFATE 2 MG/ML IJ SOLN
2.0000 mg | Freq: Once | INTRAMUSCULAR | Status: AC
Start: 2014-04-17 — End: 2014-04-17
  Administered 2014-04-17: 2 mg via INTRAVENOUS
  Filled 2014-04-17: qty 1

## 2014-04-17 MED ORDER — ASPIRIN EC 325 MG PO TBEC
325.0000 mg | DELAYED_RELEASE_TABLET | Freq: Every day | ORAL | Status: DC
  Administered 2014-04-18 – 2014-04-20 (×3): 325 mg via ORAL
  Filled 2014-04-17 (×3): qty 1

## 2014-04-17 MED ORDER — MORPHINE SULFATE 4 MG/ML IJ SOLN
3.0000 mg | INTRAMUSCULAR | Status: DC | PRN
  Administered 2014-04-17 – 2014-04-18 (×2): 3 mg via INTRAVENOUS
  Filled 2014-04-17 (×2): qty 1

## 2014-04-17 MED ORDER — ONDANSETRON HCL 4 MG PO TABS: 4.0000 mg | ORAL_TABLET | Freq: Four times a day (QID) | ORAL | Status: DC | PRN

## 2014-04-17 MED ORDER — SODIUM CHLORIDE 0.9 % IV SOLN
INTRAVENOUS | Status: DC
  Administered 2014-04-17 – 2014-04-20 (×6): via INTRAVENOUS

## 2014-04-17 MED ORDER — ADULT MULTIVITAMIN W/MINERALS CH
1.0000 | ORAL_TABLET | Freq: Every evening | ORAL | Status: DC
  Administered 2014-04-18 – 2014-04-19 (×2): 1 via ORAL
  Filled 2014-04-17 (×3): qty 1

## 2014-04-17 MED ORDER — CYANOCOBALAMIN 1000 MCG/ML IJ SOLN: 1000.0000 ug | INTRAMUSCULAR | Status: DC

## 2014-04-17 MED ORDER — ATORVASTATIN CALCIUM 10 MG PO TABS
10.0000 mg | ORAL_TABLET | Freq: Every day | ORAL | Status: DC
  Administered 2014-04-18 – 2014-04-20 (×3): 10 mg via ORAL
  Filled 2014-04-17 (×3): qty 1

## 2014-04-17 MED ORDER — PSEUDOEPHEDRINE HCL 30 MG PO TABS
30.0000 mg | ORAL_TABLET | ORAL | Status: DC | PRN
  Administered 2014-04-19: 30 mg via ORAL
  Filled 2014-04-17 (×3): qty 1

## 2014-04-17 MED ORDER — ALPRAZOLAM 0.5 MG PO TABS
0.5000 mg | ORAL_TABLET | Freq: Every day | ORAL | Status: DC
  Administered 2014-04-17 – 2014-04-19 (×3): 0.5 mg via ORAL
  Filled 2014-04-17 (×3): qty 1

## 2014-04-17 MED ORDER — PANTOPRAZOLE SODIUM 40 MG PO TBEC
40.0000 mg | DELAYED_RELEASE_TABLET | Freq: Every day | ORAL | Status: DC
  Administered 2014-04-18 – 2014-04-20 (×3): 40 mg via ORAL
  Filled 2014-04-17 (×4): qty 1

## 2014-04-17 MED ORDER — MORPHINE SULFATE 4 MG/ML IJ SOLN
4.0000 mg | Freq: Once | INTRAMUSCULAR | Status: AC
  Administered 2014-04-17: 4 mg via INTRAVENOUS
  Filled 2014-04-17: qty 1

## 2014-04-17 MED ORDER — OXYCODONE-ACETAMINOPHEN 5-325 MG PO TABS
1.0000 | ORAL_TABLET | ORAL | Status: DC | PRN
  Administered 2014-04-17 – 2014-04-20 (×12): 2 via ORAL
  Filled 2014-04-17 (×12): qty 2

## 2014-04-17 MED ORDER — FLUTICASONE PROPIONATE 50 MCG/ACT NA SUSP
1.0000 | Freq: Every day | NASAL | Status: DC | PRN
  Filled 2014-04-17: qty 16

## 2014-04-17 MED ORDER — ENOXAPARIN SODIUM 30 MG/0.3ML ~~LOC~~ SOLN
30.0000 mg | SUBCUTANEOUS | Status: DC
  Administered 2014-04-17 – 2014-04-18 (×2): 30 mg via SUBCUTANEOUS
  Filled 2014-04-17 (×3): qty 0.3

## 2014-04-17 MED ORDER — MORPHINE SULFATE 4 MG/ML IJ SOLN: 3.0000 mg | INTRAMUSCULAR | Status: DC | PRN

## 2014-04-17 MED ORDER — MORPHINE SULFATE 2 MG/ML IJ SOLN
2.0000 mg | Freq: Once | INTRAMUSCULAR | Status: AC
  Administered 2014-04-17: 2 mg via INTRAVENOUS
  Filled 2014-04-17: qty 1

## 2014-04-17 MED ORDER — VITAMINS A & D EX OINT
TOPICAL_OINTMENT | CUTANEOUS | Status: AC
  Administered 2014-04-17: 23:00:00
  Filled 2014-04-17: qty 5

## 2014-04-17 NOTE — ED Notes (Signed)
Bed: WA17 Expected date:  Expected time:  Means of arrival:  Comments: EMS 

## 2014-04-17 NOTE — ED Notes (Signed)
Patient is unable to urinate 

## 2014-04-17 NOTE — ED Notes (Signed)
Per EMS-states patient had MRI on Fri and it showed 2 lumbar fractures-having left hip pain-was told to come to ED for evaluation-pt has taken home oxycontin prior to arrival

## 2014-04-17 NOTE — H&P (Addendum)
[facility] History and Physical  Holly Valenzuela [facility] ZOX:[id number] DOB: 10-Jul-1940 DOA: 04/17/2014  Referring physician: ED physician PCP: Jerlyn Ly, MD   Chief Complaint: weakness and FTT  HPI:  73 year old female with HLD, vit D deficiency, frail and with history of frequent falls, presented to [facility] ED with main concern of several days duration of progressively worsening low back pain, throbbing and constant, 7/10 in severity, occasionally, not consistently radiating to bilateral LE. No specific alleviating factors, worse with weight bearing and exertion. This has been associated with frequent falls, generalized malaise and failure to thrive. No chest pain or shortness of breath, no specific abd or urinary concerns, no numbness or tingling.   In ED, pt is hemodynamically stable, VSS, blood work unremarkable. PT very thin and frail. TRH asked to admit for pain control and further evaluation. PT scheduled for sacroplasty today so IR consulted for potential inpatient procedure.   Assessment and Plan: Active Problems: Frequent falls, FTT - admit to tele to rule out cardiac etiology, possible d/c tele in AM - likely secondary to severe malnutrition and overall frail condition - will provide hydration and reassess in AM - PT/OT evaluation once pt able to tolerate - IR consult for sacroplasty requested, appreciate input  Tachycardia - likely from dehydration, will still monitor on tele until AM - provide IVF and repeat BMP in AM HLD - continue statin  Vit D deficiency - continue home medical regimen Severe PCM - nutritionist consulted Emphysema - pt requesting nicotine patch - oxygen stable on admission  Underweight, BMI < 17    Lovenox SQ for DVT prophylaxis   Radiological Exams on Admission: Dg Chest 2 View  04/17/2014   hronic bronchitic and emphysematous changes but no acute overlying pulmonary process.      Code Status: Full Family Communication: Pt at  bedside Disposition Plan: Admit for further evaluation     Review of Systems:  Constitutional: Negative for fever, chills and malaise/fatigue. Negative for diaphoresis.  HENT: Negative for hearing loss, ear pain, nosebleeds, congestion, sore throat, neck pain, tinnitus and ear discharge.   Eyes: Negative for blurred vision, double vision, photophobia, pain, discharge and redness.  Respiratory: Negative for cough, hemoptysis, sputum production, shortness of breath, wheezing and stridor.   Cardiovascular: Negative for chest pain, palpitations, orthopnea, claudication and leg swelling.  Gastrointestinal: Negative for nausea, vomiting and abdominal pain.  Genitourinary: Negative for dysuria, urgency, frequency, hematuria and flank pain.  Musculoskeletal: Negative for myalgias.  Skin: Negative for itching and rash.  Neurological: Per HPI  Endo/Heme/Allergies: Negative for environmental allergies and polydipsia. Does not bruise/bleed easily.  Psychiatric/Behavioral: Negative for suicidal ideas. The patient is not nervous/anxious.      Past Medical History  Diagnosis Date  . Complication of anesthesia     SEVERE nausea vomiting  . Hyperlipidemia   . Headache(784.0)     HX MIGRAINES  . Arthritis   . Cancer     HX ENDOMETRIAL CANCER   . Depression     Past Surgical History  Procedure Laterality Date  . Cesarean section    . Appendectomy    . Thyroid surgery    . Abdominal hysterectomy    . Total hip arthroplasty Right 01/12/2014    Procedure: RIGHT TOTAL HIP ARTHROPLASTY ANTERIOR APPROACH;  Surgeon: Mcarthur Rossetti, MD;  Location: [facility];  Service: Orthopedics;  Laterality: Right;    Social History:  reports that she has been smoking Cigarettes.  She has been smoking about  0.00 packs per day. She has never used smokeless tobacco. She reports that she does not drink alcohol or use illicit drugs.  Allergies  Allergen Reactions  . Dilaudid [Hydromorphone Hcl] Nausea And  Vomiting  . Adhesive [Tape] Itching  . Codeine Nausea And Vomiting    No family history on file.  Prior to Admission medications   Medication Sig Start Date End Date Taking? Authorizing Provider  ALPRAZolam Duanne Moron) 0.5 MG tablet Take 0.5 mg by mouth at bedtime.    Yes Historical Provider, MD  aspirin EC 325 MG EC tablet Take 1 tablet (325 mg total) by mouth 2 (two) times daily after a meal. Patient taking differently: Take 325 mg by mouth daily.  01/14/14  Yes Erskine Emery, PA-C  atorvastatin (LIPITOR) 10 MG tablet Take 10 mg by mouth daily.   Yes Historical Provider, MD  cholecalciferol (VITAMIN D) 1000 UNITS tablet Take 1,000 Units by mouth daily.   Yes Historical Provider, MD  cyanocobalamin (,VITAMIN B-12,) 1000 MCG/ML injection Inject 1,000 mcg into the muscle every 30 (thirty) days.    Yes Historical Provider, MD  fluticasone (FLONASE) 50 MCG/ACT nasal spray Place 1 spray into both nostrils daily as needed for allergies or rhinitis.   Yes Historical Provider, MD  Multiple Vitamin (MULTIVITAMIN WITH MINERALS) TABS Take 1 tablet by mouth every evening.    Yes Historical Provider, MD  oxyCODONE-acetaminophen (ROXICET) 5-325 MG per tablet Take 1-2 tablets by mouth every 4 (four) hours as needed for severe pain. 01/14/14  Yes Erskine Emery, PA-C  pantoprazole (PROTONIX) 40 MG tablet Take 40 mg by mouth daily.   Yes Historical Provider, MD  pseudoephedrine (SUDAFED) 30 MG tablet Take 30 mg by mouth every 4 (four) hours as needed for congestion.   Yes Historical Provider, MD    Physical Exam: Filed Vitals:   04/17/14 1050 04/17/14 1354  BP: 149/92 110/78  Pulse: 85 81  Temp: 98.1 F (36.7 C)   TempSrc: Oral   Resp: 16 19  SpO2: 96% 99%    Physical Exam  Constitutional: Appears well-developed and well-nourished. No distress.  HENT: Normocephalic. External right and left ear normal. Dry MM Eyes: Conjunctivae and EOM are normal. PERRLA, no scleral icterus.  Neck: Normal ROM. Neck  supple. No JVD. No tracheal deviation. No thyromegaly.  CVS: RRR, S1/S2 +, no gallops, no carotid bruit.  Pulmonary: Effort and breath sounds normal, no stridor, rhonchi, wheezes, rales.  Abdominal: Soft. BS +,  no distension, tenderness, rebound or guarding.  Musculoskeletal: Normal range of motion. No edema and no tenderness.  Lymphadenopathy: No lymphadenopathy noted, cervical, inguinal. Neuro: Alert. Normal reflexes, muscle tone coordination. No cranial nerve deficit. Skin: Skin is warm and dry. No rash noted. Not diaphoretic. No erythema. No pallor.  Psychiatric: Normal mood and affect.   Labs on Admission:  Basic Metabolic Panel:  Recent Labs Lab 04/17/14 1210  NA 140  K 4.0  CL 101  CO2 27  GLUCOSE 82  BUN 13  CREATININE 0.73  CALCIUM 9.2   Liver Function Tests:  Recent Labs Lab 04/17/14 1210  AST 17  ALT 9  ALKPHOS 178*  BILITOT 0.2*  PROT 6.5  ALBUMIN 3.4*   CBC:  Recent Labs Lab 04/17/14 1210  WBC 8.5  NEUTROABS 5.0  HGB 13.9  HCT 42.3  MCV 96.1  PLT 360   EKG: Normal sinus rhythm, no ST/T wave changes  Faye Ramsay, MD  [facility] Pager [phone]  If 7PM-7AM, please contact night-coverage [email or web address]  Password [email or web address] 04/17/2014, 2:25 PM

## 2014-04-17 NOTE — ED Provider Notes (Signed)
CSN: [id number]     Arrival date & time 04/17/14  1047 History   First MD Initiated Contact with Patient 04/17/14 1143     Chief Complaint  Patient presents with  . Hip Pain     (Consider location/radiation/quality/duration/timing/severity/associated sxs/prior Treatment) HPI Comments: Patient is a 73 year old female who presents with low back pain.  She was diagnosed with compression fractures by MRI on Friday.  She came here by EMS today with complaints of low back pain. After arriving in the emergency department, she found out that she was scheduled for a sacro-plasty performed today at [facility] at 2:00.  According to the patient's daughter, she has been having difficulty at home and her daughter does not feel as though she is safe there. I also spoke with Dr. [name] who is the patient's primary care physician who agrees with this assessment and is recommending admission.  Patient is a 73 y.o. female presenting with hip pain. The history is provided by the patient.  Hip Pain This is a new problem. Episode onset: one month ago. The problem occurs constantly. The problem has been gradually worsening. The symptoms are aggravated by walking. Nothing relieves the symptoms. She has tried nothing for the symptoms. The treatment provided no relief.    Past Medical History  Diagnosis Date  . Complication of anesthesia     SEVERE nausea vomiting  . Hyperlipidemia   . Headache(784.0)     HX MIGRAINES  . Arthritis   . Cancer     HX ENDOMETRIAL CANCER   . Depression    Past Surgical History  Procedure Laterality Date  . Cesarean section    . Appendectomy    . Thyroid surgery    . Abdominal hysterectomy    . Total hip arthroplasty Right 01/12/2014    Procedure: RIGHT TOTAL HIP ARTHROPLASTY ANTERIOR APPROACH;  Surgeon: Mcarthur Rossetti, MD;  Location: [facility];  Service: Orthopedics;  Laterality: Right;   No family history on file. History  Substance Use Topics  . Smoking status: Current  Every Day Smoker    Types: Cigarettes  . Smokeless tobacco: Never Used  . Alcohol Use: No   OB History    No data available     Review of Systems  All other systems reviewed and are negative.     Allergies  Dilaudid; Adhesive; and Codeine  Home Medications   Prior to Admission medications   Medication Sig Start Date End Date Taking? Authorizing Provider  ALPRAZolam Duanne Moron) 0.5 MG tablet Take 0.5 mg by mouth at bedtime.    Yes Historical Provider, MD  aspirin EC 325 MG EC tablet Take 1 tablet (325 mg total) by mouth 2 (two) times daily after a meal. Patient taking differently: Take 325 mg by mouth daily.  01/14/14  Yes Erskine Emery, PA-C  atorvastatin (LIPITOR) 10 MG tablet Take 10 mg by mouth daily.   Yes Historical Provider, MD  cholecalciferol (VITAMIN D) 1000 UNITS tablet Take 1,000 Units by mouth daily.   Yes Historical Provider, MD  cyanocobalamin (,VITAMIN B-12,) 1000 MCG/ML injection Inject 1,000 mcg into the muscle every 30 (thirty) days.    Yes Historical Provider, MD  fluticasone (FLONASE) 50 MCG/ACT nasal spray Place 1 spray into both nostrils daily as needed for allergies or rhinitis.   Yes Historical Provider, MD  Multiple Vitamin (MULTIVITAMIN WITH MINERALS) TABS Take 1 tablet by mouth every evening.    Yes Historical Provider, MD  oxyCODONE-acetaminophen (ROXICET) 5-325 MG per tablet  Take 1-2 tablets by mouth every 4 (four) hours as needed for severe pain. 01/14/14  Yes Erskine Emery, PA-C  pantoprazole (PROTONIX) 40 MG tablet Take 40 mg by mouth daily.   Yes Historical Provider, MD  pseudoephedrine (SUDAFED) 30 MG tablet Take 30 mg by mouth every 4 (four) hours as needed for congestion.   Yes Historical Provider, MD   BP 149/92 mmHg  Pulse 85  Temp(Src) 98.1 F (36.7 C) (Oral)  Resp 16  SpO2 96% Physical Exam  Constitutional: She is oriented to person, place, and time. No distress.  Patient is a 73 year old female who is very thin and cachectic.  HENT:   Head: Normocephalic and atraumatic.  Neck: Normal range of motion. Neck supple.  Cardiovascular: Normal rate and regular rhythm.  Exam reveals no gallop and no friction rub.   No murmur heard. Pulmonary/Chest: Effort normal and breath sounds normal. No respiratory distress. She has no wheezes.  Abdominal: Soft. Bowel sounds are normal. She exhibits no distension. There is no tenderness.  Musculoskeletal: Normal range of motion.  Neurological: She is alert and oriented to person, place, and time.  Skin: Skin is warm and dry. She is not diaphoretic.  Nursing note and vitals reviewed.   ED Course  Procedures (including critical care time) Labs Review Labs Reviewed - No data to display  Imaging Review No results found.   EKG Interpretation None      MDM   Final diagnoses:  Sacral pain  Sacral fracture, closed, initial encounter    Patient will be admitted to the hospital for pain control and further evaluation by interventional radiology to discuss sacro plasty.  Dr. Doyle Askew to admit.    Veryl Speak, MD 04/17/14 1520

## 2014-04-17 NOTE — Progress Notes (Signed)
Patient ID: Holly Valenzuela, female   DOB: May 23, 1941, 73 y.o.   MRN: [id number] Request received for sacroplasty on pt. She had MRI done at [place] recently but left the disc at home. She was tent scheduled for consultation with Dr. Estanislado Pandy on 11/23 as OP. Once we have disc to review we can assess for sacroplasty candidacy.

## 2014-04-18 DIAGNOSIS — E559 Vitamin D deficiency, unspecified: Secondary | ICD-10-CM

## 2014-04-18 DIAGNOSIS — E860 Dehydration: Secondary | ICD-10-CM

## 2014-04-18 DIAGNOSIS — R000 Tachycardia, unspecified: Secondary | ICD-10-CM

## 2014-04-18 DIAGNOSIS — I471 Supraventricular tachycardia: Secondary | ICD-10-CM

## 2014-04-18 DIAGNOSIS — W19XXXA Unspecified fall, initial encounter: Secondary | ICD-10-CM

## 2014-04-18 DIAGNOSIS — Z720 Tobacco use: Secondary | ICD-10-CM

## 2014-04-18 DIAGNOSIS — E785 Hyperlipidemia, unspecified: Secondary | ICD-10-CM

## 2014-04-18 DIAGNOSIS — R627 Adult failure to thrive: Secondary | ICD-10-CM

## 2014-04-18 LAB — BASIC METABOLIC PANEL
Anion gap: 11 (ref 5–15)
BUN: 17 mg/dL (ref 6–23)
CALCIUM: 8.9 mg/dL (ref 8.4–10.5)
CO2: 26 meq/L (ref 19–32)
Chloride: 102 mEq/L (ref 96–112)
Creatinine, Ser: 0.78 mg/dL (ref 0.50–1.10)
GFR calc Af Amer: >90 mL/min (ref >90)
GFR calc non Af Amer: 80 mL/min — ABNORMAL LOW (ref >90)
GLUCOSE: 124 mg/dL — AB (ref 70–99)
POTASSIUM: 4 meq/L (ref 3.7–5.3)
Sodium: 139 mEq/L (ref 137–147)

## 2014-04-18 LAB — CBC
HEMATOCRIT: 38.2 % (ref 36.0–46.0)
Hemoglobin: 12.4 g/dL (ref 12.0–15.0)
MCH: 31.8 pg (ref 26.0–34.0)
MCHC: 32.5 g/dL (ref 30.0–36.0)
MCV: 97.9 fL (ref 78.0–100.0)
Platelets: 300 10*3/uL (ref 150–400)
RBC: 3.9 MIL/uL (ref 3.87–5.11)
RDW: 13.8 % (ref 11.5–15.5)
WBC: 7.6 10*3/uL (ref 4.0–10.5)

## 2014-04-18 MED ORDER — NICOTINE 7 MG/24HR TD PT24
7.0000 mg | MEDICATED_PATCH | Freq: Every day | TRANSDERMAL | Status: DC
  Administered 2014-04-19 – 2014-04-20 (×3): 7 mg via TRANSDERMAL
  Filled 2014-04-18 (×3): qty 1

## 2014-04-18 MED ORDER — ENSURE COMPLETE PO LIQD
237.0000 mL | Freq: Two times a day (BID) | ORAL | Status: DC
  Administered 2014-04-19 (×2): 237 mL via ORAL

## 2014-04-18 MED ORDER — MORPHINE SULFATE 2 MG/ML IJ SOLN
2.0000 mg | INTRAMUSCULAR | Status: AC | PRN
  Administered 2014-04-18: 2 mg via INTRAVENOUS
  Filled 2014-04-18: qty 1

## 2014-04-18 MED ORDER — MORPHINE SULFATE 2 MG/ML IJ SOLN
2.0000 mg | INTRAMUSCULAR | Status: DC | PRN
  Administered 2014-04-18 – 2014-04-20 (×6): 2 mg via INTRAVENOUS
  Filled 2014-04-18 (×7): qty 1

## 2014-04-18 NOTE — Progress Notes (Signed)
OT Cancellation Note  Patient Details Name: Holly Valenzuela MRN: [id number] DOB: Feb 07, 1941   Cancelled Treatment:    Reason Eval/Treat Not Completed: Other (comment) Spoke to PT and pt only tolerating some standing at EOB due to pain and that depending on pain, has been up to [facility]. Will defer OT eval for today and check back on pt next date and note IR following.   Pauline Aus [place] 04/18/2014, 11:32 AM

## 2014-04-18 NOTE — Progress Notes (Signed)
INITIAL NUTRITION ASSESSMENT  DOCUMENTATION CODES Per approved criteria  -Severe malnutrition in the context of chronic illness -Underweight  Pt meets criteria for severe MALNUTRITION in the context of chronic illness as evidenced by PO intake < 75% for > one month, 11% body weight loss in < 3 months, severe muscle wasting and subcutaneous fat loss in multiple body regions.   INTERVENTION: -Recommend Ensure Complete po BID, each supplement provides 350 kcal and 13 grams of protein -Recommend HS nourishment of half [place] sandwich and MagicCup -Consider addition of appetite stimulant if PO intake does not improve with pain management -Encouraged intake of snacks every 2-3 hours; discussed importance of nutritional status for recovery -RD to continue to monitor  NUTRITION DIAGNOSIS: Inadequate oral intake related to early satiety/pain/limited mobility as evidenced by PO intake < 75%, 11% body weight loss in < 3 months.   Goal: Pt to meet >/= 90% of their estimated nutrition needs    Monitor:  Total protein/energy intake, labs, weights, supplement acceptance, education needs  Reason for Assessment: MST/Underweight BMI/Consult to Assess  73 y.o. female  Admitting Dx: Fall  ASSESSMENT: 73 year old female with HLD, vit D deficiency, frail and with history of frequent falls, presented to [facility] ED with main concern of several days duration of progressively worsening low back pain, throbbing and constant, 7/10 in severity, occasionally, not consistently radiating to bilateral LE.  -Pt reported decreased appetite and intake for past one month d/t feelings of early satiety, pain, and subsequent limited mobility.  -Diet recall indicates pt consuming one [employer] brand supplement daily with small meal once of twice daily depending on when she felt hungry. Will normally eat banana for breakfast, and an easy to fix lunch of dinner d/t decreased ability to cook or prepare meals for self -Pt reported  decreased appetite also d/t limited energy expenditure and overall disinterest in cooking for self when she is home alone -Endorsed ongoing weight loss. Usual body weight around 110 lbs. Per previous medical records, pt 100 lb in 12/2013 post hip surgery, indicating 11 lbs wt loss in 3 months (11% body weight loss, severe for time frame) -Reviewed importance of nutrition for recovery and post-op needs -Pt willing to consume Ensure BID as well as consume MagicCup protein supplement w/half sandwich as nighttime snack when pt feels most hungry  Height: Ht Readings from Last 1 Encounters:  04/17/14 5\' 5"  (1.651 m)    Weight: Wt Readings from Last 1 Encounters:  04/18/14 89 lb 8 oz (40.597 kg)    Ideal Body Weight: 125 lb  % Ideal Body Weight: 88%  Wt Readings from Last 10 Encounters:  04/18/14 89 lb 8 oz (40.597 kg)  01/19/14 100 lb 6.4 oz (45.541 kg)  01/12/14 95 lb (43.092 kg)  01/01/14 95 lb 6 oz (43.262 kg)  11/21/11 101 lb 9.6 oz (46.085 kg)    Usual Body Weight: 100-110  % Usual Body Weight: 89-81%  BMI:  Body mass index is 14.89 kg/(m^2). Underweight  Estimated Nutritional Needs: Kcal: 1250-1450 Protein: 60-70 gram Fluid: >/= 1300 ml daily  Skin: WDL  Diet Order: Diet regular  EDUCATION NEEDS: -Education needs addressed   Intake/Output Summary (Last 24 hours) at 04/18/14 1521 Last data filed at 04/18/14 1300  Gross per 24 hour  Intake 1428.75 ml  Output   1000 ml  Net 428.75 ml    Last BM: PTA   Labs:   Recent Labs Lab 04/17/14 1210 04/18/14 0345  NA 140 139  K 4.0 4.0  CL 101 102  CO2 27 26  BUN 13 17  CREATININE 0.73 0.78  CALCIUM 9.2 8.9  GLUCOSE 82 124*    CBG (last 3)  No results for input(s): GLUCAP in the last 72 hours.  Scheduled Meds: . ALPRAZolam  0.5 mg Oral QHS  . aspirin EC  325 mg Oral Daily  . atorvastatin  10 mg Oral Daily  . cholecalciferol  1,000 Units Oral Daily  . [START ON 05/12/2014] cyanocobalamin  1,000 mcg  Intramuscular Q30 days  . enoxaparin (LOVENOX) injection  30 mg Subcutaneous Q24H  . feeding supplement (ENSURE COMPLETE)  237 mL Oral BID BM  . multivitamin with minerals  1 tablet Oral QPM  . nicotine  14 mg Transdermal Daily  . pantoprazole  40 mg Oral QAC breakfast  . sodium chloride  3 mL Intravenous Q12H    Continuous Infusions: . sodium chloride 75 mL/hr at 04/18/14 7711    Past Medical History  Diagnosis Date  . Complication of anesthesia     SEVERE nausea vomiting  . Hyperlipidemia   . Headache(784.0)     HX MIGRAINES  . Arthritis   . Cancer     HX ENDOMETRIAL CANCER   . Depression     Past Surgical History  Procedure Laterality Date  . Cesarean section    . Appendectomy    . Thyroid surgery    . Abdominal hysterectomy    . 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

## 2014-04-18 NOTE — Progress Notes (Signed)
Patient ID: Holly Valenzuela, female   DOB: 1940/10/19, 73 y.o.   MRN: [id number]   CD from [place] is being sent by courier to Dr Estanislado Pandy at [facility] today Plan to review and set appropriate action per Dr Estanislado Pandy  Will keep RN and pt informed

## 2014-04-18 NOTE — Progress Notes (Signed)
[facility]                                                                              Patient Demographics  Holly Valenzuela, is a 73 y.o. female, DOB - [birth year], YQI:[id number]  Admit date - 04/17/2014   Admitting Physician Theodis Blaze, MD  Outpatient Primary MD for the patient is Jerlyn Ly, MD  LOS - 1   Chief Complaint  Patient presents with  . Hip Pain      History of present illness on 04/17/2014 73 year old female with history of hypertension, vitamin D deficiency, frail with history of frequent falls presented to the emergency department with concerns of several days of progressively worsening lower back pain, throbbing and constant, 7/10 severity, with some pain radiating to her lower extremities bilaterally.  No specific alleviating factors, pain made worse with weightbearing exertion. Patient has had generalized malaise as well as failure to thrive. She denied chest pain, shortness of breath, abdominal pain, numbness or tubing upon admission. In the emergency department patient was hemodynamically stable, vital signs are stable, blood work unremarkable. Hearing she was asked to admit for pain control and further evaluation. Of note patient was scheduled for sacral plasty with IR as an outpatient procedure.  Assessment & Plan   Frequent falls -likely complicated by failure to thrive -Patient was placed on telemetry however no arrhythmias noted will DC telemetry -PT and OT consulted and pending evaluation  Back pain -Patient was scheduled for sacroplasty -interventional radiology consulted and appreciated -Pending review of patient's records, IR will make decision regarding intervention during hospital stay  Failure to thrive/BMI less than 17 -will consult nutrition -possibly secondary patient's back pain if she is unable to care for herself at home.  Tachycardia -Likely secondary to dehydration  Hyperlipidemia -continue statin  Vitamin D  deficiency -Continue vitamin D supplementation  Tobacco abuse -Continue nicotine patch -Smoking cessation counseling given  Code Status: Full  Family Communication: none at bedside  Disposition Plan: admitted  Time Spent in minutes   30 minutes  Procedures  none  Consults   Interventional radiology  DVT Prophylaxis  Lovenox  Lab Results  Component Value Date   PLT 300 04/18/2014    Medications  Scheduled Meds: . ALPRAZolam  0.5 mg Oral QHS  . aspirin EC  325 mg Oral Daily  . atorvastatin  10 mg Oral Daily  . cholecalciferol  1,000 Units Oral Daily  . [START ON 05/12/2014] cyanocobalamin  1,000 mcg Intramuscular Q30 days  . enoxaparin (LOVENOX) injection  30 mg Subcutaneous Q24H  . multivitamin with minerals  1 tablet Oral QPM  . nicotine  14 mg Transdermal Daily  . pantoprazole  40 mg Oral QAC breakfast  . sodium chloride  3 mL Intravenous Q12H   Continuous Infusions: . sodium chloride 75 mL/hr at 04/18/14 0620   PRN Meds:.fluticasone, ondansetron **OR** ondansetron (ZOFRAN) IV, oxyCODONE-acetaminophen, pseudoephedrine  Antibiotics    Anti-infectives    None        Subjective:   [name] seen and examined today.  Patient continues to complain of pain however states pain medications do help. She denies any chest pain, palpitations, shortness of breath, dizziness,  headache, abdominal pain.   Objective:   Filed Vitals:   04/17/14 1551 04/17/14 2117 04/18/14 0423 04/18/14 1334  BP: 147/89 145/90 136/81 137/70  Pulse: 104 80 77 67  Temp: 98.1 F (36.7 C) 98.6 F (37 C) 97.7 F (36.5 C) 98.6 F (37 C)  TempSrc: Oral Oral Oral Oral  Resp: 18 18 16 16   Height: 5\' 5"  (1.651 m)     Weight: 39.599 kg (87 lb 4.8 oz)  40.597 kg (89 lb 8 oz)   SpO2: 98% 97% 95% 95%    Wt Readings from Last 3 Encounters:  04/18/14 40.597 kg (89 lb 8 oz)  01/19/14 45.541 kg (100 lb 6.4 oz)  01/12/14 43.092 kg (95 lb)     Intake/Output Summary (Last 24  hours) at 04/18/14 1402 Last data filed at 04/18/14 1300  Gross per 24 hour  Intake 1428.75 ml  Output   1000 ml  Net 428.75 ml    Exam  General: Well developed, thin, NAD, appears stated age  HEENT: NCAT, mucous membranes moist.   Cardiovascular: S1 S2 auscultated, no rubs, murmurs or gallops. Regular rate and rhythm.  Respiratory: Clear to auscultation bilaterally with equal chest rise  Abdomen: Soft, nontender, nondistended, + bowel sounds  Extremities: warm dry without cyanosis clubbing or edema  Neuro: AAOx3, no focal deficits  Skin: Without rashes exudates or nodules  Psych: Normal affect and demeanor with intact judgement and insight  Data Review   Micro Results No results found for this or any previous visit (from the past 240 hour(s)).  Radiology Reports Dg Chest 2 View  04/17/2014   CLINICAL DATA:  Weakness and Chest pain.  EXAM: CHEST  2 VIEW  COMPARISON:  11/21/2011.  FINDINGS: The cardiac silhouette, mediastinal and hilar contours are within normal limits and stable. The lungs demonstrate chronic emphysematous and bronchitic changes but no acute overlying pulmonary process. No pleural effusion. The bony thorax is intact. Remote healed rib fractures are noted.  IMPRESSION: Chronic bronchitic and emphysematous changes but no acute overlying pulmonary process.   Electronically Signed   By: Kalman Jewels M.D.   On: 04/17/2014 13:37    CBC  Recent Labs Lab 04/17/14 1210 04/18/14 0345  WBC 8.5 7.6  HGB 13.9 12.4  HCT 42.3 38.2  PLT 360 300  MCV 96.1 97.9  MCH 31.6 31.8  MCHC 32.9 32.5  RDW 13.6 13.8  LYMPHSABS 2.8  --   MONOABS 0.6  --   EOSABS 0.2  --   BASOSABS 0.0  --     Chemistries   Recent Labs Lab 04/17/14 1210 04/18/14 0345  NA 140 139  K 4.0 4.0  CL 101 102  CO2 27 26  GLUCOSE 82 124*  BUN 13 17  CREATININE 0.73 0.78  CALCIUM 9.2 8.9  AST 17  --   ALT 9  --   ALKPHOS 178*  --   BILITOT 0.2*  --     ------------------------------------------------------------------------------------------------------------------ estimated creatinine clearance is 39.5 mL/min (by C-G formula based on Cr of 0.78). ------------------------------------------------------------------------------------------------------------------ No results for input(s): HGBA1C in the last 72 hours. ------------------------------------------------------------------------------------------------------------------ No results for input(s): CHOL, HDL, LDLCALC, TRIG, CHOLHDL, LDLDIRECT in the last 72 hours. ------------------------------------------------------------------------------------------------------------------  Recent Labs  04/17/14 1636  TSH 1.020   ------------------------------------------------------------------------------------------------------------------ No results for input(s): VITAMINB12, FOLATE, FERRITIN, TIBC, IRON, RETICCTPCT in the last 72 hours.  Coagulation profile No results for input(s): INR, PROTIME in the last 168 hours.  No results for input(s): DDIMER in the  last 72 hours.  Cardiac Enzymes No results for input(s): CKMB, TROPONINI, MYOGLOBIN in the last 168 hours.  Invalid input(s): CK ------------------------------------------------------------------------------------------------------------------ Invalid input(s): POCBNP    Deashia Soule D.O. on 04/18/2014 at 2:02 PM  Between 7am to 7pm - Pager - [phone]  After 7pm go to [email or web address] - password [email or web address]  And look for the night coverage person covering for me after hours  [facility] Office  [phone]

## 2014-04-18 NOTE — Plan of Care (Signed)
Problem: Phase I Progression Outcomes Goal: Pain controlled with appropriate interventions Outcome: Progressing     

## 2014-04-18 NOTE — Evaluation (Signed)
Physical Therapy Evaluation Patient Details Name: Holly Valenzuela MRN: [id number] DOB: 05/05/1941 Today's Date: 04/18/2014   History of Present Illness  73 yo female admitted with weakness, FTT. Recent compression fxs-pt scheduled for potential sacroplasty. Hx of frequent falls, endometrial cancer, R THA 12/2013. Pt lives alone.  Clinical Impression  On eval, pt required Min assist for bed mobility and to stand at EOB. Deferred ambulation due to increased pain with minimal activity. Noted that pt may have a sacroplasty this admission-IR following. Will plan to follow to further assess mobility as pt is able.    Follow Up Recommendations Home health PT (depending on progress. Will continue to assess-may need to consider placement)    Equipment Recommendations  None recommended by PT    Recommendations for Other Services OT consult     Precautions / Restrictions Precautions Precautions: Fall Restrictions Weight Bearing Restrictions: No      Mobility  Bed Mobility Overal bed mobility: Needs Assistance Bed Mobility: Supine to Sit;Sit to Supine     Supine to sit: HOB elevated;Min guard Sit to supine: Min assist;HOB elevated   General bed mobility comments: Increased time. Mobility is effortful and painful.  Transfers Overall transfer level: Needs assistance   Transfers: Sit to/from Stand Sit to Stand: Min assist         General transfer comment: assist to rise, stabilize, control descent. Increased pain reported during sit to stand transitions. Pt attempted a step at EOB but activity was too painful so assisted pt back to sitting.  Ambulation/Gait             General Gait Details: Deferred-did not test at this time due to increased pain with standing.  Stairs            Wheelchair Mobility    Modified Rankin (Stroke Patients Only)       Balance                                             Pertinent Vitals/Pain Pain  Assessment: Faces Faces Pain Scale: Hurts even more Pain Location: sacral area Pain Intervention(s): Limited activity within patient's tolerance    Home Living Family/patient expects to be discharged to:: Private residence Living Arrangements: Alone   Type of Home:  (condo) Home Access: Stairs to enter Entrance Stairs-Rails: Right Entrance Stairs-Number of Steps: 2+1 Home Layout: One level Home Equipment: Toilet riser;Cane - single point;Walker - 2 wheels      Prior Function Level of Independence: Independent with assistive device(s)         Comments: used cane     Hand Dominance        Extremity/Trunk Assessment   Upper Extremity Assessment: Defer to OT evaluation           Lower Extremity Assessment: Generalized weakness      Cervical / Trunk Assessment: Normal  Communication   Communication: No difficulties  Cognition Arousal/Alertness: Awake/alert Behavior During Therapy: WFL for tasks assessed/performed Overall Cognitive Status: Within Functional Limits for tasks assessed                      General Comments      Exercises        Assessment/Plan    PT Assessment Patient needs continued PT services  PT Diagnosis Difficulty walking;Acute pain   PT Problem List Decreased strength;Decreased activity  tolerance;Decreased balance;Decreased mobility;Pain;Decreased knowledge of use of DME  PT Treatment Interventions DME instruction;Gait training;Functional mobility training;Therapeutic activities;Therapeutic exercise;Balance training;Patient/family education   PT Goals (Current goals can be found in the Care Plan section) Acute Rehab PT Goals Patient Stated Goal: less pain. regain mobility. PT Goal Formulation: With patient Time For Goal Achievement: 05/02/14 Potential to Achieve Goals: Good    Frequency Min 3X/week   Barriers to discharge        Co-evaluation               End of Session   Activity Tolerance: Patient  limited by pain Patient left: in bed;with call bell/phone within reach           Time: 0855-0912 PT Time Calculation (min) (ACUTE ONLY): 17 min   Charges:   PT Evaluation $Initial PT Evaluation Tier I: 1 Procedure PT Treatments $Therapeutic Activity: 8-22 mins   PT G Codes:           Weston Anna, MPT Pager: [phone]

## 2014-04-19 DIAGNOSIS — E43 Unspecified severe protein-calorie malnutrition: Secondary | ICD-10-CM | POA: Insufficient documentation

## 2014-04-19 DIAGNOSIS — E785 Hyperlipidemia, unspecified: Secondary | ICD-10-CM

## 2014-04-19 DIAGNOSIS — M533 Sacrococcygeal disorders, not elsewhere classified: Secondary | ICD-10-CM | POA: Insufficient documentation

## 2014-04-19 DIAGNOSIS — R000 Tachycardia, unspecified: Secondary | ICD-10-CM

## 2014-04-19 DIAGNOSIS — R627 Adult failure to thrive: Secondary | ICD-10-CM

## 2014-04-19 LAB — URINALYSIS, ROUTINE W REFLEX MICROSCOPIC
Bilirubin Urine: NEGATIVE
Glucose, UA: NEGATIVE mg/dL
Ketones, ur: NEGATIVE mg/dL
LEUKOCYTES UA: NEGATIVE
NITRITE: NEGATIVE
Protein, ur: NEGATIVE mg/dL
SPECIFIC GRAVITY, URINE: 1.008 (ref 1.005–1.030)
Urobilinogen, UA: 0.2 mg/dL (ref 0.0–1.0)
pH: 7 (ref 5.0–8.0)

## 2014-04-19 LAB — URINE MICROSCOPIC-ADD ON

## 2014-04-19 MED ORDER — ENOXAPARIN SODIUM 30 MG/0.3ML ~~LOC~~ SOLN
30.0000 mg | Freq: Once | SUBCUTANEOUS | Status: AC
  Administered 2014-04-19: 30 mg via SUBCUTANEOUS
  Filled 2014-04-19: qty 0.3

## 2014-04-19 NOTE — Progress Notes (Signed)
Clinical Social Work Department CLINICAL SOCIAL WORK PLACEMENT NOTE 04/19/2014  Patient:  Holly Valenzuela, Holly Valenzuela  Account Number:  [id number] [place] date:  04/17/2014  Clinical Social Worker:  Glorious Peach, CLINICAL SOCIAL WORKER  Date/time:  04/19/2014 11:42 AM  Clinical Social Work is seeking post-discharge placement for this patient at the following level of care:   [place]   (*CSW will update this form in Epic as items are completed)   04/19/2014  Patient/family provided with [place] Department of Clinical Social Work's list of facilities offering this level of care within the geographic area requested by the patient (or if unable, by the patient's family).  04/19/2014  Patient/family informed of their freedom to choose among providers that offer the needed level of care, that participate in Medicare, Medicaid or managed care program needed by the patient, have an available bed and are willing to accept the patient.  04/19/2014  Patient/family informed of [facility]' ownership interest in [facility], as well as of the fact that they are under no obligation to receive care at this facility.  PASARR submitted to EDS on 04/19/2014 PASARR number received on 04/19/2014  FL2 transmitted to all facilities in geographic area requested by pt/family on  04/19/2014 FL2 transmitted to all facilities within larger geographic area on   Patient informed that his/her managed care company has contracts with or will negotiate with  certain facilities, including the following:     Patient/family informed of bed offers received:  04/19/2014 Patient chooses bed at [place] Physician recommends and patient chooses bed at    Patient to be transferred to [place] on   Patient to be transferred to facility by  Patient and family notified of transfer on  Name of family member notified:    The following physician request were entered in Epic:   Additional  Comments:    [place] Intern

## 2014-04-19 NOTE — Progress Notes (Signed)
[facility]                                                                              Patient Demographics  Holly Valenzuela, is a 73 y.o. female, DOB - 29-Aug-1940, WCH:[id number]  Admit date - 04/17/2014   Admitting Physician Theodis Blaze, MD  Outpatient Primary MD for the patient is Jerlyn Ly, MD  LOS - 2   Chief Complaint  Patient presents with  . Hip Pain      History of present illness on 04/17/2014 73 year old female with history of hypertension, vitamin D deficiency, frail with history of frequent falls presented to the emergency department with concerns of several days of progressively worsening lower back pain, throbbing and constant, 7/10 severity, with some pain radiating to her lower extremities bilaterally.  No specific alleviating factors, pain made worse with weightbearing exertion. Patient has had generalized malaise as well as failure to thrive. She denied chest pain, shortness of breath, abdominal pain, numbness or tubing upon admission. In the emergency department patient was hemodynamically stable, vital signs are stable, blood work unremarkable. Hearing she was asked to admit for pain control and further evaluation. Of note patient was scheduled for sacral plasty with IR as an outpatient procedure.  Assessment & Plan   Frequent falls -likely complicated by failure to thrive -Patient was placed on telemetry however no arrhythmias noted will DC telemetry -PT and OT consulted and pending evaluation -Patient will likely need SNF as she has been unable to care for herself at home due to chronic back pain  Intractable Back pain -Patient was scheduled for sacroplasty -interventional radiology consulted and appreciated -Pending review of patient's records, IR will make decision regarding intervention during this hospital stay -Pending insurance review as well -Continue IV pain control  Severe protein calorie malnutrition/ Failure to thrive/BMI less  than 17 -Nutrition consulted and appreciated, patient has severe muscle wasting and subcutaneous fat loss in multiple regions of the body -Will start patient on Ensure complete twice a day -May consider appetite stimulant -possibly secondary patient's back pain if she is unable to care for herself at home.  Tachycardia -Resolved, Likely secondary to dehydration  Hyperlipidemia -continue statin  Vitamin D deficiency -Continue vitamin D supplementation  Tobacco abuse -Continue nicotine patch -Smoking cessation counseling given  Dysuria -Patient has had 2 UAs which were negative for infection -Will obtain a repeat UA and culture S patient has been complaining of burning pain with urination as well as odor  Code Status: Full  Family Communication: none at bedside  Disposition Plan: admitted. Spoke with IR, patient is a candidate for intervention, however, pending insurance approval.  Time Spent in minutes   30 minutes  Procedures  none  Consults   Interventional radiology  DVT Prophylaxis  Lovenox  Lab Results  Component Value Date   PLT 300 04/18/2014    Medications  Scheduled Meds: . ALPRAZolam  0.5 mg Oral QHS  . aspirin EC  325 mg Oral Daily  . atorvastatin  10 mg Oral Daily  . cholecalciferol  1,000 Units Oral Daily  . [START ON 05/12/2014] cyanocobalamin  1,000 mcg Intramuscular Q30 days  . enoxaparin (LOVENOX) injection  30 mg Subcutaneous Q24H  . feeding supplement (ENSURE COMPLETE)  237 mL Oral BID BM  . multivitamin with minerals  1 tablet Oral QPM  . nicotine  7 mg Transdermal Daily  . pantoprazole  40 mg Oral QAC breakfast  . sodium chloride  3 mL Intravenous Q12H   Continuous Infusions: . sodium chloride 75 mL/hr at 04/19/14 0358   PRN Meds:.fluticasone, morphine injection, ondansetron **OR** ondansetron (ZOFRAN) IV, oxyCODONE-acetaminophen, pseudoephedrine  Antibiotics    Anti-infectives    None        Subjective:   Holly Valenzuela seen and examined today.  Patient states her pain is manageable with medication. She does have some complaints of painful urination. She denies any chest pain, shortness of breath, abdominal pain, nausea or vomiting at this time.  Objective:   Filed Vitals:   04/18/14 1334 04/18/14 2122 04/19/14 0509 04/19/14 0522  BP: 137/70 143/76 138/85   Pulse: 67 80 65   Temp: 98.6 F (37 C) 98.1 F (36.7 C) 98.1 F (36.7 C)   TempSrc: Oral Oral Oral   Resp: 16 16 18    Height:      Weight:    39.554 kg (87 lb 3.2 oz)  SpO2: 95% 96% 97%     Wt Readings from Last 3 Encounters:  04/19/14 39.554 kg (87 lb 3.2 oz)  01/19/14 45.541 kg (100 lb 6.4 oz)  01/12/14 43.092 kg (95 lb)     Intake/Output Summary (Last 24 hours) at 04/19/14 1138 Last data filed at 04/19/14 0900  Gross per 24 hour  Intake   2280 ml  Output    700 ml  Net   1580 ml    Exam  General: Well developed, thin/frail, NAD, appears stated age  HEENT: NCAT, mucous membranes moist.   Cardiovascular: S1 S2 auscultated, no rubs, murmurs or gallops. Regular rate and rhythm.  Respiratory: Clear to auscultation bilaterally with equal chest rise  Abdomen: Soft, nontender, nondistended, + bowel sounds  Extremities: warm dry without cyanosis clubbing or edema  Neuro: AAOx3, no focal deficits  Psych: Appropriate mood and affect  Data Review   Micro Results No results found for this or any previous visit (from the past 240 hour(s)).  Radiology Reports Dg Chest 2 View  04/17/2014   CLINICAL DATA:  Weakness and Chest pain.  EXAM: CHEST  2 VIEW  COMPARISON:  11/21/2011.  FINDINGS: The cardiac silhouette, mediastinal and hilar contours are within normal limits and stable. The lungs demonstrate chronic emphysematous and bronchitic changes but no acute overlying pulmonary process. No pleural effusion. The bony thorax is intact. Remote healed rib fractures are noted.  IMPRESSION: Chronic bronchitic and emphysematous  changes but no acute overlying pulmonary process.   Electronically Signed   By: Kalman Jewels M.D.   On: 04/17/2014 13:37    CBC  Recent Labs Lab 04/17/14 1210 04/18/14 0345  WBC 8.5 7.6  HGB 13.9 12.4  HCT 42.3 38.2  PLT 360 300  MCV 96.1 97.9  MCH 31.6 31.8  MCHC 32.9 32.5  RDW 13.6 13.8  LYMPHSABS 2.8  --   MONOABS 0.6  --   EOSABS 0.2  --   BASOSABS 0.0  --     Chemistries   Recent Labs Lab 04/17/14 1210 04/18/14 0345  NA 140 139  K 4.0 4.0  CL 101 102  CO2 27 26  GLUCOSE 82 124*  BUN 13 17  CREATININE 0.73 0.78  CALCIUM 9.2 8.9  AST 17  --  ALT 9  --   ALKPHOS 178*  --   BILITOT 0.2*  --    ------------------------------------------------------------------------------------------------------------------ estimated creatinine clearance is 38.6 mL/min (by C-G formula based on Cr of 0.78). ------------------------------------------------------------------------------------------------------------------ No results for input(s): HGBA1C in the last 72 hours. ------------------------------------------------------------------------------------------------------------------ No results for input(s): CHOL, HDL, LDLCALC, TRIG, CHOLHDL, LDLDIRECT in the last 72 hours. ------------------------------------------------------------------------------------------------------------------  Recent Labs  04/17/14 1636  TSH 1.020   ------------------------------------------------------------------------------------------------------------------ No results for input(s): VITAMINB12, FOLATE, FERRITIN, TIBC, IRON, RETICCTPCT in the last 72 hours.  Coagulation profile No results for input(s): INR, PROTIME in the last 168 hours.  No results for input(s): DDIMER in the last 72 hours.  Cardiac Enzymes No results for input(s): CKMB, TROPONINI, MYOGLOBIN in the last 168 hours.  Invalid input(s):  CK ------------------------------------------------------------------------------------------------------------------ Invalid input(s): POCBNP    Tanzania Basham D.O. on 04/19/2014 at 11:38 AM  Between 7am to 7pm - Pager - [phone]  After 7pm go to [email or web address] - password [email or web address]  And look for the night coverage person covering for me after hours  [facility] Office  [phone]

## 2014-04-19 NOTE — Consult Note (Signed)
Reason for consult: sacroplasty  Referring Physician(s): TRH  History of Present Illness: Holly Valenzuela is a 73 y.o. female smoker  with history of hypertension, vitamin D deficiency, frail with history of frequent falls who presented to the [facility] emergency department with concerns of several days of progressively worsening lower back pain, throbbing and constant, 7/10 severity, with some pain radiating to her lower extremities bilaterally. No specific alleviating factors, pain made worse with weightbearing exertion. Patient has had generalized malaise as well as failure to thrive. She denied chest pain, shortness of breath, abdominal pain, numbness but has had some recent dysuria/constipation. She was originally scheduled for sacroplasty consult on 11/23 at [facility] as OP with Dr. Estanislado Pandy secondary to recent MRI from [place] revealing sacral insufficiency fractures. Request has now been received from [facility] for sacroplasty.   Past Medical History  Diagnosis Date  . Complication of anesthesia     SEVERE nausea vomiting  . Hyperlipidemia   . Headache(784.0)     HX MIGRAINES  . Arthritis   . Cancer     HX ENDOMETRIAL CANCER   . Depression     Past Surgical History  Procedure Laterality Date  . Cesarean section    . Appendectomy    . Thyroid surgery    . Abdominal hysterectomy    . Total hip arthroplasty Right 01/12/2014    Procedure: RIGHT TOTAL HIP ARTHROPLASTY ANTERIOR APPROACH;  Surgeon: Mcarthur Rossetti, MD;  Location: [facility] ORS;  Service: Orthopedics;  Laterality: Right;    Allergies: Dilaudid; Adhesive; and Codeine  Medications: Prior to Admission medications   Medication Sig Start Date End Date Taking? Authorizing Provider  ALPRAZolam Duanne Moron) 0.5 MG tablet Take 0.5 mg by mouth at bedtime.    Yes Historical Provider, MD  aspirin EC 325 MG EC tablet Take 1 tablet (325 mg total) by mouth 2 (two) times daily after a meal. Patient taking differently: Take 325 mg  by mouth daily.  01/14/14  Yes Erskine Emery, PA-C  atorvastatin (LIPITOR) 10 MG tablet Take 10 mg by mouth daily.   Yes Historical Provider, MD  cholecalciferol (VITAMIN D) 1000 UNITS tablet Take 1,000 Units by mouth daily.   Yes Historical Provider, MD  cyanocobalamin (,VITAMIN B-12,) 1000 MCG/ML injection Inject 1,000 mcg into the muscle every 30 (thirty) days.    Yes Historical Provider, MD  fluticasone (FLONASE) 50 MCG/ACT nasal spray Place 1 spray into both nostrils daily as needed for allergies or rhinitis.   Yes Historical Provider, MD  Multiple Vitamin (MULTIVITAMIN WITH MINERALS) TABS Take 1 tablet by mouth every evening.    Yes Historical Provider, MD  oxyCODONE-acetaminophen (ROXICET) 5-325 MG per tablet Take 1-2 tablets by mouth every 4 (four) hours as needed for severe pain. 01/14/14  Yes Erskine Emery, PA-C  pantoprazole (PROTONIX) 40 MG tablet Take 40 mg by mouth daily.   Yes Historical Provider, MD  pseudoephedrine (SUDAFED) 30 MG tablet Take 30 mg by mouth every 4 (four) hours as needed for congestion.   Yes Historical Provider, MD    History reviewed. No pertinent family history.  History   Social History  . Marital Status: Divorced    Spouse Name: N/A    Number of Children: N/A  . Years of Education: N/A   Social History Main Topics  . Smoking status: Current Every Day Smoker -- 0.25 packs/day for 50 years    Types: Cigarettes  . Smokeless tobacco: Never Used  . Alcohol Use: No  . Drug  Use: No  . Sexual Activity: None   Other Topics Concern  . None   Social History Narrative         Review of Systems see above  Vital Signs: BP 119/72 mmHg  Pulse 89  Temp(Src) 98 F (36.7 C) (Oral)  Resp 18  Ht 5\' 5"  (1.651 m)  Wt 87 lb 3.2 oz (39.554 kg)  BMI 14.51 kg/m2  SpO2 98%  Physical Exam pt awake/alert; very thin frame; chest- clear but distant BS bilat; heart- RRR; abd- soft,+BS,NT; ext- FROM, no edema; moderate-severe sacral point tenderness to  palpation; motor/sens fxn ok  Imaging: Dg Chest 2 View  04/17/2014   CLINICAL DATA:  Weakness and Chest pain.  EXAM: CHEST  2 VIEW  COMPARISON:  11/21/2011.  FINDINGS: The cardiac silhouette, mediastinal and hilar contours are within normal limits and stable. The lungs demonstrate chronic emphysematous and bronchitic changes but no acute overlying pulmonary process. No pleural effusion. The bony thorax is intact. Remote healed rib fractures are noted.  IMPRESSION: Chronic bronchitic and emphysematous changes but no acute overlying pulmonary process.   Electronically Signed   By: Kalman Jewels M.D.   On: 04/17/2014 13:37    Labs:  CBC:  Recent Labs  01/14/14 0532 01/15/14 0435 04/17/14 1210 04/18/14 0345  WBC 12.9* 10.6* 8.5 7.6  HGB 12.2 12.1 13.9 12.4  HCT 37.0 35.8* 42.3 38.2  PLT 183 189 360 300    COAGS:  Recent Labs  01/01/14 1450  INR 0.90  APTT 32    BMP:  Recent Labs  01/01/14 1450 01/13/14 0520 04/17/14 1210 04/18/14 0345  NA 139 135* 140 139  K 4.5 4.3 4.0 4.0  CL 100 100 101 102  CO2 28 26 27 26   GLUCOSE 83 135* 82 124*  BUN 13 9 13 17   CALCIUM 10.1 8.6 9.2 8.9  CREATININE 0.86 0.74 0.73 0.78  GFRNONAA 65* 82* 82* 80*  GFRAA 75* >90 >90 >90    LIVER FUNCTION TESTS:  Recent Labs  04/17/14 1210  BILITOT 0.2*  AST 17  ALT 9  ALKPHOS 178*  PROT 6.5  ALBUMIN 3.4*    TUMOR MARKERS: No results for input(s): AFPTM, CEA, CA199, CHROMGRNA in the last 8760 hours.  Assessment and Plan: Pt with symptomatic sacral insufficiency fractures; imaging studies have been reviewed by Dr. Estanislado Pandy and pt is candidate for sacroplasty; awaiting insurance approval; details/risks of procedure d/w pt with her understanding and consent; pt currently afebrile with nl WBC count, most recent UA with no leukocytes/neg nitrite, cx pending; once insurance approval received and if pt remains clinically stable, will tent plan case for 11/20 am at [facility]. She will return to  [facility] following procedure.          I spent a total of 20 minutes face to face in clinical consultation, greater than 50% of which was counseling/coordinating care for sacroplasty.  Signed: Autumn Messing 04/19/2014, 2:27 PM

## 2014-04-19 NOTE — Evaluation (Signed)
Occupational Therapy Evaluation Patient Details Name: Holly Valenzuela MRN: [id number] DOB: [birth year] Today's Date: 04/19/2014    History of Present Illness 73 yo female admitted with weakness, FTT. Recent compression fxs-pt scheduled for potential sacroplasty. Hx of frequent falls, endometrial cancer, R THA 12/2013. Pt lives alone.   Clinical Impression   Pt pending possibly sacroplasty. Pt currently limited by 8/10 pain with activity. Depending on if pt has procedure, pain control, and functional progress, pt may benefit from SNF (she lives alone) versus home if able to progress.     Follow Up Recommendations  SNF;Supervision/Assistance - 24 hour    Equipment Recommendations  None recommended by OT    Recommendations for Other Services       Precautions / Restrictions Precautions Precautions: Fall Restrictions Weight Bearing Restrictions: No      Mobility Bed Mobility Overal bed mobility: Needs Assistance       Supine to sit: HOB elevated;Supervision        Transfers Overall transfer level: Needs assistance   Transfers: Sit to/from Stand Sit to Stand: Min guard         General transfer comment: close guard for safety    Balance                                            ADL Overall ADL's : Needs assistance/impaired Eating/Feeding: Independent;Sitting   Grooming: Wash/dry hands;Set up;Sitting   Upper Body Bathing: Set up;Sitting   Lower Body Bathing: Minimal assistance;Sit to/from stand   Upper Body Dressing : Set up;Sitting   Lower Body Dressing: Minimal assistance;Sit to/from stand   Toilet Transfer: Minimal assistance;Stand-pivot;BSC   Toileting- Clothing Manipulation and Hygiene: Minimal assistance;Sit to/from stand         General ADL Comments: Pt states she struggled at home PTA due to back pain. She reports all tasks were difficult at home. Obtained gripper socks for pt as she didnt have a pair to pivot to  [facility]. Also gave me a comb and lotion and she was very pleased. Pt able to reach down and touch toes in bed but reports it does "pull" on her lower back. MD in during session.      Vision                     Perception     Praxis      Pertinent Vitals/Pain Pain Assessment: 0-10 Pain Score: 8  Pain Location: sacral area Pain Descriptors / Indicators: Other (Comment) (pulling) Pain Intervention(s): Monitored during session;Repositioned     Hand Dominance     Extremity/Trunk Assessment Upper Extremity Assessment Upper Extremity Assessment: Generalized weakness           Communication Communication Communication: No difficulties   Cognition Arousal/Alertness: Awake/alert Behavior During Therapy: WFL for tasks assessed/performed Overall Cognitive Status: Within Functional Limits for tasks assessed                     General Comments       Exercises       Shoulder Instructions      Home Living Family/patient expects to be discharged to:: Private residence Living Arrangements: Alone   Type of Home:  (condo) Home Access: Stairs to enter [employer] of Steps: 2+1 Entrance Stairs-Rails: Right Home Layout: One level     Bathroom Shower/Tub: Gaffer  Bathroom Toilet: Standard     Home Equipment: Toilet riser;Cane - single point;Walker - 2 wheels;Grab bars - tub/shower          Prior Functioning/Environment Level of Independence: Independent with assistive device(s)        Comments: used cane. Pt reports not eating much PTA due to not be able to tolerate standing with the back pain.    OT Diagnosis: Generalized weakness;Acute pain   OT Problem List: Decreased strength;Decreased knowledge of use of DME or AE;Pain   OT Treatment/Interventions: Self-care/ADL training;Patient/family education;Therapeutic activities;DME and/or AE instruction    OT Goals(Current goals can be found in the care plan section) Acute Rehab OT  Goals Patient Stated Goal: less pain. regain mobility. OT Goal Formulation: With patient Time For Goal Achievement: 05/03/14 Potential to Achieve Goals: Good  OT Frequency: Min 2X/week   Barriers to D/C:            Co-evaluation              End of Session    Activity Tolerance: Patient limited by pain Patient left: in bed;with call bell/phone within reach   Time: 1405-1430 OT Time Calculation (min): 25 min Charges:  OT General Charges $OT Visit: 1 Procedure OT Evaluation $Initial OT Evaluation Tier I: 1 Procedure OT Treatments $Therapeutic Activity: 8-22 mins G-Codes:    Jules Schick  [phone] 04/19/2014, 2:47 PM

## 2014-04-19 NOTE — Progress Notes (Signed)
Clinical Social Work Department BRIEF PSYCHOSOCIAL ASSESSMENT 04/19/2014  Patient:  Holly Valenzuela, RATLIFF     Account Number:  [id number]     Admit date:  04/17/2014  Clinical Social Worker:  Glorious Peach, CLINICAL SOCIAL WORKER  Date/Time:  04/19/2014 11:16 AM  Referred by:  Physician  Date Referred:  04/19/2014 Referred for  SNF Placement   Other Referral:   Interview type:  Patient Other interview type:   and patient's daughter via phone    PSYCHOSOCIAL DATA Living Status:  ALONE Admitted from facility:   Level of care:   Primary support name:  Margarita Rana Primary support relationship to patient:  CHILD, ADULT Degree of support available:   Adequate    CURRENT CONCERNS Current Concerns  Post-Acute Placement   Other Concerns:    SOCIAL WORK ASSESSMENT / PLAN CSW received consult from PT who is recommending patient go to SNF for short term rehab or home with home health.   Assessment/plan status:   Other assessment/ plan:   Information/referral to community resources:    PATIENT'S/FAMILY'S RESPONSE TO PLAN OF CARE: CSW spoke with patient at bedside introduced self and explained role. CSW informed patient about PT recommendation and explained the benefits of that rehab. Patient expressed her concern about back fractures and is afraid that short term rehab would not be beneficial at the time. Patient wants to discuss options with daughter before making a decision. CSW understandable to patient's request, and left patient with a list of [facility]. Patient ensured CSW that she would go over list with daughter. Patient wishes to know more about back fracture plans per MD. Patient would be agreeable to go to [facility] if back fractures heal. Daughter, [name], contacted via phone and aware of PT recommendations as well and plans to look over their options before making further decisions. CSW will continue to follow patient.       Glorious Peach BSW  Intern

## 2014-04-20 DIAGNOSIS — R531 Weakness: Secondary | ICD-10-CM | POA: Insufficient documentation

## 2014-04-20 DIAGNOSIS — R52 Pain, unspecified: Secondary | ICD-10-CM | POA: Insufficient documentation

## 2014-04-20 LAB — BASIC METABOLIC PANEL
ANION GAP: 9 (ref 5–15)
BUN: 15 mg/dL (ref 6–23)
CO2: 29 meq/L (ref 19–32)
Calcium: 8.8 mg/dL (ref 8.4–10.5)
Chloride: 101 mEq/L (ref 96–112)
Creatinine, Ser: 0.76 mg/dL (ref 0.50–1.10)
GFR calc Af Amer: >90 mL/min (ref >90)
GFR calc non Af Amer: 81 mL/min — ABNORMAL LOW (ref >90)
Glucose, Bld: 86 mg/dL (ref 70–99)
POTASSIUM: 4.5 meq/L (ref 3.7–5.3)
SODIUM: 139 meq/L (ref 137–147)

## 2014-04-20 LAB — CBC
HEMATOCRIT: 38.8 % (ref 36.0–46.0)
HEMOGLOBIN: 12.7 g/dL (ref 12.0–15.0)
MCH: 31.5 pg (ref 26.0–34.0)
MCHC: 32.7 g/dL (ref 30.0–36.0)
MCV: 96.3 fL (ref 78.0–100.0)
PLATELETS: 317 10*3/uL (ref 150–400)
RBC: 4.03 MIL/uL (ref 3.87–5.11)
RDW: 13.8 % (ref 11.5–15.5)
WBC: 6.4 10*3/uL (ref 4.0–10.5)

## 2014-04-20 LAB — APTT: APTT: 34 s (ref 24–37)

## 2014-04-20 LAB — PROTIME-INR
INR: 0.98 (ref 0.00–1.49)
Prothrombin Time: 13.1 seconds (ref 11.6–15.2)

## 2014-04-20 MED ORDER — OXYCODONE-ACETAMINOPHEN 5-325 MG PO TABS: 1.0000 | ORAL_TABLET | ORAL | Status: DC | PRN

## 2014-04-20 MED ORDER — NICOTINE 7 MG/24HR TD PT24: 7.0000 mg | MEDICATED_PATCH | Freq: Every day | TRANSDERMAL | Status: DC

## 2014-04-20 MED ORDER — ENSURE COMPLETE PO LIQD: 237.0000 mL | Freq: Two times a day (BID) | ORAL | Status: DC

## 2014-04-20 MED ORDER — ALPRAZOLAM 0.5 MG PO TABS: 0.5000 mg | ORAL_TABLET | Freq: Every day | ORAL | Status: DC

## 2014-04-20 NOTE — Progress Notes (Signed)
Physical Therapy Treatment Patient Details Name: Holly Valenzuela MRN: [id number] DOB: 1940/11/14 Today's Date: 04/20/2014    History of Present Illness 73 yo female admitted with weakness, FTT. Recent compression fxs-pt scheduled for potential sacroplasty. Hx of frequent falls, endometrial cancer, R THA 12/2013. Pt lives alone.    PT Comments    Progressing with mobility. Pain under better control per pt. Plan is to d/c to [facility] and have procedure as outpatient.   Follow Up Recommendations  SNF     Equipment Recommendations  None recommended by PT    Recommendations for Other Services       Precautions / Restrictions Precautions Precautions: Fall Restrictions Weight Bearing Restrictions: No    Mobility  Bed Mobility Overal bed mobility: Needs Assistance Bed Mobility: Supine to Sit;Sit to Supine     Supine to sit: Supervision;HOB elevated Sit to supine: Supervision;HOB elevated      Transfers Overall transfer level: Needs assistance Equipment used: Rolling walker (2 wheeled)   Sit to Stand: Min guard         General transfer comment: close guard for safety  Ambulation/Gait Ambulation/Gait assistance: Min guard Ambulation Distance (Feet): 15 Feet Assistive device: Rolling walker (2 wheeled) Gait Pattern/deviations: Step-through pattern;Decreased stride length     General Gait Details: slow but steady. uses rw for stability. pt tolerated well.    Stairs            Wheelchair Mobility    Modified Rankin (Stroke Patients Only)       Balance                                    Cognition Arousal/Alertness: Awake/alert Behavior During Therapy: WFL for tasks assessed/performed Overall Cognitive Status: Within Functional Limits for tasks assessed                      Exercises      General Comments        Pertinent Vitals/Pain Pain Assessment: Faces Pain Score: 2  Pain Location: sacral area Pain  Intervention(s): Monitored during session    Home Living                      Prior Function            PT Goals (current goals can now be found in the care plan section) Progress towards PT goals: Progressing toward goals    Frequency  Min 3X/week    PT Plan Current plan remains appropriate    Co-evaluation             End of Session   Activity Tolerance: Patient tolerated treatment well Patient left: in bed;with call bell/phone within reach     Time: 0955-1005 PT Time Calculation (min) (ACUTE ONLY): 10 min  Charges:  $Gait Training: 8-22 mins                    G Codes:      Weston Anna, MPT Pager: [phone]

## 2014-04-20 NOTE — Progress Notes (Addendum)
Occupational Therapy Treatment Patient Details Name: Holly Valenzuela MRN: [id number] DOB: 1941-04-10 Today's Date: 04/20/2014    History of present illness 73 yo female admitted with weakness, FTT. Recent compression fxs-pt scheduled for potential sacroplasty. Hx of frequent falls, endometrial cancer, R THA 12/2013. Pt lives alone.   OT comments  Recommended SNF but note from chart that pt is d/c to home and not SNF (SNF wont accept pt). Pt states she will have around the clock caregivers and wont be alone. Procedure to be performed as outpatient per chart and pt. Feel pt ok to d/c home as long as she has the 24/7 assist for safety.    Follow Up Recommendations  SNF;Supervision/Assistance - 24 hour    Equipment Recommendations  None recommended by OT    Recommendations for Other Services      Precautions / Restrictions Precautions Precautions: Fall Restrictions Weight Bearing Restrictions: No       Mobility Bed Mobility Overal bed mobility: Needs Assistance Bed Mobility: Sit to Supine;Supine to Sit     Supine to sit: Supervision;HOB elevated Sit to supine: Supervision;HOB elevated      Transfers Overall transfer level: Needs assistance Equipment used: Rolling walker (2 wheeled) Transfers: Sit to/from Stand Sit to Stand: Supervision              Balance                                   ADL       Grooming: Wash/dry hands;Supervision/safety;Standing                   Toilet Transfer: Min guard;Ambulation;Comfort height toilet;Grab bars   Toileting- Clothing Manipulation and Hygiene: Sit to/from stand;Supervision/safety         General ADL Comments: Pt stating she is d/c to home and will have around the clock caregivers. Per chart procedure to be scheduled as outpatient and [facility] cant accept pt. Discussed safety for home including to sit down on shower seat for showering and having caregiver right there with her. She  declines need to practice in and out of shower. Also discussed that she has a riser with grab bars at toilet. Reinforced use of walker for safety as pt tending to push it aside to step over to the sink. Pt reports 6/10 pain and reports having meds but did well with being up with OT.       Vision                     Perception     Praxis      Cognition   Behavior During Therapy: WFL for tasks assessed/performed Overall Cognitive Status: Within Functional Limits for tasks assessed                       Extremity/Trunk Assessment               Exercises     Shoulder Instructions       General Comments      Pertinent Vitals/ Pain       Pain Assessment: 0-10 Pain Score: 6  Pain Location: sacral area Pain Intervention(s): Repositioned  Home Living  Prior Functioning/Environment              Frequency Min 2X/week     Progress Toward Goals  OT Goals(current goals can now be found in the care plan section)  Progress towards OT goals: Progressing toward goals     Plan Discharge plan remains appropriate    Co-evaluation                 End of Session Equipment Utilized During Treatment: Rolling walker   Activity Tolerance Patient tolerated treatment well   Patient Left in bed;with call bell/phone within reach   Nurse Communication          Time: 1415-1430 OT Time Calculation (min): 15 min  Charges: OT General Charges $OT Visit: 1 Procedure OT Treatments $Therapeutic Activity: 8-22 mins  Jules Schick  [phone] 04/20/2014, 2:46 PM

## 2014-04-20 NOTE — Progress Notes (Signed)
Patient ID: Holly Valenzuela, female   DOB: [birth year], 73 y.o.   MRN: [id number]   Sacroplasty has not ben approved with Insurance yet today. Insurance says it will be possibly Monday before we hear back  I have informed RN we will NOT move forward today. Resume diet; etc....  IR PA will discuss with pt.  Could be performed as OP(after Ins approval) if MD feels appropriate.

## 2014-04-20 NOTE — Plan of Care (Signed)
Problem: Phase I Progression Outcomes Goal: OOB as tolerated unless otherwise ordered Outcome: Completed/Met Date Met:  04/20/14     

## 2014-04-20 NOTE — Discharge Summary (Addendum)
Physician Discharge Summary  Holly Valenzuela VFI:[id number] DOB: 12/12/1940 DOA: 04/17/2014  PCP: Jerlyn Ly, MD  Admit date: 04/17/2014 Discharge date: 04/20/2014  Time spent: 45 minutes  Recommendations for Outpatient Follow-up:  Patient will be discharged to home with homehealth.  Patient will need to follow-up with her primary care within 1 week of discharge. Patient will also need to follow-up with interventional radiology for outpatient procedure. Patient may resume heart healthy diet and activity as tolerated. Patient should continue to take her medications as prescribed.  Discharge Diagnoses:  Frequent falls Intractable back pain Severe protein calorie malnutrition/failure to thrive/BMI of less than 17 Tachycardia Hyperlipidemia Vitamin D deficiency Tobacco abuse Dysuria  Discharge Condition: Stable  Diet recommendation: Heart healthy  Filed Weights   04/18/14 0423 04/19/14 0522 04/20/14 0526  Weight: 40.597 kg (89 lb 8 oz) 39.554 kg (87 lb 3.2 oz) 41.051 kg (90 lb 8 oz)    History of present illness:  on 04/17/2014 73 year old female with history of hypertension, vitamin D deficiency, frail with history of frequent falls presented to the emergency department with concerns of several days of progressively worsening lower back pain, throbbing and constant, 7/10 severity, with some pain radiating to her lower extremities bilaterally. No specific alleviating factors, pain made worse with weightbearing exertion. Patient has had generalized malaise as well as failure to thrive. She denied chest pain, shortness of breath, abdominal pain, numbness or tubing upon admission. In the emergency department patient was hemodynamically stable, vital signs are stable, blood work unremarkable. Hearing she was asked to admit for pain control and further evaluation. Of note patient was scheduled for sacral plasty with IR as an outpatient procedure.  Hospital Course:  Frequent  falls -likely complicated by failure to thrive -Patient was placed on telemetry however no arrhythmias noted will DC telemetry -PT and OT consulted and recommended skilled nursing facility  Intractable Back pain -Patient was scheduled for sacroplasty -interventional radiology consulted and appreciated -At this time, pending insurance review as well -Patient will be discharged to skilled nursing facility and may have this done as an outpatient. -Continue pain control  Severe protein calorie malnutrition/ Failure to thrive/BMI less than 17 -Nutrition consulted and appreciated, patient has severe muscle wasting and subcutaneous fat loss in multiple regions of the body -Continue Ensure -May consider appetite stimulant, patient should discuss with her primary care physician -possibly secondary patient's back pain if she is unable to care for herself at home.  Tachycardia -Resolved, Likely secondary to dehydration  Hyperlipidemia -continue statin  Vitamin D deficiency -Continue vitamin D supplementation  Tobacco abuse -Continue nicotine patch -Smoking cessation counseling given  Dysuria -Patient has had 2 UAs which were negative for infection -Repeat urine analysis was negative for infection -Patient no longer complains of dysuria however stated that she had a urinary tract infection before admission. She was treated with antibiotics at that time  Procedures: None  Consultations: Interventional radiology  Discharge Exam: Filed Vitals:   04/20/14 0526  BP: 129/64  Pulse: 68  Temp: 97.8 F (36.6 C)  Resp: 20     General: Well developed, well nourished, NAD, appears stated age  HEENT: NCAT, mucous membranes moist.  Cardiovascular: S1 S2 auscultated, no rubs, murmurs or gallops. Regular rate and rhythm.  Respiratory: Clear to auscultation bilaterally with equal chest rise  Abdomen: Soft, nontender, nondistended, + bowel sounds  Extremities: warm dry without  cyanosis clubbing or edema  Neuro: AAOx3, no focal deficits  Psych: Pleasant, appropriate mood and affect  Discharge Instructions      Discharge Instructions    Discharge instructions    Complete by:  As directed   Patient will be discharged to [facility].   She is to continue physical and occupational therapy as recommended by the facility. Patient will need to follow-up with her primary care within 1 week of discharge. Patient will also need to follow-up with interventional radiology for outpatient procedure. Patient may resume heart healthy diet and activity as tolerated. Patient should continue to take her medications as prescribed.            Medication List    TAKE these medications        ALPRAZolam 0.5 MG tablet  Commonly known as:  XANAX  Take 1 tablet (0.5 mg total) by mouth at bedtime.     aspirin 325 MG EC tablet  Take 1 tablet (325 mg total) by mouth 2 (two) times daily after a meal.     atorvastatin 10 MG tablet  Commonly known as:  LIPITOR  Take 10 mg by mouth daily.     cholecalciferol 1000 UNITS tablet  Commonly known as:  VITAMIN D  Take 1,000 Units by mouth daily.     cyanocobalamin 1000 MCG/ML injection  Commonly known as:  (VITAMIN B-12)  Inject 1,000 mcg into the muscle every 30 (thirty) days.     feeding supplement (ENSURE COMPLETE) Liqd  Take 237 mLs by mouth 2 (two) times daily between meals.     fluticasone 50 MCG/ACT nasal spray  Commonly known as:  FLONASE  Place 1 spray into both nostrils daily as needed for allergies or rhinitis.     multivitamin with minerals Tabs tablet  Take 1 tablet by mouth every evening.     nicotine 7 mg/24hr patch  Commonly known as:  NICODERM CQ - dosed in mg/24 hr  Place 1 patch (7 mg total) onto the skin daily.     oxyCODONE-acetaminophen 5-325 MG per tablet  Commonly known as:  ROXICET  Take 1-2 tablets by mouth every 4 (four) hours as needed for severe pain.     pantoprazole 40 MG tablet    Commonly known as:  PROTONIX  Take 40 mg by mouth daily.     pseudoephedrine 30 MG tablet  Commonly known as:  SUDAFED  Take 30 mg by mouth every 4 (four) hours as needed for congestion.       Allergies  Allergen Reactions  . Dilaudid [Hydromorphone Hcl] Nausea And Vomiting  . Adhesive [Tape] Itching  . Codeine Nausea And Vomiting   Follow-up Information    Follow up with PERINI,MARK A, MD. Schedule an appointment as soon as possible for a visit in 1 week.   Specialty:  Internal Medicine   Why:  Hospital followup   Contact information:   [address] [place] [address] [phone]       Follow up with Rob Hickman, MD.   Specialty:  Interventional Radiology   Why:  Call [phone] to schedule   Contact information:   [address] [place] [address] [phone]        The results of significant diagnostics from this hospitalization (including imaging, microbiology, ancillary and laboratory) are listed below for reference.    Significant Diagnostic Studies: Dg Chest 2 View  04/17/2014   CLINICAL DATA:  Weakness and Chest pain.  EXAM: CHEST  2 VIEW  COMPARISON:  11/21/2011.  FINDINGS: The cardiac silhouette, mediastinal and hilar contours are  within normal limits and stable. The lungs demonstrate chronic emphysematous and bronchitic changes but no acute overlying pulmonary process. No pleural effusion. The bony thorax is intact. Remote healed rib fractures are noted.  IMPRESSION: Chronic bronchitic and emphysematous changes but no acute overlying pulmonary process.   Electronically Signed   By: Kalman Jewels M.D.   On: 04/17/2014 13:37    Microbiology: No results found for this or any previous visit (from the past 240 hour(s)).   Labs: Basic Metabolic Panel:  Recent Labs Lab 04/17/14 1210 04/18/14 0345 04/20/14 0451  NA 140 139 139  K 4.0 4.0 4.5  CL 101 102 101  CO2 27 26 29   GLUCOSE 82 124* 86  BUN 13 17 15   CREATININE  0.73 0.78 0.76  CALCIUM 9.2 8.9 8.8   Liver Function Tests:  Recent Labs Lab 04/17/14 1210  AST 17  ALT 9  ALKPHOS 178*  BILITOT 0.2*  PROT 6.5  ALBUMIN 3.4*   No results for input(s): LIPASE, AMYLASE in the last 168 hours. No results for input(s): AMMONIA in the last 168 hours. CBC:  Recent Labs Lab 04/17/14 1210 04/18/14 0345 04/20/14 0451  WBC 8.5 7.6 6.4  NEUTROABS 5.0  --   --   HGB 13.9 12.4 12.7  HCT 42.3 38.2 38.8  MCV 96.1 97.9 96.3  PLT 360 300 317   Cardiac Enzymes: No results for input(s): CKTOTAL, CKMB, CKMBINDEX, TROPONINI in the last 168 hours. BNP: BNP (last 3 results) No results for input(s): PROBNP in the last 8760 hours. CBG: No results for input(s): GLUCAP in the last 168 hours.     SignedCristal Ford  [facility] 04/20/2014, 2:13 PM

## 2014-04-20 NOTE — Plan of Care (Signed)
Problem: Acute Rehab OT Goals (only OT should resolve) Goal: Pt. Will Perform Toileting-Clothing Manipulation Outcome: Completed/Met Date Met:  04/20/14

## 2014-04-20 NOTE — Progress Notes (Signed)
CARE MANAGEMENT NOTE 04/20/2014  Patient:  Holly Valenzuela, Holly Valenzuela   Account Number:  [id number]  Date Initiated:  04/19/2014  Documentation initiated by:  Karl Bales  Subjective/Objective Assessment:   73 yo pt admitted with fall, FTT,     Action/Plan:   from home alone   Anticipated DC Date:  04/21/2014   Anticipated DC Plan:  SKILLED NURSING FACILITY  In-house referral  Clinical Social Worker         Choice offered to / List presented to:             Status of service:  In process, will continue to follow Medicare Important Message given?  YES (If response is "NO", the following Medicare IM given date fields will be blank) Date Medicare IM given:  04/20/2014 Medicare IM given by:  Karl Bales Date Additional Medicare IM given:   Additional Medicare IM given by:    Discharge Disposition:    Per UR Regulation:  Reviewed for med. necessity/level of care/duration of stay  If discussed at [place] of Stay Meetings, dates discussed:    Comments:  04/20/14 MMcGibboney, RN, BSN Pt selectecd to go home related to [facility] not paying for SNF. Pt selected [facility]. Spoke with Otila Kluver at [employer] [phone] office, fax [phone]. Orders plus pt's insurance information faxed. [facility] will prepare pt's food if there is food in the home.  04/17/14 MMcGibboney, RN, BSN Plan to for surgery.

## 2014-04-20 NOTE — Discharge Instructions (Signed)
Back Pain, Adult Low back pain is very common. About 1 in 5 people have back pain.The cause of low back pain is rarely dangerous. The pain often gets better over time.About half of people with a sudden onset of back pain feel better in just 2 weeks. About 8 in 10 people feel better by 6 weeks.  CAUSES Some common causes of back pain include:  Strain of the muscles or ligaments supporting the spine.  Wear and tear (degeneration) of the spinal discs.  Arthritis.  Direct injury to the back. DIAGNOSIS Most of the time, the direct cause of low back pain is not known.However, back pain can be treated effectively even when the exact cause of the pain is unknown.Answering your caregiver's questions about your overall health and symptoms is one of the most accurate ways to make sure the cause of your pain is not dangerous. If your caregiver needs more information, he or she may order lab work or imaging tests (X-rays or MRIs).However, even if imaging tests show changes in your back, this usually does not require surgery. HOME CARE INSTRUCTIONS For many people, back pain returns.Since low back pain is rarely dangerous, it is often a condition that people can learn to manageon their own.   Remain active. It is stressful on the back to sit or stand in one place. Do not sit, drive, or stand in one place for more than 30 minutes at a time. Take short walks on level surfaces as soon as pain allows.Try to increase the length of time you walk each day.  Do not stay in bed.Resting more than 1 or 2 days can delay your recovery.  Do not avoid exercise or work.Your body is made to move.It is not dangerous to be active, even though your back may hurt.Your back will likely heal faster if you return to being active before your pain is gone.  Pay attention to your body when you bend and lift. Many people have less discomfortwhen lifting if they bend their knees, keep the load close to their bodies,and  avoid twisting. Often, the most comfortable positions are those that put less stress on your recovering back.  Find a comfortable position to sleep. Use a firm mattress and lie on your side with your knees slightly bent. If you lie on your back, put a pillow under your knees.  Only take over-the-counter or prescription medicines as directed by your caregiver. Over-the-counter medicines to reduce pain and inflammation are often the most helpful.Your caregiver may prescribe muscle relaxant drugs.These medicines help dull your pain so you can more quickly return to your normal activities and healthy exercise.  Put ice on the injured area.  Put ice in a plastic bag.  Place a towel between your skin and the bag.  Leave the ice on for 15-20 minutes, 03-04 times a day for the first 2 to 3 days. After that, ice and heat may be alternated to reduce pain and spasms.  Ask your caregiver about trying back exercises and gentle massage. This may be of some benefit.  Avoid feeling anxious or stressed.Stress increases muscle tension and can worsen back pain.It is important to recognize when you are anxious or stressed and learn ways to manage it.Exercise is a great option. SEEK MEDICAL CARE IF:  You have pain that is not relieved with rest or medicine.  You have pain that does not improve in 1 week.  You have new symptoms.  You are generally not feeling well. SEEK   IMMEDIATE MEDICAL CARE IF:   You have pain that radiates from your back into your legs.  You develop new bowel or bladder control problems.  You have unusual weakness or numbness in your arms or legs.  You develop nausea or vomiting.  You develop abdominal pain.  You feel faint. Document Released: 05/18/2005 Document Revised: 11/17/2011 Document Reviewed: 09/19/2013 ExitCare Patient Information 2015 [employer]. This information is not intended to replace advice given to you by your health care provider. Make sure you  discuss any questions you have with your health care provider.  

## 2014-04-20 NOTE — Progress Notes (Signed)
CSW spoke with [facility] who states that because patient will be getting procedure as outpatient, they are unable to accept patient. CSW made patient aware & she states that she will just go home. RNCM, Cookie aware.   Raynaldo Opitz, [facility] Clinical Social Worker cell #: [phone]

## 2014-04-22 LAB — URINE CULTURE

## 2014-04-23 ENCOUNTER — Ambulatory Visit (HOSPITAL_COMMUNITY): Payer: No Typology Code available for payment source

## 2014-04-25 ENCOUNTER — Other Ambulatory Visit (HOSPITAL_COMMUNITY): Payer: Self-pay | Admitting: Interventional Radiology

## 2014-04-25 ENCOUNTER — Other Ambulatory Visit: Payer: Self-pay | Admitting: Radiology

## 2014-04-25 DIAGNOSIS — M533 Sacrococcygeal disorders, not elsewhere classified: Secondary | ICD-10-CM

## 2014-04-25 DIAGNOSIS — S3210XA Unspecified fracture of sacrum, initial encounter for closed fracture: Secondary | ICD-10-CM

## 2014-04-27 ENCOUNTER — Encounter (HOSPITAL_COMMUNITY): Payer: Self-pay

## 2014-04-27 ENCOUNTER — Ambulatory Visit (HOSPITAL_COMMUNITY)
Admission: RE | Admit: 2014-04-27 | Discharge: 2014-04-27 | Disposition: A | Discharge status: Home / Self Care | Payer: Medicare Other | Source: Ambulatory Visit | Attending: Interventional Radiology | Admitting: Interventional Radiology

## 2014-04-27 DIAGNOSIS — S3210XA Unspecified fracture of sacrum, initial encounter for closed fracture: Secondary | ICD-10-CM

## 2014-04-27 DIAGNOSIS — Z79899 Other long term (current) drug therapy: Secondary | ICD-10-CM | POA: Diagnosis not present

## 2014-04-27 DIAGNOSIS — M545 Low back pain: Secondary | ICD-10-CM | POA: Diagnosis present

## 2014-04-27 DIAGNOSIS — M8448XA Pathological fracture, other site, initial encounter for fracture: Secondary | ICD-10-CM | POA: Diagnosis not present

## 2014-04-27 DIAGNOSIS — Z7982 Long term (current) use of aspirin: Secondary | ICD-10-CM | POA: Diagnosis not present

## 2014-04-27 DIAGNOSIS — Z79891 Long term (current) use of opiate analgesic: Secondary | ICD-10-CM | POA: Insufficient documentation

## 2014-04-27 DIAGNOSIS — F17210 Nicotine dependence, cigarettes, uncomplicated: Secondary | ICD-10-CM | POA: Insufficient documentation

## 2014-04-27 DIAGNOSIS — M533 Sacrococcygeal disorders, not elsewhere classified: Secondary | ICD-10-CM

## 2014-04-27 DIAGNOSIS — Z7951 Long term (current) use of inhaled steroids: Secondary | ICD-10-CM | POA: Insufficient documentation

## 2014-04-27 LAB — BASIC METABOLIC PANEL
ANION GAP: 14 (ref 5–15)
BUN: 12 mg/dL (ref 6–23)
CO2: 26 mEq/L (ref 19–32)
Calcium: 9.3 mg/dL (ref 8.4–10.5)
Chloride: 99 mEq/L (ref 96–112)
Creatinine, Ser: 0.77 mg/dL (ref 0.50–1.10)
GFR, EST NON AFRICAN AMERICAN: 81 mL/min — AB (ref >90)
Glucose, Bld: 96 mg/dL (ref 70–99)
Potassium: 4.7 mEq/L (ref 3.7–5.3)
SODIUM: 139 meq/L (ref 137–147)

## 2014-04-27 LAB — CBC WITH DIFFERENTIAL/PLATELET
BASOS ABS: 0 10*3/uL (ref 0.0–0.1)
Basophils Relative: 0 % (ref 0–1)
Eosinophils Absolute: 0.3 10*3/uL (ref 0.0–0.7)
Eosinophils Relative: 4 % (ref 0–5)
HEMATOCRIT: 44.1 % (ref 36.0–46.0)
Hemoglobin: 14.2 g/dL (ref 12.0–15.0)
LYMPHS PCT: 30 % (ref 12–46)
Lymphs Abs: 2.1 10*3/uL (ref 0.7–4.0)
MCH: 30.7 pg (ref 26.0–34.0)
MCHC: 32.2 g/dL (ref 30.0–36.0)
MCV: 95.5 fL (ref 78.0–100.0)
Monocytes Absolute: 0.7 10*3/uL (ref 0.1–1.0)
Monocytes Relative: 11 % (ref 3–12)
NEUTROS ABS: 3.9 10*3/uL (ref 1.7–7.7)
Neutrophils Relative %: 55 % (ref 43–77)
PLATELETS: 360 10*3/uL (ref 150–400)
RBC: 4.62 MIL/uL (ref 3.87–5.11)
RDW: 14.1 % (ref 11.5–15.5)
WBC: 6.9 10*3/uL (ref 4.0–10.5)

## 2014-04-27 LAB — PROTIME-INR
INR: 0.95 (ref 0.00–1.49)
Prothrombin Time: 12.8 seconds (ref 11.6–15.2)

## 2014-04-27 LAB — APTT: aPTT: 30 seconds (ref 24–37)

## 2014-04-27 MED ORDER — ONDANSETRON HCL 4 MG/2ML IJ SOLN: 4.0000 mg | Freq: Once | INTRAMUSCULAR | Status: DC

## 2014-04-27 MED ORDER — OXYCODONE-ACETAMINOPHEN 5-325 MG PO TABS
ORAL_TABLET | ORAL | Status: AC
  Administered 2014-04-27: 1 via ORAL
  Filled 2014-04-27: qty 1

## 2014-04-27 MED ORDER — FENTANYL CITRATE 0.05 MG/ML IJ SOLN
INTRAMUSCULAR | Status: AC | PRN
  Administered 2014-04-27 (×2): 25 ug via INTRAVENOUS
  Administered 2014-04-27: 12.5 ug via INTRAVENOUS
  Administered 2014-04-27: 25 ug via INTRAVENOUS

## 2014-04-27 MED ORDER — MIDAZOLAM HCL 5 MG/5ML IJ SOLN
INTRAMUSCULAR | Status: AC | PRN
  Administered 2014-04-27: 1 mg via INTRAVENOUS

## 2014-04-27 MED ORDER — MIDAZOLAM HCL 2 MG/2ML IJ SOLN
INTRAMUSCULAR | Status: AC | PRN
  Administered 2014-04-27: 1 mg via INTRAVENOUS
  Administered 2014-04-27: 0.5 mg via INTRAVENOUS

## 2014-04-27 MED ORDER — SODIUM CHLORIDE 0.9 % IV SOLN
INTRAVENOUS | Status: AC
  Administered 2014-04-27: 11:00:00 via INTRAVENOUS

## 2014-04-27 MED ORDER — IOHEXOL 300 MG/ML  SOLN
50.0000 mL | Freq: Once | INTRAMUSCULAR | Status: AC | PRN
  Administered 2014-04-27: 3 mL via INTRAVENOUS

## 2014-04-27 MED ORDER — OXYCODONE-ACETAMINOPHEN 5-325 MG PO TABS
1.0000 | ORAL_TABLET | Freq: Once | ORAL | Status: AC
  Administered 2014-04-27: 1 via ORAL

## 2014-04-27 MED ORDER — TOBRAMYCIN SULFATE 1.2 G IJ SOLR
INTRAMUSCULAR | Status: AC
Start: 2014-04-27 — End: 2014-04-27
  Filled 2014-04-27: qty 1.2

## 2014-04-27 MED ORDER — BUTALBITAL-APAP-CAFFEINE 50-325-40 MG PO TABS
1.0000 | ORAL_TABLET | Freq: Once | ORAL | Status: AC
  Administered 2014-04-27: 1 via ORAL
  Filled 2014-04-27: qty 1

## 2014-04-27 MED ORDER — CEFAZOLIN SODIUM-DEXTROSE 2-3 GM-% IV SOLR
INTRAVENOUS | Status: AC
  Filled 2014-04-27: qty 50

## 2014-04-27 MED ORDER — CEFAZOLIN SODIUM-DEXTROSE 2-3 GM-% IV SOLR
2.0000 g | Freq: Once | INTRAVENOUS | Status: AC
  Administered 2014-04-27: 2 g via INTRAVENOUS

## 2014-04-27 MED ORDER — FENTANYL CITRATE 0.05 MG/ML IJ SOLN
INTRAMUSCULAR | Status: AC
  Filled 2014-04-27: qty 4

## 2014-04-27 MED ORDER — MIDAZOLAM HCL 2 MG/2ML IJ SOLN
INTRAMUSCULAR | Status: AC
  Filled 2014-04-27: qty 4

## 2014-04-27 MED ORDER — BUPIVACAINE HCL (PF) 0.25 % IJ SOLN
INTRAMUSCULAR | Status: AC
  Filled 2014-04-27: qty 30

## 2014-04-27 MED ORDER — SODIUM CHLORIDE 0.9 % IV SOLN
Freq: Once | INTRAVENOUS | Status: AC
  Administered 2014-04-27: 08:00:00 via INTRAVENOUS

## 2014-04-27 NOTE — Discharge Instructions (Signed)
1.No stooping ,bending  Or lifting more than 10 lbs for 2 weeks. 2.Use walker to ambulate for 2 weeks. 3. RTC in 2 weeks  KYPHOPLASTY/VERTEBROPLASTY DISCHARGE INSTRUCTIONS  Medications: (check all that apply)     Resume all home medications as before procedure.       Resume your (aspirin/Plavix/Coumadin)                   Continue your pain medications as prescribed as needed.  Over the next 3-5 days, decrease your pain medication as tolerated.  Over the counter medications (i.e. Tylenol, ibuprofen, and aleve) may be substituted once severe/moderate pain symptoms have subsided.   Wound Care: - Bandages may be removed the day following your procedure.  You may get your incision wet once bandages are removed.  Bandaids may be used to cover the incisions until scab formation.  Topical ointments are optional.  - If you develop a fever greater than 101 degrees, have increased skin redness at the incision sites or pus-like oozing from incisions occurring within 1 week of the procedure, contact radiology at [phone] or [phone].  - Ice pack to back for 15-20 minutes 2-3 time per day for first 2-3 days post procedure.  The ice will expedite muscle healing and help with the pain from the incisions.   Activity: - Bedrest today with limited activity for 24 hours post procedure.  - No driving for 48 hours.  - Increase your activity as tolerated after bedrest (with assistance if necessary).  - Refrain from any strenuous activity or heavy lifting (greater than 10 lbs.).   Follow up: - Contact radiology at [phone] or [phone] if any questions/concerns.  - A physician assistant from radiology will contact you in approximately 1 week.  - If a biopsy was performed at the time of your procedure, your referring physician should receive the results in usually 2-3 days.

## 2014-04-27 NOTE — Sedation Documentation (Signed)
Patient denies pain and is resting comfortably.  

## 2014-04-27 NOTE — Progress Notes (Signed)
States feels like going home now and Pam Turpin,PA notified and ok to d/c home

## 2014-04-27 NOTE — H&P (Signed)
Chief Complaint: Low back pain Pain in buttocks x 1 month  Referring Physician(s): Dr Mart Piggs  History of Present Illness: Holly Valenzuela is a 73 y.o. female  Pt has had increasing low back pain x 1 month Has had falls at home- frail / weak MRI at [facility] reveal sacral insufficiency fractures Request for sacroplasty Dr Estanislado Pandy has reviewed imaging Feels anatomy appropriate for procedure  Scheduled now for same Smoker   Past Medical History  Diagnosis Date  . Complication of anesthesia     SEVERE nausea vomiting  . Hyperlipidemia   . Headache(784.0)     HX MIGRAINES  . Arthritis   . Cancer     HX ENDOMETRIAL CANCER   . Depression     Past Surgical History  Procedure Laterality Date  . Cesarean section    . Appendectomy    . Thyroid surgery    . Abdominal hysterectomy    . Total hip arthroplasty Right 01/12/2014    Procedure: RIGHT TOTAL HIP ARTHROPLASTY ANTERIOR APPROACH;  Surgeon: Mcarthur Rossetti, MD;  Location: [facility];  Service: Orthopedics;  Laterality: Right;    Allergies: Adhesive and Codeine  Medications: Prior to Admission medications   Medication Sig Start Date End Date Taking? Authorizing Provider  ALPRAZolam Duanne Moron) 0.5 MG tablet Take 1 tablet (0.5 mg total) by mouth at bedtime. 04/20/14  Yes Maryann Mikhail, DO  aspirin EC 325 MG EC tablet Take 1 tablet (325 mg total) by mouth 2 (two) times daily after a meal. Patient taking differently: Take 325 mg by mouth daily.  01/14/14  Yes Erskine Emery, PA-C  atorvastatin (LIPITOR) 10 MG tablet Take 10 mg by mouth daily.   Yes Historical Provider, MD  cholecalciferol (VITAMIN D) 1000 UNITS tablet Take 1,000 Units by mouth daily.   Yes Historical Provider, MD  cyanocobalamin (,VITAMIN B-12,) 1000 MCG/ML injection Inject 1,000 mcg into the muscle every 30 (thirty) days.    Yes Historical Provider, MD  fluticasone (FLONASE) 50 MCG/ACT nasal spray Place 1 spray into both nostrils daily  as needed for allergies or rhinitis.   Yes Historical Provider, MD  Multiple Vitamin (MULTIVITAMIN WITH MINERALS) TABS Take 1 tablet by mouth every evening.    Yes Historical Provider, MD  nicotine (NICODERM CQ - DOSED IN MG/24 HR) 7 mg/24hr patch Place 1 patch (7 mg total) onto the skin daily. 04/20/14  Yes Maryann Mikhail, DO  oxyCODONE-acetaminophen (ROXICET) 5-325 MG per tablet Take 1-2 tablets by mouth every 4 (four) hours as needed for severe pain. 04/20/14  Yes Maryann Mikhail, DO  pantoprazole (PROTONIX) 40 MG tablet Take 40 mg by mouth daily.   Yes Historical Provider, MD  pseudoephedrine (SUDAFED) 30 MG tablet Take 30 mg by mouth every 4 (four) hours as needed for congestion.   Yes Historical Provider, MD  feeding supplement, ENSURE COMPLETE, (ENSURE COMPLETE) LIQD Take 237 mLs by mouth 2 (two) times daily between meals. 04/20/14   Cristal Ford, DO    History reviewed. No pertinent family history.  History   Social History  . Marital Status: Divorced    Spouse Name: N/A    Number of Children: N/A  . Years of Education: N/A   Social History Main Topics  . Smoking status: Current Every Day Smoker -- 0.25 packs/day for 50 years    Types: Cigarettes  . Smokeless tobacco: Never Used  . Alcohol Use: No  . Drug Use: No  . Sexual Activity: None   Other Topics  Concern  . None   Social History Narrative     Review of Systems: A 12 point ROS discussed and pertinent positives are indicated in the HPI above.  All other systems are negative.  Review of Systems  Constitutional: Positive for activity change and fatigue.  Respiratory: Negative for cough and shortness of breath.   Cardiovascular: Negative for chest pain.  Gastrointestinal: Negative for abdominal pain.  Genitourinary: Negative for difficulty urinating.  Musculoskeletal: Positive for back pain and gait problem.  Neurological: Positive for weakness.  Psychiatric/Behavioral: Negative for behavioral problems and  confusion.    Vital Signs: BP 135/88 mmHg  Pulse 82  Temp(Src) 97.9 F (36.6 C) (Oral)  Resp 18  Ht 5\' 5"  (1.651 m)  Wt 40.37 kg (89 lb)  BMI 14.81 kg/m2  SpO2 95%  Physical Exam  Constitutional: She is oriented to person, place, and time.  Thin/frail  Cardiovascular: Normal rate and regular rhythm.   No murmur heard. Pulmonary/Chest: Effort normal. She has no wheezes.  Abdominal: Soft. Bowel sounds are normal. There is no tenderness.  Musculoskeletal: Normal range of motion.  Uses cane for stability  Neurological: She is alert and oriented to person, place, and time.  Skin: Skin is warm and dry.  Psychiatric: She has a normal mood and affect. Her behavior is normal. Thought content normal.  Nursing note and vitals reviewed.   Imaging: Dg Chest 2 View  04/17/2014   CLINICAL DATA:  Weakness and Chest pain.  EXAM: CHEST  2 VIEW  COMPARISON:  11/21/2011.  FINDINGS: The cardiac silhouette, mediastinal and hilar contours are within normal limits and stable. The lungs demonstrate chronic emphysematous and bronchitic changes but no acute overlying pulmonary process. No pleural effusion. The bony thorax is intact. Remote healed rib fractures are noted.  IMPRESSION: Chronic bronchitic and emphysematous changes but no acute overlying pulmonary process.   Electronically Signed   By: Kalman Jewels M.D.   On: 04/17/2014 13:37    Labs:  CBC:  Recent Labs  01/15/14 0435 04/17/14 1210 04/18/14 0345 04/20/14 0451  WBC 10.6* 8.5 7.6 6.4  HGB 12.1 13.9 12.4 12.7  HCT 35.8* 42.3 38.2 38.8  PLT 189 360 300 317    COAGS:  Recent Labs  01/01/14 1450 04/20/14 0451  INR 0.90 0.98  APTT 32 34    BMP:  Recent Labs  01/13/14 0520 04/17/14 1210 04/18/14 0345 04/20/14 0451  NA 135* 140 139 139  K 4.3 4.0 4.0 4.5  CL 100 101 102 101  CO2 26 27 26 29   GLUCOSE 135* 82 124* 86  BUN 9 13 17 15   CALCIUM 8.6 9.2 8.9 8.8  CREATININE 0.74 0.73 0.78 0.76  GFRNONAA 82* 82* 80*  81*  GFRAA >90 >90 >90 >90    LIVER FUNCTION TESTS:  Recent Labs  04/17/14 1210  BILITOT 0.2*  AST 17  ALT 9  ALKPHOS 178*  PROT 6.5  ALBUMIN 3.4*    TUMOR MARKERS: No results for input(s): AFPTM, CEA, CA199, CHROMGRNA in the last 8760 hours.  Assessment and Plan:  Sacral insufficiency fracture per MRI Back and buttock pain worsening x 1 mo Now scheduled for Sacroplasty Pt and family aware of procedure benefits and risks and agreeable to proceed Consent signed andin chart  Thank you for this interesting consult.  I greatly enjoyed meeting Remmy Crass [facility] and look forward to participating in their care.     I spent a total of 20 minutes face to face in  clinical consultation, greater than 50% of which was counseling/coordinating care for sacroplasty  Signed: TURPIN,PAMELA A 04/27/2014, 7:50 AM

## 2014-04-27 NOTE — Procedures (Signed)
S/P S1 vertebroplasty bilateral approacgh

## 2014-05-04 ENCOUNTER — Emergency Department (HOSPITAL_COMMUNITY)
Admission: EM | Admit: 2014-05-04 | Discharge: 2014-05-04 | Disposition: A | Discharge status: Home / Self Care | Payer: Medicare Other | Attending: Emergency Medicine | Admitting: Emergency Medicine

## 2014-05-04 ENCOUNTER — Emergency Department (HOSPITAL_COMMUNITY): Payer: Medicare Other

## 2014-05-04 ENCOUNTER — Telehealth: Payer: Self-pay | Admitting: Radiology

## 2014-05-04 ENCOUNTER — Encounter (HOSPITAL_COMMUNITY): Payer: Self-pay | Admitting: Nurse Practitioner

## 2014-05-04 DIAGNOSIS — Z8542 Personal history of malignant neoplasm of other parts of uterus: Secondary | ICD-10-CM | POA: Insufficient documentation

## 2014-05-04 DIAGNOSIS — G8918 Other acute postprocedural pain: Secondary | ICD-10-CM | POA: Insufficient documentation

## 2014-05-04 DIAGNOSIS — Z7982 Long term (current) use of aspirin: Secondary | ICD-10-CM | POA: Insufficient documentation

## 2014-05-04 DIAGNOSIS — Z720 Tobacco use: Secondary | ICD-10-CM | POA: Diagnosis not present

## 2014-05-04 DIAGNOSIS — M545 Low back pain: Secondary | ICD-10-CM | POA: Insufficient documentation

## 2014-05-04 DIAGNOSIS — Z9889 Other specified postprocedural states: Secondary | ICD-10-CM | POA: Insufficient documentation

## 2014-05-04 DIAGNOSIS — Z8679 Personal history of other diseases of the circulatory system: Secondary | ICD-10-CM | POA: Diagnosis not present

## 2014-05-04 DIAGNOSIS — F329 Major depressive disorder, single episode, unspecified: Secondary | ICD-10-CM | POA: Diagnosis not present

## 2014-05-04 DIAGNOSIS — E785 Hyperlipidemia, unspecified: Secondary | ICD-10-CM | POA: Insufficient documentation

## 2014-05-04 DIAGNOSIS — M549 Dorsalgia, unspecified: Secondary | ICD-10-CM

## 2014-05-04 DIAGNOSIS — Z79899 Other long term (current) drug therapy: Secondary | ICD-10-CM | POA: Insufficient documentation

## 2014-05-04 DIAGNOSIS — M1990 Unspecified osteoarthritis, unspecified site: Secondary | ICD-10-CM | POA: Insufficient documentation

## 2014-05-04 MED ORDER — OXYCODONE-ACETAMINOPHEN 5-325 MG PO TABS
1.0000 | ORAL_TABLET | Freq: Once | ORAL | Status: AC
Start: 2014-05-04 — End: 2014-05-04
  Administered 2014-05-04: 1 via ORAL

## 2014-05-04 MED ORDER — MORPHINE SULFATE 2 MG/ML IJ SOLN
2.0000 mg | Freq: Once | INTRAMUSCULAR | Status: AC
Start: 2014-05-04 — End: 2014-05-04
  Administered 2014-05-04: 2 mg via INTRAVENOUS

## 2014-05-04 MED ORDER — ONDANSETRON HCL 4 MG/2ML IJ SOLN
4.0000 mg | Freq: Once | INTRAMUSCULAR | Status: AC
  Administered 2014-05-04: 4 mg via INTRAVENOUS

## 2014-05-04 MED ORDER — MORPHINE SULFATE 2 MG/ML IJ SOLN
2.0000 mg | Freq: Once | INTRAMUSCULAR | Status: AC
  Administered 2014-05-04: 2 mg via INTRAVENOUS

## 2014-05-04 MED ORDER — OXYCODONE-ACETAMINOPHEN 5-325 MG PO TABS: 1.0000 | ORAL_TABLET | Freq: Three times a day (TID) | ORAL | Status: DC | PRN

## 2014-05-04 NOTE — ED Notes (Signed)
Pt ambulated to and from bathroom with minimal assistance, tolerated well.

## 2014-05-04 NOTE — ED Provider Notes (Signed)
Medical screening examination/treatment/procedure(s) were conducted as a shared visit with non-physician practitioner(s) and myself.  I personally evaluated the patient during the encounter.  Pt has history of recent kyphoplasty.  Pt is having more pain now radiating down her left leg.  The pain is sharp and radiates towards her foot.  No weakness or numbness.  Will check plan films, provide pain meds and reassess.  Dorie Rank, MD 05/04/14 [phone]

## 2014-05-04 NOTE — ED Provider Notes (Signed)
CSN: [id number]     Arrival date & time 05/04/14  1120 History   First MD Initiated Contact with Patient 05/04/14 1130     Chief Complaint  Patient presents with  . Back Pain   HPI  Patient is a 73 year old female who presents to the emergency room for evaluation of left leg and low back pain. Patient states that she recently had a sacral plasty and kyphoplasty done approximately one week ago by Dr. Jamesetta So secondary to compression fractures in her back. Patient states that since the surgery she has been having pain that is radiating down her left leg to her ankle. She states the pain is a burning pain and kind of comes and goes. She was taking ibuprofen and Aleve at home with no relief. She found some old Percocet from previous surgeries which took care of her pain. She is out of her Percocet at home. She is due to follow-up with her interventional radiologist next week in the office. Per chart review patient was having these symptoms prior to her surgery. They have not gotten any better since the surgery. He states that standing and walking make her pain feel worse. She has noticed no discharge or drainage from her wounds. She denies fevers chills nausea vomiting. She denies loss of bowel or bladder. She denies any tingling or numbness. She states that she has been taking her medications as prescribed. Patient states that she has been having difficult time at home because she cannot get up to make her food or do things due to pain. She feels that she's lost weight since the surgery. She does not have anybody that lives at home that can help her.  Past Medical History  Diagnosis Date  . Complication of anesthesia     SEVERE nausea vomiting  . Hyperlipidemia   . Headache(784.0)     HX MIGRAINES  . Arthritis   . Cancer     HX ENDOMETRIAL CANCER   . Depression    Past Surgical History  Procedure Laterality Date  . Cesarean section    . Appendectomy    . Thyroid surgery    . Abdominal  hysterectomy    . Total hip arthroplasty Right 01/12/2014    Procedure: RIGHT TOTAL HIP ARTHROPLASTY ANTERIOR APPROACH;  Surgeon: Mcarthur Rossetti, MD;  Location: [facility];  Service: Orthopedics;  Laterality: Right;  . Kyphoplasty    . Vertebroplasty     No family history on file. History  Substance Use Topics  . Smoking status: Current Every Day Smoker -- 0.25 packs/day for 50 years    Types: Cigarettes  . Smokeless tobacco: Never Used  . Alcohol Use: No   OB History    No data available     Review of Systems  Constitutional: Negative for fever, chills and fatigue.  Gastrointestinal: Negative for nausea and vomiting.  Genitourinary: Negative for enuresis.  Musculoskeletal: Positive for back pain, arthralgias and gait problem. Negative for myalgias, joint swelling, neck pain and neck stiffness.  Skin: Negative for color change, rash and wound.  All other systems reviewed and are negative.     Allergies  Adhesive and Codeine  Home Medications   Prior to Admission medications   Medication Sig Start Date End Date Taking? Authorizing Provider  ALPRAZolam Duanne Moron) 0.5 MG tablet Take 1 tablet (0.5 mg total) by mouth at bedtime. 04/20/14  Yes Maryann Mikhail, DO  aspirin EC 81 MG tablet Take 81 mg by mouth daily.   Yes Historical  Provider, MD  atorvastatin (LIPITOR) 10 MG tablet Take 10 mg by mouth daily.   Yes Historical Provider, MD  butalbital-acetaminophen-caffeine (FIORICET, ESGIC) 50-325-40 MG per tablet Take 1 tablet by mouth 2 (two) times daily as needed for headache.  04/20/14  Yes Historical Provider, MD  cholecalciferol (VITAMIN D) 1000 UNITS tablet Take 1,000 Units by mouth daily.   Yes Historical Provider, MD  cyanocobalamin (,VITAMIN B-12,) 1000 MCG/ML injection Inject 1,000 mcg into the muscle every 30 (thirty) days.    Yes Historical Provider, MD  feeding supplement, ENSURE COMPLETE, (ENSURE COMPLETE) LIQD Take 237 mLs by mouth 2 (two) times daily between meals.  04/20/14  Yes Maryann Mikhail, DO  Multiple Vitamin (MULTIVITAMIN WITH MINERALS) TABS Take 1 tablet by mouth every evening.    Yes Historical Provider, MD  nicotine (NICODERM CQ - DOSED IN MG/24 HR) 7 mg/24hr patch Place 1 patch (7 mg total) onto the skin daily. 04/20/14  Yes Maryann Mikhail, DO  pantoprazole (PROTONIX) 40 MG tablet Take 40 mg by mouth daily.   Yes Historical Provider, MD  pseudoephedrine (SUDAFED) 30 MG tablet Take 30 mg by mouth every 4 (four) hours as needed for congestion.   Yes Historical Provider, MD  aspirin EC 325 MG EC tablet Take 1 tablet (325 mg total) by mouth 2 (two) times daily after a meal. Patient not taking: Reported on 05/04/2014 01/14/14   Erskine Emery, PA-C  oxyCODONE-acetaminophen (PERCOCET) 5-325 MG per tablet Take 1-2 tablets by mouth every 8 (eight) hours as needed. 05/04/14   Mattia Osterman A Forcucci, PA-C  oxyCODONE-acetaminophen (ROXICET) 5-325 MG per tablet Take 1-2 tablets by mouth every 4 (four) hours as needed for severe pain. Patient not taking: Reported on 05/04/2014 04/20/14   Maryann Mikhail, DO   BP 121/74 mmHg  Pulse 61  Temp(Src) 98.4 F (36.9 C) (Oral)  Resp 16  SpO2 97% Physical Exam  Constitutional: She is oriented to person, place, and time. She appears well-developed and well-nourished. No distress.  HENT:  Head: Normocephalic and atraumatic.  Mouth/Throat: Oropharynx is clear and moist. No oropharyngeal exudate.  Eyes: Conjunctivae and EOM are normal. Pupils are equal, round, and reactive to light. No scleral icterus.  Neck: Normal range of motion. Neck supple. No JVD present. No thyromegaly present.  Cardiovascular: Normal rate, regular rhythm, normal heart sounds and intact distal pulses.  Exam reveals no gallop and no friction rub.   No murmur heard. Pulmonary/Chest: Effort normal and breath sounds normal. No respiratory distress. She has no wheezes. She has no rales. She exhibits no tenderness.  Abdominal: Soft. Bowel sounds are  normal. She exhibits no distension and no mass. There is no tenderness. There is no rebound and no guarding.  Musculoskeletal:  Patient rises slowly from sitting to standing.  They walk without an antalgic gait.  There is no evidence of erythema, ecchymosis, or gross deformity. There is a well-healing 1 cm incision slightly superior to the sacrum. There is no erythema, warmth, or discharge from the incision.  There is minimal tenderness to palpation over lumbar and sacral spine.  Active ROM is limited due to pain.  Sensation to light touch is intact over all extremities.  Strength is symmetric and equal in all extremities.    Lymphadenopathy:    She has no cervical adenopathy.  Neurological: She is alert and oriented to person, place, and time. She has normal strength. No cranial nerve deficit or sensory deficit. Coordination normal.  Skin: Skin is warm and dry. She is  not diaphoretic.  Psychiatric: She has a normal mood and affect. Her behavior is normal. Judgment and thought content normal.  Nursing note and vitals reviewed.   ED Course  Procedures (including critical care time) Labs Review Labs Reviewed - No data to display  Imaging Review Dg Lumbar Spine Complete  05/04/2014   CLINICAL DATA:  Back and LEFT hip pain, cement inserted 1 week ago  EXAM: LUMBAR SPINE - COMPLETE 4+ VIEW  COMPARISON:  Sacroplasty images 11 12/18/2013  FINDINGS: Diffuse osseous demineralization.  Five non-rib-bearing lumbar vertebrae.  Vertebral body heights maintained.  Mild retrolisthesis at L3-L4 and mild anterolisthesis at L4-L5 noted.  No fracture or bone destruction.  Post BILATERAL sacroplasty at S1 segment of sacrum bilaterally.  SI joints symmetric.  Biconvex thoracolumbar scoliosis.  Facet degenerative changes lower lumbar spine.  Atherosclerotic calcification aorta.  IMPRESSION: Post BILATERAL sacroplasty.  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  No definite acute abnormalities.   Electronically Signed   By: Lavonia Dana M.D.   On: 05/04/2014 14:23     EKG Interpretation None      MDM   Final diagnoses:  Back pain  Status post kyphoplasty   Patient is a 73 year old female who presents emergency room for evaluation of back pain and left leg pain. Physical exam reveals alert and nontoxic appearing female with well-healing surgical incision, and no focal neurological deficits. Given history this sounds suspiciously like sciatica.  Plain film x-ray here reveals no acute changes. Patient was treated here with Percocet and 2 mg of morphine. She had good relief of her pain. I have spoken with Ignacia Palma the interventional radiology PA who feels that this is likely a pain control issue. She spoke with Dr. Jamesetta So who believes that the patient should be sent home with pain medication and should follow-up for her scheduled appointment next week. I will discharge the patient home with Percocet for pain control. I have urged the patient to keep her scheduled appointment. Given her difficulty at home with no help I will try to set up a home health aide to help her. Patient states understanding and agreement at this time. There are no red flag symptoms for cauda equina. Patient is to return for intractable pain despite taking pain medications, fevers, chills, loss of bowel or bladder, or saddle anesthesias. Patient states understanding and agreement at this time. Patient was seen by and discussed with Dr. Tomi Bamberger who agrees with the above workup and plan.    Cherylann Parr, PA-C 05/04/14 1523

## 2014-05-04 NOTE — ED Notes (Signed)
Per EMS pt had recent surgery with Dr. Dora Sims on back, pt c/o continued 10/10 back pain. MD sts to come to ED and he will follow up. Pt sts able to walk around at home but no relief and unable to get to the ER so she called the ambulance.

## 2014-05-04 NOTE — Progress Notes (Signed)
Patient called c/o same intense buttock pain with LLE radiation of pain and inability to walk as prior to sacroplasty on 04/27/14. She denies that pain is worse or new site of pain, but that her overall discomfort has not changed since the procedure. Dr. Estanislado Pandy was notified and patient was instructed to go to ED per Dr. Estanislado Pandy.  Tsosie Billing PA-C Interventional Radiology  05/04/2014 11:34 AM

## 2014-05-04 NOTE — Discharge Instructions (Signed)
Back Pain, Adult Low back pain is very common. About 1 in 5 people have back pain.The cause of low back pain is rarely dangerous. The pain often gets better over time.About half of people with a sudden onset of back pain feel better in just 2 weeks. About 8 in 10 people feel better by 6 weeks.  CAUSES Some common causes of back pain include:  Strain of the muscles or ligaments supporting the spine.  Wear and tear (degeneration) of the spinal discs.  Arthritis.  Direct injury to the back. DIAGNOSIS Most of the time, the direct cause of low back pain is not known.However, back pain can be treated effectively even when the exact cause of the pain is unknown.Answering your caregiver's questions about your overall health and symptoms is one of the most accurate ways to make sure the cause of your pain is not dangerous. If your caregiver needs more information, he or she may order lab work or imaging tests (X-rays or MRIs).However, even if imaging tests show changes in your back, this usually does not require surgery. HOME CARE INSTRUCTIONS For many people, back pain returns.Since low back pain is rarely dangerous, it is often a condition that people can learn to manageon their own.   Remain active. It is stressful on the back to sit or stand in one place. Do not sit, drive, or stand in one place for more than 30 minutes at a time. Take short walks on level surfaces as soon as pain allows.Try to increase the length of time you walk each day.  Do not stay in bed.Resting more than 1 or 2 days can delay your recovery.  Do not avoid exercise or work.Your body is made to move.It is not dangerous to be active, even though your back may hurt.Your back will likely heal faster if you return to being active before your pain is gone.  Pay attention to your body when you bend and lift. Many people have less discomfortwhen lifting if they bend their knees, keep the load close to their bodies,and  avoid twisting. Often, the most comfortable positions are those that put less stress on your recovering back.  Find a comfortable position to sleep. Use a firm mattress and lie on your side with your knees slightly bent. If you lie on your back, put a pillow under your knees.  Only take over-the-counter or prescription medicines as directed by your caregiver. Over-the-counter medicines to reduce pain and inflammation are often the most helpful.Your caregiver may prescribe muscle relaxant drugs.These medicines help dull your pain so you can more quickly return to your normal activities and healthy exercise.  Put ice on the injured area.  Put ice in a plastic bag.  Place a towel between your skin and the bag.  Leave the ice on for 15-20 minutes, 03-04 times a day for the first 2 to 3 days. After that, ice and heat may be alternated to reduce pain and spasms.  Ask your caregiver about trying back exercises and gentle massage. This may be of some benefit.  Avoid feeling anxious or stressed.Stress increases muscle tension and can worsen back pain.It is important to recognize when you are anxious or stressed and learn ways to manage it.Exercise is a great option. SEEK MEDICAL CARE IF:  You have pain that is not relieved with rest or medicine.  You have pain that does not improve in 1 week.  You have new symptoms.  You are generally not feeling well. SEEK   IMMEDIATE MEDICAL CARE IF:   You have pain that radiates from your back into your legs.  You develop new bowel or bladder control problems.  You have unusual weakness or numbness in your arms or legs.  You develop nausea or vomiting.  You develop abdominal pain.  You feel faint. Document Released: 05/18/2005 Document Revised: 11/17/2011 Document Reviewed: 09/19/2013 ExitCare Patient Information 2015 [employer]. This information is not intended to replace advice given to you by your health care provider. Make sure you  discuss any questions you have with your health care provider.  

## 2014-05-07 DIAGNOSIS — M79605 Pain in left leg: Secondary | ICD-10-CM | POA: Insufficient documentation

## 2014-05-10 ENCOUNTER — Telehealth (HOSPITAL_COMMUNITY): Payer: Self-pay | Admitting: Interventional Radiology

## 2014-05-10 NOTE — Telephone Encounter (Signed)
Holly Valenzuela w/ Dr. Silvestre Mesi office, told her that per [facility] patient needs a new MRI lumbar and pelvis ordered. She said that she would arrange for this and send [place] the report and give the patient the CD to give to [place]. JM

## 2014-05-18 ENCOUNTER — Other Ambulatory Visit (HOSPITAL_COMMUNITY): Payer: Self-pay | Admitting: Interventional Radiology

## 2014-05-18 DIAGNOSIS — M549 Dorsalgia, unspecified: Secondary | ICD-10-CM

## 2014-05-21 ENCOUNTER — Other Ambulatory Visit: Payer: Self-pay | Admitting: Internal Medicine

## 2014-05-21 ENCOUNTER — Ambulatory Visit (HOSPITAL_COMMUNITY)
Admission: RE | Admit: 2014-05-21 | Discharge: 2014-05-21 | Disposition: A | Discharge status: Home / Self Care | Payer: Medicare Other | Source: Ambulatory Visit | Attending: Interventional Radiology | Admitting: Interventional Radiology

## 2014-05-21 DIAGNOSIS — M549 Dorsalgia, unspecified: Secondary | ICD-10-CM

## 2014-05-21 DIAGNOSIS — M5416 Radiculopathy, lumbar region: Secondary | ICD-10-CM

## 2014-05-24 ENCOUNTER — Ambulatory Visit
Admission: RE | Admit: 2014-05-24 | Discharge: 2014-05-24 | Disposition: A | Discharge status: Home / Self Care | Payer: Medicare Other | Source: Ambulatory Visit | Attending: Internal Medicine | Admitting: Internal Medicine

## 2014-05-24 DIAGNOSIS — M5416 Radiculopathy, lumbar region: Secondary | ICD-10-CM

## 2014-05-24 MED ORDER — IOHEXOL 180 MG/ML  SOLN
1.0000 mL | Freq: Once | INTRAMUSCULAR | Status: AC | PRN
  Administered 2014-05-24: 1 mL via EPIDURAL

## 2014-05-24 MED ORDER — METHYLPREDNISOLONE ACETATE 40 MG/ML INJ SUSP (RADIOLOG
120.0000 mg | Freq: Once | INTRAMUSCULAR | Status: AC
  Administered 2014-05-24: 120 mg via EPIDURAL

## 2014-05-24 NOTE — Discharge Instructions (Signed)

## 2014-07-25 DIAGNOSIS — H8110 Benign paroxysmal vertigo, unspecified ear: Secondary | ICD-10-CM | POA: Insufficient documentation

## 2014-07-25 DIAGNOSIS — N390 Urinary tract infection, site not specified: Secondary | ICD-10-CM | POA: Insufficient documentation

## 2015-12-10 ENCOUNTER — Encounter: Payer: Self-pay | Admitting: Gastroenterology

## 2016-02-13 ENCOUNTER — Encounter: Payer: Self-pay | Admitting: Gastroenterology

## 2016-02-13 ENCOUNTER — Ambulatory Visit (INDEPENDENT_AMBULATORY_CARE_PROVIDER_SITE_OTHER): Payer: Medicare Other | Admitting: Gastroenterology

## 2016-02-13 VITALS — BP 112/78 | HR 92 | Ht 65.0 in | Wt 85.0 lb

## 2016-02-13 DIAGNOSIS — K5730 Diverticulosis of large intestine without perforation or abscess without bleeding: Secondary | ICD-10-CM | POA: Diagnosis not present

## 2016-02-13 DIAGNOSIS — R1032 Left lower quadrant pain: Secondary | ICD-10-CM | POA: Diagnosis not present

## 2016-02-13 DIAGNOSIS — R634 Abnormal weight loss: Secondary | ICD-10-CM

## 2016-02-13 DIAGNOSIS — K5900 Constipation, unspecified: Secondary | ICD-10-CM | POA: Diagnosis not present

## 2016-02-13 NOTE — Progress Notes (Signed)
[facility] Consult Note:  History: [name] 02/13/2016  Referring physician: Jerlyn Ly, MD  Reason for consult/chief complaint: Abdominal Pain (pt reports LLQ that usually occurs when she is constipated; pt reports recently using fleet suppositories and enemas with little success)   Subjective  HPI:  Holly Valenzuela was referred by primary care for ongoing constipation and left lower quadrant pain. She reports years of tendencies to constipation, but it seems to have gotten significantly worse in the last 6 months. She has feelings of a "knot" in the left lower quadrant and frequently has crampy pain in that area. It is sometimes relieved after a bowel movement. The pain is nonradiating and there is no associated nausea or vomiting. Her appetite has been decreased, which she feels is due to this pain. This has led to an unclear amount of weight loss, but she is currently 85 pounds. Holly Valenzuela has had little or no improvement taking over-the-counter stool softeners including MiraLAX, Colace ,ducolax (it required 7 tablets for effect), and fleets enemas. She denies rectal bleeding. Her last colonoscopy was with Dr. Olevia Perches in 2007, at which time severe left-sided diverticulosis was discovered and several hyperplastic polyps were removed.  ROS:  Review of Systems  Constitutional: Negative for appetite change and unexpected weight change.  HENT: Negative for mouth sores and voice change.   Eyes: Negative for pain and redness.  Respiratory: Negative for cough and shortness of breath.   Cardiovascular: Negative for chest pain and palpitations.  Genitourinary: Negative for dysuria and hematuria.  Musculoskeletal: Positive for arthralgias. Negative for myalgias.       Right hip pain  Skin: Negative for pallor and rash.  Neurological: Negative for weakness and headaches.  Hematological: Negative for adenopathy.     Past Medical History: Past Medical History:  Diagnosis Date   . Arthritis   . Basal cell adenocarcinoma    right hand  . Complication of anesthesia    SEVERE nausea vomiting  . Depression   . DJD (degenerative joint disease)   . Endometrial cancer ([facility])   . Genital warts   . Headache(784.0)    HX MIGRAINES  . Hyperlipidemia   . Hypertension   . Migraine headache    Occular   . Osteoporosis hypetens  . Recurrent UTI   . Tobacco abuse   . Vitamin B12 deficiency   . Vitamin D deficiency      Past Surgical History: Past Surgical History:  Procedure Laterality Date  . ABDOMINAL HYSTERECTOMY    . APPENDECTOMY    . CARPAL TUNNEL RELEASE    . CESAREAN SECTION    . KYPHOPLASTY    . THYROID SURGERY     nodule removal  . 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 Alzheimer's disease Mother   . Heart disease Mother   . Stroke Father     Social History: Social History   Social History  . Marital status: Divorced    Spouse name: N/A  . Number of children: N/A  . Years of education: N/A   Social History Main Topics  . Smoking status: Current Every Day Smoker    Packs/day: 0.50    Years: 50.00    Types: Cigarettes  . Smokeless tobacco: Never Used  .  Alcohol use No  . Drug use: No  . Sexual activity: Not Asked   Other Topics Concern  . None   Social History Narrative  . None    Allergies: No Active Allergies  Outpatient Meds: Current Outpatient Prescriptions  Medication Sig Dispense Refill  . ALPRAZolam (XANAX) 0.5 MG tablet Take 1 tablet (0.5 mg total) by mouth at bedtime. 30 tablet 0  . aspirin EC 81 MG tablet Take 81 mg by mouth daily.    Marland Kitchen atorvastatin (LIPITOR) 10 MG tablet Take 10 mg by mouth daily.    . butalbital-acetaminophen-caffeine (FIORICET, ESGIC)  50-325-40 MG per tablet Take 1 tablet by mouth 2 (two) times daily as needed for headache.   6  . cholecalciferol (VITAMIN D) 1000 UNITS tablet Take 1,000 Units by mouth daily.    . cyanocobalamin (,VITAMIN B-12,) 1000 MCG/ML injection Inject 1,000 mcg into the muscle every 30 (thirty) days.     . feeding supplement, ENSURE COMPLETE, (ENSURE COMPLETE) LIQD Take 237 mLs by mouth 2 (two) times daily between meals.    . fluticasone (VERAMYST) 27.5 MCG/SPRAY nasal spray Place 2 sprays into the nose daily.    . Multiple Vitamin (MULTIVITAMIN WITH MINERALS) TABS Take 1 tablet by mouth every evening.     . pantoprazole (PROTONIX) 40 MG tablet Take 40 mg by mouth daily.    . pseudoephedrine (SUDAFED) 30 MG tablet Take 30 mg by mouth every 4 (four) hours as needed for congestion.    . Teriparatide, Recombinant, (FORTEO) 600 MCG/2.4ML SOLN Inject 20 mcg into the skin daily.     No current facility-administered medications for this visit.       ___________________________________________________________________ Objective   Exam:  BP 112/78   Pulse 92   Ht 5\' 5"  (1.651 m)   Wt 85 lb (38.6 kg)   BMI 14.14 kg/m    General: this is a(n) Petite elderly woman with poor muscle mass   Eyes: sclera anicteric, no redness  ENT: oral mucosa moist without lesions, no cervical or supraclavicular lymphadenopathy, good dentition  CV: RRR without murmur, S1/S2, no JVD, no peripheral edema  Resp: clear to auscultation bilaterally, normal RR and effort noted  GI: soft, mild LLQ tenderness, with active bowel sounds. No guarding or palpable organomegaly noted. No mass felt Skin; warm and dry, no rash or jaundice noted  Neuro: awake, alert and oriented x 3. Normal gross motor function and fluent speech  Labs:  12/06/15 CBC nml, TSH, Calcium, Albumin normal  Colonoscopy - [facility] - 12/2005  Several hyperplastic polyps and marked left-sided diverticulosis  Assessment: Encounter Diagnoses  Name Primary?   . Constipation, unspecified constipation type Yes  . LLQ pain   . Diverticulosis of colon without hemorrhage   . Abnormal loss of weight     I suspect this is from worsening diverticulosis causing luminal narrowing and tortuosity. Neoplasia is less likely, but must be ruled out due to the weight loss. I am encouraged that she has normal hemoglobin and albumin.  Plan:  Colonoscopy  The benefits and risks of the planned procedure were described in detail with the patient or (when appropriate) their health care proxy.  Risks were outlined as including, but not limited to, bleeding, infection, perforation, adverse medication reaction leading to cardiac or pulmonary decompensation, or pancreatitis (if ERCP).  The limitation of incomplete mucosal visualization was also discussed.  No guarantees or warranties were given.  Samples of Amitiza 72 g once daily were given for a 10-14 day  trial.  Thank you for the courtesy of this consult.  Please call me with any questions or concerns.  Nelida Meuse III  CC: Jerlyn Ly, MD

## 2016-02-13 NOTE — Patient Instructions (Signed)
If you are age 76 or older, your body mass index should be between 23-30. Your Body mass index is 14.14 kg/m. If this is out of the aforementioned range listed, please consider follow up with your Primary Care Provider.  If you are age 7 or younger, your body mass index should be between 19-25. Your Body mass index is 14.14 kg/m. If this is out of the aformentioned range listed, please consider follow up with your Primary Care Provider.   It has been recommended to you by your physician that you have a(n) Colonoscopy completed. Per your request, we did not schedule the procedure(s) today. Please contact our office at [phone] should you decide to have the procedure completed.  We have given you samples of the following medication to take: Linzess 72 mcg. Please take one a day. Call the office when completed with an update. [phone]  Thank you for choosing [facility]  Dr Wilfrid Lund III

## 2016-02-14 ENCOUNTER — Encounter: Payer: Self-pay | Admitting: Gastroenterology

## 2016-02-27 ENCOUNTER — Ambulatory Visit (AMBULATORY_SURGERY_CENTER): Payer: Self-pay | Admitting: *Deleted

## 2016-02-27 ENCOUNTER — Telehealth: Payer: Self-pay | Admitting: *Deleted

## 2016-02-27 VITALS — Ht 65.0 in | Wt 85.0 lb

## 2016-02-27 DIAGNOSIS — K5900 Constipation, unspecified: Secondary | ICD-10-CM

## 2016-02-27 DIAGNOSIS — K5909 Other constipation: Secondary | ICD-10-CM

## 2016-02-27 MED ORDER — NA SULFATE-K SULFATE-MG SULF 17.5-3.13-1.6 GM/177ML PO SOLN: ORAL | 0 refills | Status: DC

## 2016-02-27 MED ORDER — LINACLOTIDE 145 MCG PO CAPS: 145.0000 ug | ORAL_CAPSULE | Freq: Every day | ORAL | 1 refills | Status: DC

## 2016-02-27 NOTE — Telephone Encounter (Signed)
Dr. Loletha Carrow' recommendations noted.  I sent in [place] rx to pharmacy.  Unable to reach pt to let her know.   Evern Bio, MD  Laverna Peace, RN        Yes, I would like a Rx for Linzess 145 micrograms once daily called in. Disp #30, RF 1   In the future, if the patient is here and time of the essence, you are also welcome to find me between procedures when I am in the endo lab for the day.   - Thanks very much.   - HD   Previous Messages    ----- Message -----  From: Laverna Peace, RN  Sent: 02/27/2016  2:40 PM  To: Doran Stabler, MD   Dr. Loletha Carrow,   This pt is here for her PV now. Her colonoscopy in 03-05-16. She saw you in the office on 02-13-16. She has finished her Linzess samples. She needed to take 2 tablets daily to have regular bowel movements. Since she has run out, she has had to use Fleets enemas and OTC laxatives to have bowel movements. She was to call you with an update. Do you want a rx of Linzess called in or address it at her procedure on the 5th? I'm sending this high importance to see if I can get her answer before her PV is over.   Thanks,  [employer]

## 2016-02-27 NOTE — Progress Notes (Signed)
No egg or soy allergy  No home oxygen used or diet medications taken  Pt does have nausea with anesthesia but no intubation problems

## 2016-02-28 NOTE — Telephone Encounter (Signed)
Called pt and advised Linzess Rx sent to pharmacy yesterday per Randall Hiss, RN                                                                      Angela/PV

## 2016-03-04 ENCOUNTER — Telehealth: Payer: Self-pay | Admitting: *Deleted

## 2016-03-04 ENCOUNTER — Encounter: Payer: Self-pay | Admitting: Gastroenterology

## 2016-03-04 NOTE — Telephone Encounter (Signed)
Pt drank first bottle prep this am - she vomited some of her prep when she drank it- she states she thinks she vomited half her bottle of prep this am- she states she read the instructions wrong and knows she was to drink it AT 6 PM BUT for some reason she drank it this am. She has not had a bowel movement as of yet. Please advise further instructions.   Lenard Galloway RN

## 2016-03-04 NOTE — Telephone Encounter (Signed)
Please give her instructions for a Miralax prep to start at 5 pm today

## 2016-03-04 NOTE — Telephone Encounter (Signed)
Called patient and instructed per Dr Loletha Carrow, we are going to change her to a miralax prep. Pt instructed to buy miralax 238 grams, dulcolax 5 mg laxative tablets, and she states she has pedialyte that she drinks daily and asked if she could use this instead of gatorade and instructed yes, she needs 64 ounces which she has at home.    Pt instructed to take 2 dulcolax at 3 pm and again at 7 pm today  and instructed to drink at 5 pm today  -8 ounces of the mixture every 15 minutes or so until 32 ounces is gone. Instructed her she can drink it slowly, try a straw to get it down and then JUST clear liquids the rest of today.         Pt instructed to take the other 32 ounces of the miralax mixture starting 1030 am Thursday the day of her procedure - 8 oz every 15 minutes or so until this 32 ounces is in followed by 16 ounces of clears until 1230 pm.  Instructed pt that she needs to drink the prep slowly so she doesn't vomit and its important she drink lots of liquids throughout today and tomorrow until 1230 then all liquids stop.  Pt verbalized understanding- she read back the instructions to me that we discussed .  Pt states she has had 2 BM's since we spoke earlier but are formed stools.  Gave number to call with further questions  Lenard Galloway RN

## 2016-03-05 ENCOUNTER — Ambulatory Visit (AMBULATORY_SURGERY_CENTER): Payer: Medicare Other | Admitting: Gastroenterology

## 2016-03-05 ENCOUNTER — Encounter: Payer: Self-pay | Admitting: Gastroenterology

## 2016-03-05 VITALS — BP 149/83 | HR 71 | Temp 98.4°F | Resp 13 | Ht 65.0 in | Wt 85.0 lb

## 2016-03-05 DIAGNOSIS — K5730 Diverticulosis of large intestine without perforation or abscess without bleeding: Secondary | ICD-10-CM

## 2016-03-05 DIAGNOSIS — K5900 Constipation, unspecified: Secondary | ICD-10-CM | POA: Diagnosis present

## 2016-03-05 MED ORDER — SODIUM CHLORIDE 0.9 % IV SOLN: 500.0000 mL | INTRAVENOUS | Status: DC

## 2016-03-05 NOTE — Progress Notes (Signed)
A and O x3. Report to RN. Tolerated MAC anesthesia well. 

## 2016-03-05 NOTE — Op Note (Signed)
[facility] Patient Name: Holly Valenzuela Procedure Date: 03/05/2016 3:10 PM MRN: [id number] Endoscopist: Mallie Mussel L. Loletha Carrow , MD Age: 75 Referring MD:  Date of Birth: December 12, 1940 Gender: Female Account #: [id number] Procedure:                Colonoscopy Indications:              Constipation Medicines:                Monitored Anesthesia Care Procedure:                Pre-Anesthesia Assessment:                           - Prior to the procedure, a History and Physical                            was performed, and patient medications and                            allergies were reviewed. The patient's tolerance of                            previous anesthesia was also reviewed. The risks                            and benefits of the procedure and the sedation                            options and risks were discussed with the patient.                            All questions were answered, and informed consent                            was obtained. Anticoagulants: The patient has taken                            aspirin. It was decided not to withhold this                            medication prior to the procedure. ASA Grade                            Assessment: III - A patient with severe systemic                            disease. After reviewing the risks and benefits,                            the patient was deemed in satisfactory condition to                            undergo the procedure.  After obtaining informed consent, the colonoscope                            was passed under direct vision. Throughout the                            procedure, the patient's blood pressure, pulse, and                            oxygen saturations were monitored continuously. The                            Model PCF-H190L [id number]) scope was introduced                            through the anus and advanced only to the the          distal transverse colon. The colonoscopy was                            extremely difficult due to significant looping and                            a tortuous colon. The pediatric colonoscope was                            unable to pass the sigmoid, so it was removed and                            replaced with the adult upper endoscope. However,                            that scope was only able to pass to the distal                            transverse colon due to looping. The patient                            tolerated the procedure well. The quality of the                            bowel preparation was fair. The rectum was                            photographed. The bowel preparation used was                            Miralax and SUPREP. Scope In: 3:25:48 PM Scope Out: 3:46:40 PM Total Procedure Duration: 0 hours 20 minutes 52 seconds  Findings:                 The digital rectal exam findings include decreased  sphincter tone.                           Many small and large-mouthed diverticula were found                            in the left colon. There was associated severe                            tortuosity (see above). Complications:            No immediate complications. Estimated Blood Loss:     Estimated blood loss: none. Impression:               - Preparation of the colon was fair.                           - Decreased sphincter tone found on digital rectal                            exam.                           - Diverticulosis in the left colon.                           - No specimens collected.                           The worsening chronic constipation appears to be                            from diverticulosis with associated tortuosity of                            the colon and NOT from obstructing neoplasm. Recommendation:           - Patient has a contact number available for                             emergencies. The signs and symptoms of potential                            delayed complications were discussed with the                            patient. Return to normal activities tomorrow.                            Written discharge instructions were provided to the                            patient.                           - Resume previous diet.                           -  Continue present medications.                           - No repeat colonoscopy due to age. Henry L. Loletha Carrow, MD 03/05/2016 3:56:47 PM This report has been signed electronically.

## 2016-03-05 NOTE — Patient Instructions (Signed)
Recommendation/Impressions:  Diverticulosis handout given. High fiber diet handout given.  No repeat colonoscopy needed. YOU HAD AN ENDOSCOPIC PROCEDURE TODAY AT [facility]:   Refer to the procedure report that was given to you for any specific questions about what was found during the examination.  If the procedure report does not answer your questions, please call your gastroenterologist to clarify.  If you requested that your care partner not be given the details of your procedure findings, then the procedure report has been included in a sealed envelope for you to review at your convenience later.  YOU SHOULD EXPECT: Some feelings of bloating in the abdomen. Passage of more gas than usual.  Walking can help get rid of the air that was put into your GI tract during the procedure and reduce the bloating. If you had a lower endoscopy (such as a colonoscopy or flexible sigmoidoscopy) you may notice spotting of blood in your stool or on the toilet paper. If you underwent a bowel prep for your procedure, you may not have a normal bowel movement for a few days.  Please Note:  You might notice some irritation and congestion in your nose or some drainage.  This is from the oxygen used during your procedure.  There is no need for concern and it should clear up in a day or so.  SYMPTOMS TO REPORT IMMEDIATELY:   Following lower endoscopy (colonoscopy or flexible sigmoidoscopy):  Excessive amounts of blood in the stool  Significant tenderness or worsening of abdominal pains  Swelling of the abdomen that is new, acute  Fever of 100F or higher   For urgent or emergent issues, a gastroenterologist can be reached at any hour by calling [phone].   DIET:  We do recommend a small meal at first, but then you may proceed to your regular diet.  Drink plenty of fluids but you should avoid alcoholic beverages for 24 hours.  ACTIVITY:  You should plan to take it easy for the rest of  today and you should NOT DRIVE or use heavy machinery until tomorrow (because of the sedation medicines used during the test).    FOLLOW UP: Our staff will call the number listed on your records the next business day following your procedure to check on you and address any questions or concerns that you may have regarding the information given to you following your procedure. If we do not reach you, we will leave a message.  However, if you are feeling well and you are not experiencing any problems, there is no need to return our call.  We will assume that you have returned to your regular daily activities without incident.  If any biopsies were taken you will be contacted by phone or by letter within the next 1-3 weeks.  Please call us at [phone] if you have not heard about the biopsies in 3 weeks.    SIGNATURES/CONFIDENTIALITY: You and/or your care partner have signed paperwork which will be entered into your electronic medical record.  These signatures attest to the fact that that the information above on your After Visit Summary has been reviewed and is understood.  Full responsibility of the confidentiality of this discharge information lies with you and/or your care-partner.

## 2016-03-06 ENCOUNTER — Telehealth: Payer: Self-pay

## 2016-03-06 ENCOUNTER — Telehealth: Payer: Self-pay | Admitting: *Deleted

## 2016-03-06 NOTE — Telephone Encounter (Signed)
  Follow up Call-  Call back number 03/05/2016  Post procedure Call Back phone  # [phone]  Permission to leave phone message Yes  Some recent data might be hidden     Patient questions:  Do you have a fever, pain , or abdominal swelling? No. Pain Score  0 *  Have you tolerated food without any problems? Yes.    Have you been able to return to your normal activities? Yes.    Do you have any questions about your discharge instructions: Diet   No. Medications  No. Follow up visit  No.  Do you have questions or concerns about your Care? No.  Actions: * If pain score is 4 or above: No action needed, pain <4.

## 2016-03-06 NOTE — Telephone Encounter (Signed)
NO ANSWER.  LEFT MESSAGE TO CALL IF QUESTIONS OR CONCERNS.

## 2016-03-29 ENCOUNTER — Other Ambulatory Visit (INDEPENDENT_AMBULATORY_CARE_PROVIDER_SITE_OTHER): Payer: Self-pay | Admitting: Orthopaedic Surgery

## 2016-04-02 NOTE — Telephone Encounter (Signed)
Please advise 

## 2016-04-26 ENCOUNTER — Other Ambulatory Visit: Payer: Self-pay | Admitting: Gastroenterology

## 2016-04-26 DIAGNOSIS — K5900 Constipation, unspecified: Secondary | ICD-10-CM

## 2016-07-02 ENCOUNTER — Other Ambulatory Visit (INDEPENDENT_AMBULATORY_CARE_PROVIDER_SITE_OTHER): Payer: Self-pay | Admitting: Orthopaedic Surgery

## 2016-10-06 DIAGNOSIS — R5381 Other malaise: Secondary | ICD-10-CM | POA: Insufficient documentation

## 2016-12-21 ENCOUNTER — Other Ambulatory Visit (INDEPENDENT_AMBULATORY_CARE_PROVIDER_SITE_OTHER): Payer: Self-pay | Admitting: Orthopaedic Surgery

## 2017-01-26 DIAGNOSIS — R3121 Asymptomatic microscopic hematuria: Secondary | ICD-10-CM | POA: Insufficient documentation

## 2017-01-26 DIAGNOSIS — K5900 Constipation, unspecified: Secondary | ICD-10-CM | POA: Insufficient documentation

## 2017-04-12 ENCOUNTER — Other Ambulatory Visit (INDEPENDENT_AMBULATORY_CARE_PROVIDER_SITE_OTHER): Payer: Self-pay | Admitting: Orthopaedic Surgery

## 2017-08-03 ENCOUNTER — Other Ambulatory Visit (INDEPENDENT_AMBULATORY_CARE_PROVIDER_SITE_OTHER): Payer: Self-pay | Admitting: Orthopaedic Surgery

## 2017-11-24 ENCOUNTER — Other Ambulatory Visit (INDEPENDENT_AMBULATORY_CARE_PROVIDER_SITE_OTHER): Payer: Self-pay | Admitting: Orthopaedic Surgery

## 2018-03-08 ENCOUNTER — Other Ambulatory Visit: Payer: Self-pay | Admitting: Internal Medicine

## 2018-03-08 DIAGNOSIS — Z720 Tobacco use: Secondary | ICD-10-CM

## 2018-03-08 DIAGNOSIS — R945 Abnormal results of liver function studies: Secondary | ICD-10-CM | POA: Insufficient documentation

## 2018-03-09 ENCOUNTER — Other Ambulatory Visit: Payer: Self-pay | Admitting: Internal Medicine

## 2018-03-09 DIAGNOSIS — Z720 Tobacco use: Secondary | ICD-10-CM

## 2018-03-15 ENCOUNTER — Other Ambulatory Visit (INDEPENDENT_AMBULATORY_CARE_PROVIDER_SITE_OTHER): Payer: Self-pay | Admitting: Orthopaedic Surgery

## 2018-03-15 ENCOUNTER — Ambulatory Visit
Admission: RE | Admit: 2018-03-15 | Discharge: 2018-03-15 | Disposition: A | Discharge status: Home / Self Care | Payer: Medicare Other | Source: Ambulatory Visit | Attending: Internal Medicine | Admitting: Internal Medicine

## 2018-03-15 DIAGNOSIS — Z720 Tobacco use: Secondary | ICD-10-CM

## 2018-03-16 DIAGNOSIS — J449 Chronic obstructive pulmonary disease, unspecified: Secondary | ICD-10-CM | POA: Insufficient documentation

## 2018-03-16 DIAGNOSIS — I700 Atherosclerosis of aorta: Secondary | ICD-10-CM | POA: Insufficient documentation

## 2018-07-04 ENCOUNTER — Other Ambulatory Visit (INDEPENDENT_AMBULATORY_CARE_PROVIDER_SITE_OTHER): Payer: Self-pay | Admitting: Orthopaedic Surgery

## 2018-11-03 ENCOUNTER — Other Ambulatory Visit (INDEPENDENT_AMBULATORY_CARE_PROVIDER_SITE_OTHER): Payer: Self-pay | Admitting: Orthopaedic Surgery

## 2019-02-24 ENCOUNTER — Other Ambulatory Visit (INDEPENDENT_AMBULATORY_CARE_PROVIDER_SITE_OTHER): Payer: Self-pay | Admitting: Orthopaedic Surgery

## 2019-06-23 ENCOUNTER — Other Ambulatory Visit (INDEPENDENT_AMBULATORY_CARE_PROVIDER_SITE_OTHER): Payer: Self-pay | Admitting: Orthopaedic Surgery

## 2019-07-02 ENCOUNTER — Ambulatory Visit: Payer: BC Managed Care – PPO

## 2019-07-07 ENCOUNTER — Ambulatory Visit: Payer: BC Managed Care – PPO

## 2019-07-27 ENCOUNTER — Ambulatory Visit: Payer: Medicare Other | Attending: Internal Medicine

## 2019-07-27 DIAGNOSIS — Z23 Encounter for immunization: Secondary | ICD-10-CM | POA: Insufficient documentation

## 2019-07-27 NOTE — Progress Notes (Signed)
   Covid-19 Vaccination Clinic  Name:  Holly Valenzuela    MRN: [id number] DOB: 1940-06-29  07/27/2019  [name] was observed post Covid-19 immunization for 15 minutes without incidence. She was provided with Vaccine Information Sheet and instruction to access the V-Safe system.   [name] was instructed to call 911 with any severe reactions post vaccine: Marland Kitchen Difficulty breathing  . Swelling of your face and throat  . A fast heartbeat  . A bad rash all over your body  . Dizziness and weakness    Immunizations Administered    Name Date Dose VIS Date Route   Pfizer COVID-19 Vaccine 07/27/2019 10:41 AM 0.3 mL 05/12/2019 Intramuscular   Manufacturer: [place]   Lot: [id number]   [place]: [id number]

## 2019-08-16 ENCOUNTER — Ambulatory Visit: Payer: Medicare Other | Attending: Internal Medicine

## 2019-08-16 DIAGNOSIS — Z23 Encounter for immunization: Secondary | ICD-10-CM

## 2019-08-16 NOTE — Progress Notes (Signed)
   Covid-19 Vaccination Clinic  Name:  DEEPTI HANDCOCK    MRN: [id number] DOB: 1941/01/22  08/16/2019  Ms. Prettyman was observed post Covid-19 immunization for 15 minutes without incident. She was provided with Vaccine Information Sheet and instruction to access the V-Safe system.   Ms. Czapla was instructed to call 911 with any severe reactions post vaccine: Marland Kitchen Difficulty breathing  . Swelling of face and throat  . A fast heartbeat  . A bad rash all over body  . Dizziness and weakness   Immunizations Administered    Name Date Dose VIS Date Route   Pfizer COVID-19 Vaccine 08/16/2019 11:58 AM 0.3 mL 05/12/2019 Intramuscular   Manufacturer: [place]   Lot: [id number]   [place]: [id number]

## 2019-10-15 ENCOUNTER — Other Ambulatory Visit (INDEPENDENT_AMBULATORY_CARE_PROVIDER_SITE_OTHER): Payer: Self-pay | Admitting: Orthopaedic Surgery

## 2020-02-08 ENCOUNTER — Other Ambulatory Visit (INDEPENDENT_AMBULATORY_CARE_PROVIDER_SITE_OTHER): Payer: Self-pay | Admitting: Orthopaedic Surgery

## 2020-02-28 ENCOUNTER — Encounter (HOSPITAL_COMMUNITY): Payer: Self-pay

## 2020-02-28 ENCOUNTER — Emergency Department (HOSPITAL_COMMUNITY): Payer: Medicare Other

## 2020-02-28 ENCOUNTER — Inpatient Hospital Stay (HOSPITAL_COMMUNITY)
Admission: EM | Admit: 2020-02-28 | Discharge: 2020-03-04 | DRG: 641 | Disposition: A | Discharge status: Skilled Nursing Facility | Payer: Medicare Other | Attending: Internal Medicine | Admitting: Internal Medicine

## 2020-02-28 DIAGNOSIS — J449 Chronic obstructive pulmonary disease, unspecified: Secondary | ICD-10-CM | POA: Diagnosis present

## 2020-02-28 DIAGNOSIS — Z79899 Other long term (current) drug therapy: Secondary | ICD-10-CM

## 2020-02-28 DIAGNOSIS — N390 Urinary tract infection, site not specified: Secondary | ICD-10-CM | POA: Diagnosis present

## 2020-02-28 DIAGNOSIS — Z20822 Contact with and (suspected) exposure to covid-19: Secondary | ICD-10-CM | POA: Diagnosis present

## 2020-02-28 DIAGNOSIS — S0990XA Unspecified injury of head, initial encounter: Secondary | ICD-10-CM | POA: Diagnosis present

## 2020-02-28 DIAGNOSIS — I10 Essential (primary) hypertension: Secondary | ICD-10-CM | POA: Diagnosis not present

## 2020-02-28 DIAGNOSIS — E876 Hypokalemia: Secondary | ICD-10-CM | POA: Diagnosis not present

## 2020-02-28 DIAGNOSIS — R55 Syncope and collapse: Secondary | ICD-10-CM | POA: Diagnosis present

## 2020-02-28 DIAGNOSIS — F17210 Nicotine dependence, cigarettes, uncomplicated: Secondary | ICD-10-CM | POA: Diagnosis present

## 2020-02-28 DIAGNOSIS — Z23 Encounter for immunization: Secondary | ICD-10-CM

## 2020-02-28 DIAGNOSIS — R627 Adult failure to thrive: Secondary | ICD-10-CM | POA: Diagnosis present

## 2020-02-28 DIAGNOSIS — E871 Hypo-osmolality and hyponatremia: Secondary | ICD-10-CM | POA: Diagnosis not present

## 2020-02-28 DIAGNOSIS — Z681 Body mass index (BMI) 19 or less, adult: Secondary | ICD-10-CM

## 2020-02-28 DIAGNOSIS — R636 Underweight: Secondary | ICD-10-CM | POA: Diagnosis present

## 2020-02-28 DIAGNOSIS — W19XXXA Unspecified fall, initial encounter: Secondary | ICD-10-CM | POA: Diagnosis present

## 2020-02-28 DIAGNOSIS — Y92009 Unspecified place in unspecified non-institutional (private) residence as the place of occurrence of the external cause: Secondary | ICD-10-CM

## 2020-02-28 DIAGNOSIS — S0101XA Laceration without foreign body of scalp, initial encounter: Secondary | ICD-10-CM | POA: Diagnosis present

## 2020-02-28 DIAGNOSIS — K5909 Other constipation: Secondary | ICD-10-CM | POA: Diagnosis present

## 2020-02-28 DIAGNOSIS — I951 Orthostatic hypotension: Secondary | ICD-10-CM | POA: Diagnosis present

## 2020-02-28 DIAGNOSIS — Z87440 Personal history of urinary (tract) infections: Secondary | ICD-10-CM

## 2020-02-28 DIAGNOSIS — G43909 Migraine, unspecified, not intractable, without status migrainosus: Secondary | ICD-10-CM | POA: Diagnosis present

## 2020-02-28 DIAGNOSIS — I7389 Other specified peripheral vascular diseases: Secondary | ICD-10-CM | POA: Diagnosis present

## 2020-02-28 DIAGNOSIS — E785 Hyperlipidemia, unspecified: Secondary | ICD-10-CM | POA: Diagnosis present

## 2020-02-28 DIAGNOSIS — E860 Dehydration: Secondary | ICD-10-CM | POA: Diagnosis present

## 2020-02-28 HISTORY — DX: Hyperlipidemia, unspecified: E78.5

## 2020-02-28 HISTORY — DX: Migraine, unspecified, not intractable, without status migrainosus: G43.909

## 2020-02-28 HISTORY — DX: Chronic obstructive pulmonary disease, unspecified: J44.9

## 2020-02-28 LAB — CBC WITH DIFFERENTIAL/PLATELET
Abs Immature Granulocytes: 0.25 10*3/uL — ABNORMAL HIGH (ref 0.00–0.07)
Basophils Absolute: 0.1 10*3/uL (ref 0.0–0.1)
Basophils Relative: 0 %
Eosinophils Absolute: 0 10*3/uL (ref 0.0–0.5)
Eosinophils Relative: 0 %
HCT: 44.4 % (ref 36.0–46.0)
Hemoglobin: 15.1 g/dL — ABNORMAL HIGH (ref 12.0–15.0)
Immature Granulocytes: 1 %
Lymphocytes Relative: 6 %
Lymphs Abs: 1.1 10*3/uL (ref 0.7–4.0)
MCH: 32.9 pg (ref 26.0–34.0)
MCHC: 34 g/dL (ref 30.0–36.0)
MCV: 96.7 fL (ref 80.0–100.0)
Monocytes Absolute: 0.8 10*3/uL (ref 0.1–1.0)
Monocytes Relative: 4 %
Neutro Abs: 17.4 10*3/uL — ABNORMAL HIGH (ref 1.7–7.7)
Neutrophils Relative %: 89 %
Platelets: 299 10*3/uL (ref 150–400)
RBC: 4.59 MIL/uL (ref 3.87–5.11)
RDW: 13.8 % (ref 11.5–15.5)
WBC: 19.7 10*3/uL — ABNORMAL HIGH (ref 4.0–10.5)
nRBC: 0 % (ref 0.0–0.2)

## 2020-02-28 LAB — URINALYSIS, ROUTINE W REFLEX MICROSCOPIC
Bilirubin Urine: NEGATIVE
Glucose, UA: NEGATIVE mg/dL
Ketones, ur: NEGATIVE mg/dL
Nitrite: POSITIVE — AB
Protein, ur: NEGATIVE mg/dL
Specific Gravity, Urine: 1.011 (ref 1.005–1.030)
pH: 7 (ref 5.0–8.0)

## 2020-02-28 LAB — RESPIRATORY PANEL BY RT PCR (FLU A&B, COVID)
Influenza A by PCR: NEGATIVE
Influenza B by PCR: NEGATIVE
SARS Coronavirus 2 by RT PCR: NEGATIVE

## 2020-02-28 LAB — COMPREHENSIVE METABOLIC PANEL
ALT: 22 U/L (ref 0–44)
AST: 28 U/L (ref 15–41)
Albumin: 3.9 g/dL (ref 3.5–5.0)
Alkaline Phosphatase: 116 U/L (ref 38–126)
Anion gap: 14 (ref 5–15)
BUN: 10 mg/dL (ref 8–23)
CO2: 27 mmol/L (ref 22–32)
Calcium: 8.8 mg/dL — ABNORMAL LOW (ref 8.9–10.3)
Chloride: 88 mmol/L — ABNORMAL LOW (ref 98–111)
Creatinine, Ser: 0.91 mg/dL (ref 0.44–1.00)
GFR calc Af Amer: >60 mL/min (ref >60)
GFR calc non Af Amer: 60 mL/min — ABNORMAL LOW (ref >60)
Glucose, Bld: 131 mg/dL — ABNORMAL HIGH (ref 70–99)
Potassium: 4 mmol/L (ref 3.5–5.1)
Sodium: 129 mmol/L — ABNORMAL LOW (ref 135–145)
Total Bilirubin: 0.6 mg/dL (ref 0.3–1.2)
Total Protein: 6.6 g/dL (ref 6.5–8.1)

## 2020-02-28 LAB — PROTIME-INR
INR: 0.9 (ref 0.8–1.2)
Prothrombin Time: 11.7 seconds (ref 11.4–15.2)

## 2020-02-28 LAB — CK: Total CK: 94 U/L (ref 38–234)

## 2020-02-28 MED ORDER — ALPRAZOLAM 0.25 MG PO TABS
0.2500 mg | ORAL_TABLET | Freq: Three times a day (TID) | ORAL | Status: DC | PRN
  Administered 2020-02-29 – 2020-03-02 (×3): 0.25 mg via ORAL
  Filled 2020-02-28 (×3): qty 1

## 2020-02-28 MED ORDER — LOSARTAN POTASSIUM 50 MG PO TABS
50.0000 mg | ORAL_TABLET | Freq: Every day | ORAL | Status: DC
  Administered 2020-02-29 – 2020-03-04 (×5): 50 mg via ORAL
  Filled 2020-02-28 (×5): qty 1

## 2020-02-28 MED ORDER — SODIUM CHLORIDE 0.9 % IV SOLN: INTRAVENOUS | Status: DC

## 2020-02-28 MED ORDER — ENOXAPARIN SODIUM 40 MG/0.4ML ~~LOC~~ SOLN
40.0000 mg | Freq: Every day | SUBCUTANEOUS | Status: DC
  Administered 2020-02-29: 40 mg via SUBCUTANEOUS
  Filled 2020-02-28: qty 0.4

## 2020-02-28 MED ORDER — MIRTAZAPINE 15 MG PO TABS
7.5000 mg | ORAL_TABLET | Freq: Every day | ORAL | Status: DC
  Administered 2020-02-29 – 2020-03-03 (×5): 7.5 mg via ORAL
  Filled 2020-02-28 (×5): qty 1

## 2020-02-28 MED ORDER — ONDANSETRON HCL 4 MG/2ML IJ SOLN
4.0000 mg | Freq: Four times a day (QID) | INTRAMUSCULAR | Status: DC | PRN
  Administered 2020-02-29 (×2): 4 mg via INTRAVENOUS
  Filled 2020-02-28 (×2): qty 2

## 2020-02-28 MED ORDER — ONDANSETRON HCL 4 MG PO TABS: 4.0000 mg | ORAL_TABLET | Freq: Four times a day (QID) | ORAL | Status: DC | PRN

## 2020-02-28 MED ORDER — ATORVASTATIN CALCIUM 10 MG PO TABS
10.0000 mg | ORAL_TABLET | Freq: Every day | ORAL | Status: DC
  Administered 2020-02-29 – 2020-03-04 (×5): 10 mg via ORAL
  Filled 2020-02-28 (×5): qty 1

## 2020-02-28 NOTE — ED Provider Notes (Signed)
[facility] EMERGENCY DEPARTMENT Provider Note   CSN: [id number] Arrival date & time: 02/28/20  1951     History Chief Complaint  Patient presents with  . Fall    Holly Valenzuela is a 79 y.o. female.  Patient with a fall but has no memory of the fall so it could have been a syncopal episode.  Patient brought in by EMS.  The fall occurred at home it was outside she was coming from the store.  May have been outside for approximately 2 hours.  Patient was able to get inside and call 911.  However patient does not seem to remember this.  She thinks it was her brother-in-law they called 911.  So she does not have real good recall of the events.  Certainly does not remember falling.  She had a hematoma with some bleeding to the back of her head.  Complaining of nausea.  EMS gave her 4 mg of Zofran.  Patient had no complaint of any neck pain did complain of some head pain on the left occipital area.  Patient with bleeding from the scalp bleeding controlled.  Patient not on blood thinners.        Past Medical History:  Diagnosis Date  . Hypertension   . Migraines     There are no problems to display for this patient.   History reviewed. No pertinent surgical history.   OB History   No obstetric history on file.     No family history on file.  Social History   Tobacco Use  . Smoking status: Not on file  Substance Use Topics  . Alcohol use: Not on file  . Drug use: Not on file    Home Medications Prior to Admission medications   Not on File    Allergies    Patient has no known allergies.  Review of Systems   Review of Systems  Constitutional: Negative for chills and fever.  HENT: Negative for congestion, rhinorrhea and sore throat.   Eyes: Negative for visual disturbance.  Respiratory: Negative for cough and shortness of breath.   Cardiovascular: Negative for chest pain and leg swelling.  Gastrointestinal: Negative for abdominal pain,  diarrhea, nausea and vomiting.  Genitourinary: Negative for dysuria.  Musculoskeletal: Negative for back pain and neck pain.  Skin: Positive for wound. Negative for rash.  Neurological: Negative for dizziness, light-headedness and headaches.  Hematological: Does not bruise/bleed easily.  Psychiatric/Behavioral: Positive for confusion.    Physical Exam Updated Vital Signs BP 132/88 Comment: manual   Pulse 80   Temp (!) 97 F (36.1 C)   Resp 13   Ht 1.651 m (5\' 5" )   Wt 38.6 kg   SpO2 94%   BMI 14.14 kg/m   Physical Exam Vitals and nursing note reviewed.  Constitutional:      General: She is not in acute distress.    Appearance: Normal appearance. She is well-developed. She is not ill-appearing.  HENT:     Head: Normocephalic.     Comments: Patient's left occiput with an abrasion little bit of hematoma and a very small stellate laceration about 2 to 3 mm in 3 different directions.  No active bleeding. Eyes:     Extraocular Movements: Extraocular movements intact.     Conjunctiva/sclera: Conjunctivae normal.     Pupils: Pupils are equal, round, and reactive to light.  Cardiovascular:     Rate and Rhythm: Normal rate and regular rhythm.     Heart  sounds: No murmur heard.   Pulmonary:     Effort: Pulmonary effort is normal. No respiratory distress.     Breath sounds: Normal breath sounds.  Abdominal:     Palpations: Abdomen is soft.     Tenderness: There is no abdominal tenderness.  Musculoskeletal:        General: No tenderness or signs of injury. Normal range of motion.     Cervical back: Neck supple.     Comments: Excellent range of motion of both lower extremities no tenderness to the hip or pelvis area.  Upper extremities without any tenderness no posterior thoracic or lumbar tenderness.  Skin:    General: Skin is warm and dry.  Neurological:     General: No focal deficit present.     Mental Status: She is alert. She is disoriented.     Cranial Nerves: No cranial  nerve deficit.     Sensory: No sensory deficit.     Motor: No weakness.     Comments: Patient with some memory deficits observed rounding the events of the fall.  Otherwise no focal deficit.     ED Results / Procedures / Treatments   Labs (all labs ordered are listed, but only abnormal results are displayed) Labs Reviewed  COMPREHENSIVE METABOLIC PANEL - Abnormal; Notable for the following components:      Result Value   Sodium 129 (*)    Chloride 88 (*)    Glucose, Bld 131 (*)    Calcium 8.8 (*)    GFR calc non Af Amer 60 (*)    All other components within normal limits  CBC WITH DIFFERENTIAL/PLATELET - Abnormal; Notable for the following components:   WBC 19.7 (*)    Hemoglobin 15.1 (*)    Neutro Abs 17.4 (*)    Abs Immature Granulocytes 0.25 (*)    All other components within normal limits  RESPIRATORY PANEL BY RT PCR (FLU A&B, COVID)  PROTIME-INR  CK  URINALYSIS, ROUTINE W REFLEX MICROSCOPIC    EKG None   ED ECG REPORT   Date: 02/28/2020  Rate: 84  Rhythm: normal sinus rhythm  QRS Axis: normal  Intervals: normal  ST/T Wave abnormalities: nonspecific ST/T changes  Conduction Disutrbances:none  Narrative Interpretation:   Old EKG Reviewed: none available Significant artifact in all leads.  Making interpretation difficult.  Will need repeat. I have personally reviewed the EKG tracing and agree with the computerized printout as noted.   Radiology CT Head Wo Contrast  Result Date: 02/28/2020 CLINICAL DATA:  Fall EXAM: CT HEAD WITHOUT CONTRAST CT CERVICAL SPINE WITHOUT CONTRAST TECHNIQUE: Multidetector CT imaging of the head and cervical spine was performed following the standard protocol without intravenous contrast. Multiplanar CT image reconstructions of the cervical spine were also generated. COMPARISON:  None. FINDINGS: CT HEAD FINDINGS Brain: There is no mass, hemorrhage or extra-axial collection. There is generalized atrophy without lobar predilection. There  is hypoattenuation of the periventricular white matter, most commonly indicating chronic ischemic microangiopathy. Vascular: No abnormal hyperdensity of the major intracranial arteries or dural venous sinuses. No intracranial atherosclerosis. Skull: The visualized skull base, calvarium and extracranial soft tissues are normal. Sinuses/Orbits: No fluid levels or advanced mucosal thickening of the visualized paranasal sinuses. No mastoid or middle ear effusion. The orbits are normal. CT CERVICAL SPINE FINDINGS Alignment: Reversal of normal cervical lordosis centered at C5. Grade 1 anterolisthesis at C4-5. Grade 1 retrolisthesis at C5-6. Skull base and vertebrae: No acute fracture. Soft tissues and spinal canal: No  prevertebral fluid or swelling. No visible canal hematoma. Disc levels: No advanced spinal canal or neural foraminal stenosis. Upper chest: No pneumothorax, pulmonary nodule or pleural effusion. Other: Normal visualized paraspinal cervical soft tissues. IMPRESSION: 1. Chronic ischemic microangiopathy and generalized volume loss without acute intracranial abnormality. 2. No acute fracture or static subluxation of the cervical spine. Electronically Signed   By: Ulyses Jarred M.D.   On: 02/28/2020 20:51   CT Cervical Spine Wo Contrast  Result Date: 02/28/2020 CLINICAL DATA:  Fall EXAM: CT HEAD WITHOUT CONTRAST CT CERVICAL SPINE WITHOUT CONTRAST TECHNIQUE: Multidetector CT imaging of the head and cervical spine was performed following the standard protocol without intravenous contrast. Multiplanar CT image reconstructions of the cervical spine were also generated. COMPARISON:  None. FINDINGS: CT HEAD FINDINGS Brain: There is no mass, hemorrhage or extra-axial collection. There is generalized atrophy without lobar predilection. There is hypoattenuation of the periventricular white matter, most commonly indicating chronic ischemic microangiopathy. Vascular: No abnormal hyperdensity of the major intracranial  arteries or dural venous sinuses. No intracranial atherosclerosis. Skull: The visualized skull base, calvarium and extracranial soft tissues are normal. Sinuses/Orbits: No fluid levels or advanced mucosal thickening of the visualized paranasal sinuses. No mastoid or middle ear effusion. The orbits are normal. CT CERVICAL SPINE FINDINGS Alignment: Reversal of normal cervical lordosis centered at C5. Grade 1 anterolisthesis at C4-5. Grade 1 retrolisthesis at C5-6. Skull base and vertebrae: No acute fracture. Soft tissues and spinal canal: No prevertebral fluid or swelling. No visible canal hematoma. Disc levels: No advanced spinal canal or neural foraminal stenosis. Upper chest: No pneumothorax, pulmonary nodule or pleural effusion. Other: Normal visualized paraspinal cervical soft tissues. IMPRESSION: 1. Chronic ischemic microangiopathy and generalized volume loss without acute intracranial abnormality. 2. No acute fracture or static subluxation of the cervical spine. Electronically Signed   By: Ulyses Jarred M.D.   On: 02/28/2020 20:51   DG Chest Port 1 View  Result Date: 02/28/2020 CLINICAL DATA:  Fall.  Syncope. EXAM: PORTABLE CHEST 1 VIEW COMPARISON:  04/17/2014 chest radiograph, 03/15/2018 chest CT FINDINGS: The lungs are hyperinflated with diffuse interstitial prominence. No focal airspace consolidation or pulmonary edema. No pleural effusion or pneumothorax. Normal cardiomediastinal contours. IMPRESSION: COPD without acute airspace disease. Electronically Signed   By: Ulyses Jarred M.D.   On: 02/28/2020 20:35    Procedures Procedures (including critical care time)  CRITICAL CARE Performed by: Fredia Sorrow Total critical care time: 35 minutes Critical care time was exclusive of separately billable procedures and treating other patients. Critical care was necessary to treat or prevent imminent or life-threatening deterioration. Critical care was time spent personally by me on the following  activities: development of treatment plan with patient and/or surrogate as well as nursing, discussions with consultants, evaluation of patient's response to treatment, examination of patient, obtaining history from patient or surrogate, ordering and performing treatments and interventions, ordering and review of laboratory studies, ordering and review of radiographic studies, pulse oximetry and re-evaluation of patient's condition.   Medications Ordered in ED Medications  0.9 %  sodium chloride infusion ( Intravenous New Bag/Given 02/28/20 2010)    ED Course  I have reviewed the triage vital signs and the nursing notes.  Pertinent labs & imaging results that were available during my care of the patient were reviewed by me and considered in my medical decision making (see chart for details).    MDM Rules/Calculators/A&P  Patient awake following commands upon arrival but did not have very good memory of falling tripping or anything like that.  Could be postconcussive.  The concern would be that there could have been a syncopal episode.  Patient was on the ground for approximately 2 hours.  In addition EMS thought that patient had called 911 but patient's thought it was her brother-in-law.  So there should be could be some confusion on that.  Covid testing negative.  Electrolytes significant for some mild hyponatremia.  Sodium of 129.  Also patient white blood cell count was elevated at 19,000 but chest x-ray negative no fevers here.  Covid testing negative.  Urinalysis pending.  Patient states she is fairly certain she is up-to-date on her tetanus because Dr. Haynes Kerns is her primary care doctor anyways keeps her up-to-date.  Patient with a small contusion abrasion very superficial laceration to the scalp on the left occiput area.  No active bleeding.  Laceration is measured just in millimeters.  In a stellate direction.  Does not require any stapling.  CT head neck and  chest x-ray without any acute findings.  Patient patient CK is not elevated.  Discussed with hospitalist regarding possible syncope also could be postconcussive syndrome.  They will admit for cardiac monitoring.  Final Clinical Impression(s) / ED Diagnoses Final diagnoses:  Syncope, unspecified syncope type  Injury of head, initial encounter  Hyponatremia    Rx / DC Orders ED Discharge Orders    None       Fredia Sorrow, MD 02/28/20 2144

## 2020-02-28 NOTE — H&P (Addendum)
History and Physical    Holly Valenzuela HGD:[id number] DOB: 26-Nov-1940 DOA: 02/28/2020  PCP: Pcp, No  Patient coming from: Home.  Chief Complaint: Loss of consciousness.  HPI: Holly Valenzuela is a 79 y.o. female with history of hypertension, hyperlipidemia, ongoing tobacco abuse, COPD was brought to the ER after patient had an episode of loss of consciousness.  Patient states she was back home from shopping at a store when she fell outside her house unconscious.  Patient states she was not having any dizziness shortness of breath or chest pain prior to the episode.  Patient states she may have been on the floor for at least 2 hours after which he regained consciousness and called her family and was brought to the ER.  Patient states her blood pressure medicines dose was recently increased.  ED Course: In the ER patient appeared nonfocal CT head and C-spine was unremarkable labs were significant for sodium of 129 WBC was 19.7 EKG was showing normal sinus rhythm with QTC of 486 ms.  Patient was started on gentle hydration and admitted for further observation.  Chest x-ray unremarkable Covid test was negative.  Review of Systems: As per HPI, rest all negative.   Past Medical History:  Diagnosis Date  . Hypertension   . Migraines     Past Surgical History:  Procedure Laterality Date  . APPENDECTOMY    . CESAREAN SECTION       reports that she has been smoking. She has never used smokeless tobacco. She reports that she does not drink alcohol. No history on file for drug use.  No Known Allergies  Family History  Family history unknown: Yes    Prior to Admission medications   Medication Sig Start Date End Date Taking? Authorizing Provider  ALPRAZolam Duanne Moron) 0.5 MG tablet Take 0.25-0.5 mg by mouth 3 (three) times daily as needed for anxiety.  02/11/20  Yes [provider]  atorvastatin (LIPITOR) 10 MG tablet Take 10 mg by mouth daily. 01/03/20  Yes [provider]  butalbital-acetaminophen-caffeine (FIORICET) 50-325-40 MG tablet Take 1 tablet by mouth 4 (four) times daily as needed for migraine.  02/11/20  Yes [provider]  losartan (COZAAR) 50 MG tablet Take 50 mg by mouth daily. 02/21/20  Yes [provider]  meclizine (ANTIVERT) 12.5 MG tablet Take 12.5 mg by mouth every 6 (six) hours as needed for dizziness.  02/11/20  Yes [provider]  mirtazapine (REMERON) 15 MG tablet Take 7.5 mg by mouth at bedtime.  01/03/20  Yes [provider]    Physical Exam: Constitutional: Moderately built and nourished. Vitals:   02/28/20 1956 02/28/20 1958 02/28/20 2000 02/28/20 2100  BP:      Pulse:   84 80  Resp:   20 13  Temp: (!) 97 F (36.1 C)     SpO2: 98%   94%  Weight:  38.6 kg    Height:  5\' 5"  (1.651 m)     Eyes: Anicteric no pallor. ENMT: No discharge from the ears eyes nose or mouth.  Small scalp hematoma. Neck: No neck rigidity. Respiratory: No rhonchi or crepitations. Cardiovascular: S1-S2 heard. Abdomen: Soft nontender bowel sounds present. Musculoskeletal: No edema. Skin: Small scalp hematoma. Neurologic: Alert awake oriented to time place and person.  Moves all extremities. Psychiatric: Appears normal.  Normal affect.   Labs on Admission: I have personally reviewed following labs and imaging studies  CBC: Recent Labs  Lab 02/28/20 2001  WBC  19.7*  NEUTROABS 17.4*  HGB 15.1*  HCT 44.4  MCV 96.7  PLT 338   Basic Metabolic Panel: Recent Labs  Lab 02/28/20 2001  NA 129*  K 4.0  CL 88*  CO2 27  GLUCOSE 131*  BUN 10  CREATININE 0.91  CALCIUM 8.8*   GFR: Estimated Creatinine Clearance: 30 mL/min (by C-G formula based on SCr of 0.91 mg/dL). Liver Function Tests: Recent Labs  Lab 02/28/20 2001  AST 28  ALT 22  ALKPHOS 116  BILITOT 0.6  PROT 6.6  ALBUMIN 3.9   No results for input(s): LIPASE, AMYLASE in the last 168 hours. No results for input(s): AMMONIA in the  last 168 hours. Coagulation Profile: Recent Labs  Lab 02/28/20 2001  INR 0.9   Cardiac Enzymes: Recent Labs  Lab 02/28/20 2001  CKTOTAL 94   BNP (last 3 results) No results for input(s): PROBNP in the last 8760 hours. HbA1C: No results for input(s): HGBA1C in the last 72 hours. CBG: No results for input(s): GLUCAP in the last 168 hours. Lipid Profile: No results for input(s): CHOL, HDL, LDLCALC, TRIG, CHOLHDL, LDLDIRECT in the last 72 hours. Thyroid Function Tests: No results for input(s): TSH, T4TOTAL, FREET4, T3FREE, THYROIDAB in the last 72 hours. Anemia Panel: No results for input(s): VITAMINB12, FOLATE, FERRITIN, TIBC, IRON, RETICCTPCT in the last 72 hours. Urine analysis:    Component Value Date/Time   COLORURINE YELLOW 02/28/2020 2129   APPEARANCEUR HAZY (A) 02/28/2020 2129   LABSPEC 1.011 02/28/2020 2129   PHURINE 7.0 02/28/2020 2129   GLUCOSEU NEGATIVE 02/28/2020 2129   HGBUR SMALL (A) 02/28/2020 2129   BILIRUBINUR NEGATIVE 02/28/2020 2129   KETONESUR NEGATIVE 02/28/2020 2129   PROTEINUR NEGATIVE 02/28/2020 2129   NITRITE POSITIVE (A) 02/28/2020 2129   LEUKOCYTESUR MODERATE (A) 02/28/2020 2129   Sepsis Labs: @LABRCNTIP (procalcitonin:4,lacticidven:4) ) Recent Results (from the past 240 hour(s))  Respiratory Panel by RT PCR (Flu A&B, Covid) - Nasopharyngeal Swab     Status: None   Collection Time: 02/28/20  8:06 PM   Specimen: Nasopharyngeal Swab  Result Value Ref Range Status   SARS Coronavirus 2 by RT PCR NEGATIVE NEGATIVE Final    Comment: (NOTE) SARS-CoV-2 target nucleic acids are NOT DETECTED.  The SARS-CoV-2 RNA is generally detectable in upper respiratoy specimens during the acute phase of infection. The lowest concentration of SARS-CoV-2 viral copies this assay can detect is 131 copies/mL. A negative result does not preclude SARS-Cov-2 infection and should not be used as the sole basis for treatment or other patient management decisions. A  negative result may occur with  improper specimen collection/handling, submission of specimen other than nasopharyngeal swab, presence of viral mutation(s) within the areas targeted by this assay, and inadequate number of viral copies (<131 copies/mL). A negative result must be combined with clinical observations, patient history, and epidemiological information. The expected result is Negative.  Fact Sheet for Patients:  PinkCheek.be  Fact Sheet for Healthcare Providers:  GravelBags.it  This test is no t yet approved or cleared by the [place] FDA and  has been authorized for detection and/or diagnosis of SARS-CoV-2 by FDA under an Emergency Use Authorization (EUA). This EUA will remain  in effect (meaning this test can be used) for the duration of the COVID-19 declaration under Section 564(b)(1) of the Act, 21 U.S.C. section 360bbb-3(b)(1), unless the authorization is terminated or revoked sooner.     Influenza A by PCR NEGATIVE NEGATIVE Final   Influenza B by PCR NEGATIVE NEGATIVE  Final    Comment: (NOTE) The Xpert Xpress SARS-CoV-2/FLU/RSV assay is intended as an aid in  the diagnosis of influenza from Nasopharyngeal swab specimens and  should not be used as a sole basis for treatment. Nasal washings and  aspirates are unacceptable for Xpert Xpress SARS-CoV-2/FLU/RSV  testing.  Fact Sheet for Patients: PinkCheek.be  Fact Sheet for Healthcare Providers: GravelBags.it  This test is not yet approved or cleared by the [place] FDA and  has been authorized for detection and/or diagnosis of SARS-CoV-2 by  FDA under an Emergency Use Authorization (EUA). This EUA will remain  in effect (meaning this test can be used) for the duration of the  Covid-19 declaration under Section 564(b)(1) of the Act, 21  U.S.C. section 360bbb-3(b)(1), unless the authorization  is  terminated or revoked. Performed at [facility], [place] [address] [place], [place] [address]      Radiological Exams on Admission: CT Head Wo Contrast  Result Date: 02/28/2020 CLINICAL DATA:  Fall EXAM: CT HEAD WITHOUT CONTRAST CT CERVICAL SPINE WITHOUT CONTRAST TECHNIQUE: Multidetector CT imaging of the head and cervical spine was performed following the standard protocol without intravenous contrast. Multiplanar CT image reconstructions of the cervical spine were also generated. COMPARISON:  None. FINDINGS: CT HEAD FINDINGS Brain: There is no mass, hemorrhage or extra-axial collection. There is generalized atrophy without lobar predilection. There is hypoattenuation of the periventricular white matter, most commonly indicating chronic ischemic microangiopathy. Vascular: No abnormal hyperdensity of the major intracranial arteries or dural venous sinuses. No intracranial atherosclerosis. Skull: The visualized skull base, calvarium and extracranial soft tissues are normal. Sinuses/Orbits: No fluid levels or advanced mucosal thickening of the visualized paranasal sinuses. No mastoid or middle ear effusion. The orbits are normal. CT CERVICAL SPINE FINDINGS Alignment: Reversal of normal cervical lordosis centered at C5. Grade 1 anterolisthesis at C4-5. Grade 1 retrolisthesis at C5-6. Skull base and vertebrae: No acute fracture. Soft tissues and spinal canal: No prevertebral fluid or swelling. No visible canal hematoma. Disc levels: No advanced spinal canal or neural foraminal stenosis. Upper chest: No pneumothorax, pulmonary nodule or pleural effusion. Other: Normal visualized paraspinal cervical soft tissues. IMPRESSION: 1. Chronic ischemic microangiopathy and generalized volume loss without acute intracranial abnormality. 2. No acute fracture or static subluxation of the cervical spine. Electronically Signed   By: Ulyses Jarred M.D.   On: 02/28/2020 20:51   CT Cervical Spine Wo Contrast  Result  Date: 02/28/2020 CLINICAL DATA:  Fall EXAM: CT HEAD WITHOUT CONTRAST CT CERVICAL SPINE WITHOUT CONTRAST TECHNIQUE: Multidetector CT imaging of the head and cervical spine was performed following the standard protocol without intravenous contrast. Multiplanar CT image reconstructions of the cervical spine were also generated. COMPARISON:  None. FINDINGS: CT HEAD FINDINGS Brain: There is no mass, hemorrhage or extra-axial collection. There is generalized atrophy without lobar predilection. There is hypoattenuation of the periventricular white matter, most commonly indicating chronic ischemic microangiopathy. Vascular: No abnormal hyperdensity of the major intracranial arteries or dural venous sinuses. No intracranial atherosclerosis. Skull: The visualized skull base, calvarium and extracranial soft tissues are normal. Sinuses/Orbits: No fluid levels or advanced mucosal thickening of the visualized paranasal sinuses. No mastoid or middle ear effusion. The orbits are normal. CT CERVICAL SPINE FINDINGS Alignment: Reversal of normal cervical lordosis centered at C5. Grade 1 anterolisthesis at C4-5. Grade 1 retrolisthesis at C5-6. Skull base and vertebrae: No acute fracture. Soft tissues and spinal canal: No prevertebral fluid or swelling. No visible canal hematoma. Disc levels: No  advanced spinal canal or neural foraminal stenosis. Upper chest: No pneumothorax, pulmonary nodule or pleural effusion. Other: Normal visualized paraspinal cervical soft tissues. IMPRESSION: 1. Chronic ischemic microangiopathy and generalized volume loss without acute intracranial abnormality. 2. No acute fracture or static subluxation of the cervical spine. Electronically Signed   By: Ulyses Jarred M.D.   On: 02/28/2020 20:51   DG Chest Port 1 View  Result Date: 02/28/2020 CLINICAL DATA:  Fall.  Syncope. EXAM: PORTABLE CHEST 1 VIEW COMPARISON:  04/17/2014 chest radiograph, 03/15/2018 chest CT FINDINGS: The lungs are hyperinflated with  diffuse interstitial prominence. No focal airspace consolidation or pulmonary edema. No pleural effusion or pneumothorax. Normal cardiomediastinal contours. IMPRESSION: COPD without acute airspace disease. Electronically Signed   By: Ulyses Jarred M.D.   On: 02/28/2020 20:35    EKG: Independently reviewed.  Normal sinus rhythm.  Assessment/Plan Principal Problem:   Syncope Active Problems:   Essential hypertension    1. Syncope cause not clear.  No prodrome.  We will closely monitor in telemetry for any arrhythmias.  Check orthostatics.  Check 2D echo.  Consult cardiology. 2. Possible UTI on ceftriaxone check urine cultures. 3. Mild hyponatremia could be from dehydration.  Follow metabolic panel closely.  Gently hydrating at this time. 4. Hypertension on ARB. 5. Hyperlipidemia on statins. 6. COPD not actively wheezing. 7. Tobacco abuse advised about quitting. 8. Leukocytosis could be reactionary.  Patient is presently on ceftriaxone for UTI.   DVT prophylaxis: Lovenox. Code Status: Full code. Family Communication: Discussed with patient. Disposition Plan: Home. Consults called: Cardiology. Admission status: Observation.   Rise Patience MD [facility] Pager [phone].  If 7PM-7AM, please contact night-coverage [email or web address] Password [facility]  02/28/2020, 10:34 PM

## 2020-02-28 NOTE — Progress Notes (Signed)
Orthopedic Tech Progress Note Patient Details:  Daziah Hesler [facility] 06-27-1940 [id number] Level 2 Trauma  Patient ID: Holly Valenzuela, female   DOB: 25-Aug-1940, 79 y.o.   MRN: [id number]   Jearld Lesch 02/28/2020, 8:12 PM

## 2020-02-28 NOTE — ED Notes (Signed)
Pt comes via [facility] after a fall, from home, was coming from the store and fell outside her home, was outside for two hours before she was able to get inside and call 911, possible LOC, does not recall event, hematoma to back of head, bleeding controlled, c/o of nausea, PTA given 4mg  zofran.

## 2020-02-29 ENCOUNTER — Encounter (HOSPITAL_COMMUNITY): Payer: Self-pay | Admitting: Internal Medicine

## 2020-02-29 ENCOUNTER — Other Ambulatory Visit: Payer: Self-pay | Admitting: Physician Assistant

## 2020-02-29 ENCOUNTER — Observation Stay (HOSPITAL_BASED_OUTPATIENT_CLINIC_OR_DEPARTMENT_OTHER): Payer: Medicare Other

## 2020-02-29 ENCOUNTER — Other Ambulatory Visit: Payer: Self-pay

## 2020-02-29 DIAGNOSIS — S0101XA Laceration without foreign body of scalp, initial encounter: Secondary | ICD-10-CM | POA: Diagnosis present

## 2020-02-29 DIAGNOSIS — E860 Dehydration: Secondary | ICD-10-CM

## 2020-02-29 DIAGNOSIS — G43909 Migraine, unspecified, not intractable, without status migrainosus: Secondary | ICD-10-CM | POA: Diagnosis present

## 2020-02-29 DIAGNOSIS — E871 Hypo-osmolality and hyponatremia: Secondary | ICD-10-CM | POA: Diagnosis present

## 2020-02-29 DIAGNOSIS — I10 Essential (primary) hypertension: Secondary | ICD-10-CM

## 2020-02-29 DIAGNOSIS — S0990XA Unspecified injury of head, initial encounter: Secondary | ICD-10-CM | POA: Diagnosis present

## 2020-02-29 DIAGNOSIS — I5030 Unspecified diastolic (congestive) heart failure: Secondary | ICD-10-CM | POA: Diagnosis not present

## 2020-02-29 DIAGNOSIS — F17210 Nicotine dependence, cigarettes, uncomplicated: Secondary | ICD-10-CM | POA: Diagnosis present

## 2020-02-29 DIAGNOSIS — Y92009 Unspecified place in unspecified non-institutional (private) residence as the place of occurrence of the external cause: Secondary | ICD-10-CM | POA: Diagnosis not present

## 2020-02-29 DIAGNOSIS — R55 Syncope and collapse: Secondary | ICD-10-CM

## 2020-02-29 DIAGNOSIS — I951 Orthostatic hypotension: Secondary | ICD-10-CM

## 2020-02-29 DIAGNOSIS — K5909 Other constipation: Secondary | ICD-10-CM | POA: Diagnosis present

## 2020-02-29 DIAGNOSIS — Z23 Encounter for immunization: Secondary | ICD-10-CM | POA: Diagnosis present

## 2020-02-29 DIAGNOSIS — Z20822 Contact with and (suspected) exposure to covid-19: Secondary | ICD-10-CM | POA: Diagnosis present

## 2020-02-29 DIAGNOSIS — N390 Urinary tract infection, site not specified: Secondary | ICD-10-CM | POA: Diagnosis present

## 2020-02-29 DIAGNOSIS — R636 Underweight: Secondary | ICD-10-CM

## 2020-02-29 DIAGNOSIS — R627 Adult failure to thrive: Secondary | ICD-10-CM | POA: Diagnosis present

## 2020-02-29 DIAGNOSIS — Z87440 Personal history of urinary (tract) infections: Secondary | ICD-10-CM | POA: Diagnosis not present

## 2020-02-29 DIAGNOSIS — W19XXXA Unspecified fall, initial encounter: Secondary | ICD-10-CM | POA: Diagnosis present

## 2020-02-29 DIAGNOSIS — I7389 Other specified peripheral vascular diseases: Secondary | ICD-10-CM | POA: Diagnosis present

## 2020-02-29 DIAGNOSIS — E785 Hyperlipidemia, unspecified: Secondary | ICD-10-CM | POA: Diagnosis present

## 2020-02-29 DIAGNOSIS — Z681 Body mass index (BMI) 19 or less, adult: Secondary | ICD-10-CM | POA: Diagnosis not present

## 2020-02-29 DIAGNOSIS — E876 Hypokalemia: Secondary | ICD-10-CM | POA: Diagnosis not present

## 2020-02-29 DIAGNOSIS — J449 Chronic obstructive pulmonary disease, unspecified: Secondary | ICD-10-CM | POA: Diagnosis present

## 2020-02-29 DIAGNOSIS — Z79899 Other long term (current) drug therapy: Secondary | ICD-10-CM | POA: Diagnosis not present

## 2020-02-29 LAB — BASIC METABOLIC PANEL
Anion gap: 12 (ref 5–15)
BUN: 6 mg/dL — ABNORMAL LOW (ref 8–23)
CO2: 24 mmol/L (ref 22–32)
Calcium: 8.3 mg/dL — ABNORMAL LOW (ref 8.9–10.3)
Chloride: 96 mmol/L — ABNORMAL LOW (ref 98–111)
Creatinine, Ser: 0.71 mg/dL (ref 0.44–1.00)
GFR calc Af Amer: >60 mL/min (ref >60)
GFR calc non Af Amer: >60 mL/min (ref >60)
Glucose, Bld: 96 mg/dL (ref 70–99)
Potassium: 3.6 mmol/L (ref 3.5–5.1)
Sodium: 132 mmol/L — ABNORMAL LOW (ref 135–145)

## 2020-02-29 LAB — ECHOCARDIOGRAM COMPLETE
Area-P 1/2: 2.56 cm2
Height: 65 in
S' Lateral: 2.7 cm
Weight: 1368.62 oz

## 2020-02-29 LAB — CBC
HCT: 40.8 % (ref 36.0–46.0)
HCT: 41 % (ref 36.0–46.0)
Hemoglobin: 14 g/dL (ref 12.0–15.0)
Hemoglobin: 14.1 g/dL (ref 12.0–15.0)
MCH: 32.6 pg (ref 26.0–34.0)
MCH: 32.9 pg (ref 26.0–34.0)
MCHC: 34.1 g/dL (ref 30.0–36.0)
MCHC: 34.6 g/dL (ref 30.0–36.0)
MCV: 95.3 fL (ref 80.0–100.0)
MCV: 95.6 fL (ref 80.0–100.0)
Platelets: 269 10*3/uL (ref 150–400)
Platelets: 269 10*3/uL (ref 150–400)
RBC: 4.28 MIL/uL (ref 3.87–5.11)
RBC: 4.29 MIL/uL (ref 3.87–5.11)
RDW: 14 % (ref 11.5–15.5)
RDW: 14 % (ref 11.5–15.5)
WBC: 12.2 10*3/uL — ABNORMAL HIGH (ref 4.0–10.5)
WBC: 12.6 10*3/uL — ABNORMAL HIGH (ref 4.0–10.5)
nRBC: 0 % (ref 0.0–0.2)
nRBC: 0 % (ref 0.0–0.2)

## 2020-02-29 LAB — CREATININE, SERUM
Creatinine, Ser: 0.76 mg/dL (ref 0.44–1.00)
GFR calc Af Amer: >60 mL/min (ref >60)
GFR calc non Af Amer: >60 mL/min (ref >60)

## 2020-02-29 MED ORDER — MECLIZINE HCL 12.5 MG PO TABS
12.5000 mg | ORAL_TABLET | Freq: Three times a day (TID) | ORAL | Status: DC | PRN
  Administered 2020-02-29 – 2020-03-01 (×2): 12.5 mg via ORAL
  Filled 2020-02-29 (×3): qty 1

## 2020-02-29 MED ORDER — BUTALBITAL-APAP-CAFFEINE 50-325-40 MG PO TABS
1.0000 | ORAL_TABLET | Freq: Four times a day (QID) | ORAL | Status: DC | PRN
  Administered 2020-03-01 – 2020-03-02 (×2): 1 via ORAL
  Filled 2020-02-29 (×2): qty 1

## 2020-02-29 MED ORDER — ACETAMINOPHEN 325 MG PO TABS
650.0000 mg | ORAL_TABLET | Freq: Four times a day (QID) | ORAL | Status: DC | PRN
  Administered 2020-02-29: 650 mg via ORAL
  Filled 2020-02-29: qty 2

## 2020-02-29 MED ORDER — ENOXAPARIN SODIUM 30 MG/0.3ML ~~LOC~~ SOLN
30.0000 mg | Freq: Every day | SUBCUTANEOUS | Status: DC
  Administered 2020-02-29 – 2020-03-03 (×4): 30 mg via SUBCUTANEOUS
  Filled 2020-02-29 (×4): qty 0.3

## 2020-02-29 MED ORDER — ENSURE ENLIVE PO LIQD
237.0000 mL | Freq: Three times a day (TID) | ORAL | Status: DC
  Administered 2020-03-01 – 2020-03-04 (×10): 237 mL via ORAL

## 2020-02-29 MED ORDER — SENNOSIDES-DOCUSATE SODIUM 8.6-50 MG PO TABS
1.0000 | ORAL_TABLET | Freq: Two times a day (BID) | ORAL | Status: DC
  Administered 2020-02-29 – 2020-03-04 (×8): 1 via ORAL
  Filled 2020-02-29 (×8): qty 1

## 2020-02-29 MED ORDER — ENSURE ENLIVE PO LIQD: 237.0000 mL | Freq: Two times a day (BID) | ORAL | Status: DC

## 2020-02-29 MED ORDER — SODIUM CHLORIDE 0.9 % IV SOLN
1.0000 g | Freq: Every day | INTRAVENOUS | Status: DC
  Administered 2020-02-29 – 2020-03-01 (×2): 1 g via INTRAVENOUS
  Filled 2020-02-29 (×2): qty 1
  Filled 2020-02-29: qty 10

## 2020-02-29 MED ORDER — INFLUENZA VAC A&B SA ADJ QUAD 0.5 ML IM PRSY
0.5000 mL | PREFILLED_SYRINGE | INTRAMUSCULAR | Status: AC
  Administered 2020-03-01: 0.5 mL via INTRAMUSCULAR
  Filled 2020-02-29: qty 0.5

## 2020-02-29 NOTE — Progress Notes (Signed)
  Echocardiogram 2D Echocardiogram has been performed.  Holly Valenzuela 02/29/2020, 11:45 AM

## 2020-02-29 NOTE — NC FL2 (Signed)
[place] LEVEL OF CARE SCREENING TOOL     IDENTIFICATION  Patient Name: Holly Valenzuela [facility] Birthdate: 1940/12/18 Sex: female Admission Date (Current Location): 02/28/2020  [facility] and [place] Number:  Herbalist and Address:  [facility] [place], [place] [address] [place], [place] [address]      Provider Number: [id number]  Attending Physician Name and Address:  Florencia Reasons, MD  Relative Name and Phone Number:       Current Level of Care: Hospital Recommended Level of Care: [place] Prior Approval Number:    Date Approved/Denied:   PASRR Number: [id number]  Discharge Plan: SNF    Current Diagnoses: Patient Active Problem List   Diagnosis Date Noted  . Syncope 02/28/2020  . Essential hypertension 02/28/2020  . Hyponatremia     Orientation RESPIRATION BLADDER Height & Weight    Self, Time, Place  Normal Continent Weight: 85 lb 8.6 oz (38.8 kg) Height:  5\' 5"  (165.1 cm)  BEHAVIORAL SYMPTOMS/MOOD NEUROLOGICAL BOWEL NUTRITION STATUS      Continent Diet (see discharge summary)  AMBULATORY STATUS COMMUNICATION OF NEEDS Skin   Extensive Assist Verbally Skin abrasions, Bruising (abrasion on head; generalized ecchymosis; bruising on head.)                       Personal Care Assistance Level of Assistance  Bathing, Feeding, Dressing Bathing Assistance: Maximum assistance Feeding assistance: Independent Dressing Assistance: Maximum assistance     Functional Limitations Info  Sight, Hearing, Speech Sight Info: Adequate Hearing Info: Adequate Speech Info: Adequate    SPECIAL CARE FACTORS FREQUENCY  OT (By licensed OT), PT (By licensed PT)     PT Frequency: 5x week OT Frequency: 5x week            Contractures Contractures Info: Not present    Additional Factors Info  Code Status, Allergies, Psychotropic Code Status Info: Full Code Allergies Info: No Known Allergies Psychotropic Info:  mirtazapine (REMERON) tablet 7.5 mg daily at bedtime PO         Current Medications (02/29/2020):  This is the current hospital active medication list Current Facility-Administered Medications  Medication Dose Route Frequency Provider Last Rate Last Admin  . 0.9 %  sodium chloride infusion   Intravenous Continuous Rise Patience, MD 75 mL/hr at 02/28/20 2010 New Bag at 02/28/20 2010  . acetaminophen (TYLENOL) tablet 650 mg  650 mg Oral Q6H PRN Florencia Reasons, MD   650 mg at 02/29/20 1509  . ALPRAZolam (XANAX) tablet 0.25-0.5 mg  0.25-0.5 mg Oral TID PRN Rise Patience, MD      . atorvastatin (LIPITOR) tablet 10 mg  10 mg Oral Daily Rise Patience, MD   10 mg at 02/29/20 7371  . cefTRIAXone (ROCEPHIN) 1 g in sodium chloride 0.9 % 100 mL IVPB  1 g Intravenous Q0600 Rise Patience, MD 200 mL/hr at 02/29/20 0644 1 g at 02/29/20 0644  . enoxaparin (LOVENOX) injection 30 mg  30 mg Subcutaneous QHS Florencia Reasons, MD      . feeding supplement (ENSURE ENLIVE) (ENSURE ENLIVE) liquid 237 mL  237 mL Oral BID BM Florencia Reasons, MD      . Derrill Memo ON 03/01/2020] influenza vaccine adjuvanted (FLUAD) injection 0.5 mL  0.5 mL Intramuscular Tomorrow-1000 Florencia Reasons, MD      . losartan (COZAAR) tablet 50 mg  50 mg Oral Daily Rise Patience, MD   50 mg at 02/29/20  9923  . mirtazapine (REMERON) tablet 7.5 mg  7.5 mg Oral QHS Rise Patience, MD   7.5 mg at 02/29/20 0017  . ondansetron (ZOFRAN) tablet 4 mg  4 mg Oral Q6H PRN Rise Patience, MD       Or  . ondansetron [facility]) injection 4 mg  4 mg Intravenous Q6H PRN Rise Patience, MD   4 mg at 02/29/20 1143  . senna-docusate (Senokot-S) tablet 1 tablet  1 tablet Oral BID Florencia Reasons, MD         Discharge Medications: Please see discharge summary for a list of discharge medications.  Relevant Imaging Results:  Relevant Lab Results:   Additional Information [id number] [place], [place]

## 2020-02-29 NOTE — Progress Notes (Signed)
PROGRESS NOTE    Holly Valenzuela [facility]  GNF:[id number] DOB: 11/25/1940 DOA: 02/28/2020 PCP: Pcp, No    Chief Complaint  Patient presents with  . Fall    Brief Narrative:  Chief Complaint: Loss of consciousness.  HPI: Holly Valenzuela is a 79 y.o. female with history of hypertension, hyperlipidemia, ongoing tobacco abuse, COPD was brought to the ER after patient had an episode of loss of consciousness.  Patient states she was back home from shopping at a store when she fell outside her house unconscious.  Patient states she was not having any dizziness shortness of breath or chest pain prior to the episode.  Patient states she may have been on the floor for at least 2 hours after which he regained consciousness and called her family and was brought to the ER.  Patient states her blood pressure medicines dose was recently increased.  ED Course: In the ER patient appeared nonfocal CT head and C-spine was unremarkable labs were significant for sodium of 129 WBC was 19.7 EKG was showing normal sinus rhythm with QTC of 486 ms.  Patient was started on gentle hydration and admitted for further observation.  Chest x-ray unremarkable Covid test was negative.   Subjective:  Feeling weak, denies pain Orthostatic vital sign was not able to complete it due to her feeling nauseous  Assessment & Plan:   Principal Problem:   Syncope Active Problems:   Essential hypertension   Syncope with loss of consciousness during shopping  - unclear etiology -CT of head no acute findings, does show"Chronic ischemic microangiopathy and generalized volume loss without acute intracranial abnormality" -Telemetry has been unremarkable -Echo with preserved left ventricular EF -She is dehydrated on presentation with positive orthostatic vital signs -She could also have UTI -Per family patient has started having dizzy spells recently, PCP prescribed Antivert, on exam I do not see any nystagmus,  cardiology plan to do event monitor  Possible UTI -With report of recurrent UTI recently, urine culture obtained after antibiotic given -Continue Rocephin for now, follow-up urine culture  Leukocytosis: Could from UTI plus dehydration WBC 19.7 on presentation, WBC 12.2 today  Hyponatremia sodium 129 on presentation, likely due to dehydration, sodium improved to 132 today, continue hydration for another 24 hours, encourage oral intake  Hypertension/hyperlipidemia: Continue home medication Cozaar and Lipitor  COPD longtime smoker (daughter report patient started smoking since she was 5, she continues smokes) No wheezing, lung exam clear  History of migraine, on as needed Fioricet  Chronic constipation, Senokot  Underweight, will consult nutrition Patient report her weight has been stable Body mass index is 14.23 kg/m.  Failure to thrive: Patient reports to me that she walks daily she likes outdoor, but daughter state patient mostly stays at home watching TV  get PT eval  DVT prophylaxis: enoxaparin (LOVENOX) injection 30 mg Start: 02/29/20 2200   Code Status: Full Family Communication: daughter over the phone Disposition:   Status is: inpatient   Dispo: The patient is from: Home              Anticipated d/c is to: Home              Anticipated d/c date is: 24 to 48 hours              Patient currently not medically stable to discharge  Consultants:   None  Procedures:   None  Antimicrobials:   Rocephin     Objective: Vitals:   02/29/20 0134 02/29/20 0140  02/29/20 0433 02/29/20 1148  BP:  134/89 136/87 (!) 141/76  Pulse:  78 72 60  Resp:  16 18 19   Temp:  99.1 F (37.3 C) 99.1 F (37.3 C) 97.9 F (36.6 C)  TempSrc:  Oral Oral Oral  SpO2:  95% 96% 97%  Weight: 38.8 kg     Height: 5\' 5"  (1.651 m)       Intake/Output Summary (Last 24 hours) at 02/29/2020 1504 Last data filed at 02/29/2020 1107 Gross per 24 hour  Intake 487.49 ml  Output 1000 ml    Net -512.51 ml   Filed Weights   02/28/20 1958 02/29/20 0134  Weight: 38.6 kg 38.8 kg    Examination:  General exam: calm, NAD Respiratory system: Clear to auscultation. Respiratory effort normal. Cardiovascular system: S1 & S2 heard, RRR. No JVD, no murmur, No pedal edema. Gastrointestinal system: Abdomen is nondistended, soft and nontender. No organomegaly or masses felt. Normal bowel sounds heard. Central nervous system: Alert and oriented. No focal neurological deficits. Extremities: Symmetric 5 x 5 power. Skin: No rashes, lesions or ulcers Psychiatry: Judgement and insight appear normal. Mood & affect appropriate.     Data Reviewed: I have personally reviewed following labs and imaging studies  CBC: Recent Labs  Lab 02/28/20 2001 02/29/20 0707  WBC 19.7* 12.2*  12.6*  NEUTROABS 17.4*  --   HGB 15.1* 14.0  14.1  HCT 44.4 41.0  40.8  MCV 96.7 95.6  95.3  PLT 299 269  751    Basic Metabolic Panel: Recent Labs  Lab 02/28/20 2001 02/29/20 0707  NA 129* 132*  K 4.0 3.6  CL 88* 96*  CO2 27 24  GLUCOSE 131* 96  BUN 10 6*  CREATININE 0.91 0.76  0.71  CALCIUM 8.8* 8.3*    GFR: Estimated Creatinine Clearance: 34.4 mL/min (by C-G formula based on SCr of 0.71 mg/dL).  Liver Function Tests: Recent Labs  Lab 02/28/20 2001  AST 28  ALT 22  ALKPHOS 116  BILITOT 0.6  PROT 6.6  ALBUMIN 3.9    CBG: No results for input(s): GLUCAP in the last 168 hours.   Recent Results (from the past 240 hour(s))  Respiratory Panel by RT PCR (Flu A&B, Covid) - Nasopharyngeal Swab     Status: None   Collection Time: 02/28/20  8:06 PM   Specimen: Nasopharyngeal Swab  Result Value Ref Range Status   SARS Coronavirus 2 by RT PCR NEGATIVE NEGATIVE Final    Comment: (NOTE) SARS-CoV-2 target nucleic acids are NOT DETECTED.  The SARS-CoV-2 RNA is generally detectable in upper respiratoy specimens during the acute phase of infection. The lowest concentration of  SARS-CoV-2 viral copies this assay can detect is 131 copies/mL. A negative result does not preclude SARS-Cov-2 infection and should not be used as the sole basis for treatment or other patient management decisions. A negative result may occur with  improper specimen collection/handling, submission of specimen other than nasopharyngeal swab, presence of viral mutation(s) within the areas targeted by this assay, and inadequate number of viral copies (<131 copies/mL). A negative result must be combined with clinical observations, patient history, and epidemiological information. The expected result is Negative.  Fact Sheet for Patients:  PinkCheek.be  Fact Sheet for Healthcare Providers:  GravelBags.it  This test is no t yet approved or cleared by the [place] FDA and  has been authorized for detection and/or diagnosis of SARS-CoV-2 by FDA under an Emergency Use Authorization (EUA). This EUA will remain  in effect (meaning this test can be used) for the duration of the COVID-19 declaration under Section 564(b)(1) of the Act, 21 U.S.C. section 360bbb-3(b)(1), unless the authorization is terminated or revoked sooner.     Influenza A by PCR NEGATIVE NEGATIVE Final   Influenza B by PCR NEGATIVE NEGATIVE Final    Comment: (NOTE) The Xpert Xpress SARS-CoV-2/FLU/RSV assay is intended as an aid in  the diagnosis of influenza from Nasopharyngeal swab specimens and  should not be used as a sole basis for treatment. Nasal washings and  aspirates are unacceptable for Xpert Xpress SARS-CoV-2/FLU/RSV  testing.  Fact Sheet for Patients: PinkCheek.be  Fact Sheet for Healthcare Providers: GravelBags.it  This test is not yet approved or cleared by the [place] FDA and  has been authorized for detection and/or diagnosis of SARS-CoV-2 by  FDA under an Emergency Use  Authorization (EUA). This EUA will remain  in effect (meaning this test can be used) for the duration of the  Covid-19 declaration under Section 564(b)(1) of the Act, 21  U.S.C. section 360bbb-3(b)(1), unless the authorization is  terminated or revoked. Performed at [facility], [place] [address] [place], [place] [address]          Radiology Studies: CT Head Wo Contrast  Result Date: 02/28/2020 CLINICAL DATA:  Fall EXAM: CT HEAD WITHOUT CONTRAST CT CERVICAL SPINE WITHOUT CONTRAST TECHNIQUE: Multidetector CT imaging of the head and cervical spine was performed following the standard protocol without intravenous contrast. Multiplanar CT image reconstructions of the cervical spine were also generated. COMPARISON:  None. FINDINGS: CT HEAD FINDINGS Brain: There is no mass, hemorrhage or extra-axial collection. There is generalized atrophy without lobar predilection. There is hypoattenuation of the periventricular white matter, most commonly indicating chronic ischemic microangiopathy. Vascular: No abnormal hyperdensity of the major intracranial arteries or dural venous sinuses. No intracranial atherosclerosis. Skull: The visualized skull base, calvarium and extracranial soft tissues are normal. Sinuses/Orbits: No fluid levels or advanced mucosal thickening of the visualized paranasal sinuses. No mastoid or middle ear effusion. The orbits are normal. CT CERVICAL SPINE FINDINGS Alignment: Reversal of normal cervical lordosis centered at C5. Grade 1 anterolisthesis at C4-5. Grade 1 retrolisthesis at C5-6. Skull base and vertebrae: No acute fracture. Soft tissues and spinal canal: No prevertebral fluid or swelling. No visible canal hematoma. Disc levels: No advanced spinal canal or neural foraminal stenosis. Upper chest: No pneumothorax, pulmonary nodule or pleural effusion. Other: Normal visualized paraspinal cervical soft tissues. IMPRESSION: 1. Chronic ischemic microangiopathy and generalized volume  loss without acute intracranial abnormality. 2. No acute fracture or static subluxation of the cervical spine. Electronically Signed   By: Ulyses Jarred M.D.   On: 02/28/2020 20:51   CT Cervical Spine Wo Contrast  Result Date: 02/28/2020 CLINICAL DATA:  Fall EXAM: CT HEAD WITHOUT CONTRAST CT CERVICAL SPINE WITHOUT CONTRAST TECHNIQUE: Multidetector CT imaging of the head and cervical spine was performed following the standard protocol without intravenous contrast. Multiplanar CT image reconstructions of the cervical spine were also generated. COMPARISON:  None. FINDINGS: CT HEAD FINDINGS Brain: There is no mass, hemorrhage or extra-axial collection. There is generalized atrophy without lobar predilection. There is hypoattenuation of the periventricular white matter, most commonly indicating chronic ischemic microangiopathy. Vascular: No abnormal hyperdensity of the major intracranial arteries or dural venous sinuses. No intracranial atherosclerosis. Skull: The visualized skull base, calvarium and extracranial soft tissues are normal. Sinuses/Orbits: No fluid levels or advanced mucosal thickening of the visualized paranasal sinuses. No mastoid or  middle ear effusion. The orbits are normal. CT CERVICAL SPINE FINDINGS Alignment: Reversal of normal cervical lordosis centered at C5. Grade 1 anterolisthesis at C4-5. Grade 1 retrolisthesis at C5-6. Skull base and vertebrae: No acute fracture. Soft tissues and spinal canal: No prevertebral fluid or swelling. No visible canal hematoma. Disc levels: No advanced spinal canal or neural foraminal stenosis. Upper chest: No pneumothorax, pulmonary nodule or pleural effusion. Other: Normal visualized paraspinal cervical soft tissues. IMPRESSION: 1. Chronic ischemic microangiopathy and generalized volume loss without acute intracranial abnormality. 2. No acute fracture or static subluxation of the cervical spine. Electronically Signed   By: Ulyses Jarred M.D.   On: 02/28/2020  20:51   DG Chest Port 1 View  Result Date: 02/28/2020 CLINICAL DATA:  Fall.  Syncope. EXAM: PORTABLE CHEST 1 VIEW COMPARISON:  04/17/2014 chest radiograph, 03/15/2018 chest CT FINDINGS: The lungs are hyperinflated with diffuse interstitial prominence. No focal airspace consolidation or pulmonary edema. No pleural effusion or pneumothorax. Normal cardiomediastinal contours. IMPRESSION: COPD without acute airspace disease. Electronically Signed   By: Ulyses Jarred M.D.   On: 02/28/2020 20:35        Scheduled Meds: . atorvastatin  10 mg Oral Daily  . enoxaparin (LOVENOX) injection  30 mg Subcutaneous QHS  . [START ON 03/01/2020] influenza vaccine adjuvanted  0.5 mL Intramuscular Tomorrow-1000  . losartan  50 mg Oral Daily  . mirtazapine  7.5 mg Oral QHS   Continuous Infusions: . sodium chloride 75 mL/hr at 02/28/20 2010  . cefTRIAXone (ROCEPHIN)  IV 1 g (02/29/20 0644)     LOS: 0 days   Time spent: 35 mins Greater than 50% of this time was spent in counseling, explanation of diagnosis, planning of further management, and coordination of care.  I have personally reviewed and interpreted on  02/29/2020 daily labs, tele strips, imagings as discussed above under date review session and assessment and plans.  I reviewed all nursing notes, pharmacy notes, consultant note,  vitals, pertinent old records  I have discussed plan of care as described above with RN , patient and family on 02/29/2020  Voice Recognition /Dragon dictation system was used to create this note, attempts have been made to correct errors. Please contact the author with questions and/or clarifications.   Florencia Reasons, MD PhD FACP [facility]  Available via Epic secure chat 7am-7pm for nonurgent issues Please page for urgent issues To page the attending provider between 7A-7P or the covering provider during after hours 7P-7A, please log into the web site [email or web address] and access using universal [place] password  for that web site. If you do not have the password, please call the hospital operator.    02/29/2020, 3:04 PM

## 2020-02-29 NOTE — Progress Notes (Signed)
Initial Nutrition Assessment  DOCUMENTATION CODES:   Underweight  INTERVENTION:  Provide Ensure Enlive po TID, each supplement provides 350 kcal and 20 grams of protein  Encourage adequate PO intake.   NUTRITION DIAGNOSIS:   Increased nutrient needs related to chronic illness (COPD) as evidenced by estimated needs.  GOAL:   Patient will meet greater than or equal to 90% of their needs  MONITOR:   PO intake, Supplement acceptance, Skin, Weight trends, Labs, I & O's  REASON FOR ASSESSMENT:   Consult Assessment of nutrition requirement/status  ASSESSMENT:   79 y.o. female with history of hypertension, hyperlipidemia, ongoing tobacco abuse, COPD presents with an episode of loss of consciousness.  Pt unavailable during attempted time of contact. RD unable to obtain pt nutrition history at this time. RD to order nutritional supplements to aid in caloric and protein needs. Unable to complete Nutrition-Focused physical exam at this time.   Labs and medications reviewed.   Diet Order:   Diet Order            Diet Heart Room service appropriate? Yes; Fluid consistency: Thin  Diet effective now                 EDUCATION NEEDS:   Not appropriate for education at this time  Skin:  Skin Assessment: Reviewed RN Assessment  Last BM:  Unknown  Height:   Ht Readings from Last 1 Encounters:  02/29/20 5\' 5"  (1.651 m)    Weight:   Wt Readings from Last 1 Encounters:  02/29/20 38.8 kg   BMI:  Body mass index is 14.23 kg/m.  Estimated Nutritional Needs:   Kcal:  1500-1700  Protein:  70-80 grams  Fluid:  >/= 1.5 L/day  Corrin Parker, MS, RD, LDN RD pager number/after hours weekend pager number on Amion.

## 2020-02-29 NOTE — Consult Note (Addendum)
Cardiology Consultation:   Patient ID: Holly Valenzuela [facility] MRN: [id number]; DOB: September 19, 1940  Admit date: 02/28/2020 Date of Consult: 02/29/2020  Primary Care Provider: [facility] Cardiologist: New to Dr. Gardiner Rhyme   Patient Profile:   Holly Valenzuela is a 79 y.o. female with a hx of HTN, HLD, COPD with ongoing tobacco smoking and recurrent UTI (followed by urologist) who is being seen today for the evaluation of Syncope at the request of Dr. Hal Hope.   History of Present Illness:   Holly Valenzuela was in [place] until yesterday when had syncope. She came from grocery shopping and unloading and had LOC. She fell outside her condo. LOC for about 2 hours and she called [facility]. Unwitnessed. No prodromal symptoms. Had nausea and vomiting after regaining consciousness. She was admitted for evaluation.   UA concerning for UTI NA 129>>132 K 4.0>>3.6 SCr normal WBC 19.7>>12.6 Respiratory panel negative for COVID and influenza CT of head and cervical spine unremarkable  Denies prior cardiac issue. No chest pain, dyspnea, palpitations, dizziness, orthopnea, PND, LE edema or melena. Smokes few cigarette/day.    Past Medical History:  Diagnosis Date  . COPD (chronic obstructive pulmonary disease) ([place])   . HLD (hyperlipidemia)   . Hypertension   . Migraines   . Tobacco abuse     Past Surgical History:  Procedure Laterality Date  . APPENDECTOMY    . CESAREAN SECTION      Inpatient Medications: Scheduled Meds: . atorvastatin  10 mg Oral Daily  . enoxaparin (LOVENOX) injection  40 mg Subcutaneous QHS  . [START ON 03/01/2020] influenza vaccine adjuvanted  0.5 mL Intramuscular Tomorrow-1000  . losartan  50 mg Oral Daily  . mirtazapine  7.5 mg Oral QHS   Continuous Infusions: . sodium chloride 75 mL/hr at 02/28/20 2010  . cefTRIAXone (ROCEPHIN)  IV 1 g (02/29/20 0644)   PRN Meds: ALPRAZolam, ondansetron **OR** ondansetron (ZOFRAN) IV  Allergies:   No  Known Allergies  Social History:   Social History   Socioeconomic History  . Marital status: Divorced    Spouse name: Not on file  . Number of children: Not on file  . Years of education: Not on file  . Highest education level: Not on file  Occupational History  . Not on file  Tobacco Use  . Smoking status: Current Every Day Smoker  . Smokeless tobacco: Never Used  Substance and Sexual Activity  . Alcohol use: Never  . Drug use: Not on file  . Sexual activity: Not on file  Other Topics Concern  . Not on file  Social History Narrative  . Not on file   Social Determinants of Health   Financial Resource Strain:   . Difficulty of Paying Living Expenses: Not on file  Food Insecurity:   . Worried About Charity fundraiser in the Last Year: Not on file  . Ran Out of Food in the Last Year: Not on file  Transportation Needs:   . Lack of Transportation (Medical): Not on file  . Lack of Transportation (Non-Medical): Not on file  Physical Activity:   . Days of Exercise per Week: Not on file  . Minutes of Exercise per Session: Not on file  Stress:   . Feeling of Stress : Not on file  Social Connections:   . Frequency of Communication with Friends and Family: Not on file  . Frequency of Social Gatherings with Friends and Family: Not on file  . Attends Religious Services:  Not on file  . Active Member of Clubs or Organizations: Not on file  . Attends Archivist Meetings: Not on file  . Marital Status: Not on file  Intimate Partner Violence:   . Fear of Current or Ex-Partner: Not on file  . Emotionally Abused: Not on file  . Physically Abused: Not on file  . Sexually Abused: Not on file    Family History:   Family History  Family history unknown: Yes     ROS:  Please see the history of present illness.  All other ROS reviewed and negative.     Physical Exam/Data:   Vitals:   02/29/20 0134 02/29/20 0140 02/29/20 0433 02/29/20 1148  BP:  134/89 136/87 (!)  141/76  Pulse:  78 72 60  Resp:  16 18 19   Temp:  99.1 F (37.3 C) 99.1 F (37.3 C) 97.9 F (36.6 C)  TempSrc:  Oral Oral Oral  SpO2:  95% 96% 97%  Weight: 38.8 kg     Height: 5\' 5"  (1.651 m)       Intake/Output Summary (Last 24 hours) at 02/29/2020 1312 Last data filed at 02/29/2020 1107 Gross per 24 hour  Intake 487.49 ml  Output 1000 ml  Net -512.51 ml   Last 3 Weights 02/29/2020 02/28/2020  Weight (lbs) 85 lb 8.6 oz 85 lb  Weight (kg) 38.8 kg 38.556 kg     Body mass index is 14.23 kg/m.  General:  Thin frail female in NAD HEENT: normal Lymph: no adenopathy Neck: no JVD Endocrine:  No thryomegaly Vascular: No carotid bruits; FA pulses 2+ bilaterally without bruits  Cardiac:  normal S1, S2; RRR; no murmur  Lungs:  clear to auscultation bilaterally, no wheezing, rhonchi or rales  Abd: soft, nontender, no hepatomegaly  Ext: no edema Musculoskeletal:  No deformities, BUE and BLE strength normal and equal Skin: warm and dry  Neuro:  CNs 2-12 intact, no focal abnormalities noted Psych:  Normal affect   EKG:  The EKG was personally reviewed and demonstrates:  Sinus rhythm with artifacts Telemetry:  Telemetry was personally reviewed and demonstrates:  NSR  Relevant CV Studies: Echo done pending reading   Laboratory Data:  Chemistry Recent Labs  Lab 02/28/20 2001 02/29/20 0707  NA 129* 132*  K 4.0 3.6  CL 88* 96*  CO2 27 24  GLUCOSE 131* 96  BUN 10 6*  CREATININE 0.91 0.76  0.71  CALCIUM 8.8* 8.3*  GFRNONAA 60* >60  >60  GFRAA >60 >60  >60  ANIONGAP 14 12    Recent Labs  Lab 02/28/20 2001  PROT 6.6  ALBUMIN 3.9  AST 28  ALT 22  ALKPHOS 116  BILITOT 0.6   Hematology Recent Labs  Lab 02/28/20 2001 02/29/20 0707  WBC 19.7* 12.2*  12.6*  RBC 4.59 4.29  4.28  HGB 15.1* 14.0  14.1  HCT 44.4 41.0  40.8  MCV 96.7 95.6  95.3  MCH 32.9 32.6  32.9  MCHC 34.0 34.1  34.6  RDW 13.8 14.0  14.0  PLT 299 269  269    Radiology/Studies:  CT  Head Wo Contrast  Result Date: 02/28/2020 CLINICAL DATA:  Fall EXAM: CT HEAD WITHOUT CONTRAST CT CERVICAL SPINE WITHOUT CONTRAST TECHNIQUE: Multidetector CT imaging of the head and cervical spine was performed following the standard protocol without intravenous contrast. Multiplanar CT image reconstructions of the cervical spine were also generated. COMPARISON:  None. FINDINGS: CT HEAD FINDINGS Brain: There is no mass, hemorrhage  or extra-axial collection. There is generalized atrophy without lobar predilection. There is hypoattenuation of the periventricular white matter, most commonly indicating chronic ischemic microangiopathy. Vascular: No abnormal hyperdensity of the major intracranial arteries or dural venous sinuses. No intracranial atherosclerosis. Skull: The visualized skull base, calvarium and extracranial soft tissues are normal. Sinuses/Orbits: No fluid levels or advanced mucosal thickening of the visualized paranasal sinuses. No mastoid or middle ear effusion. The orbits are normal. CT CERVICAL SPINE FINDINGS Alignment: Reversal of normal cervical lordosis centered at C5. Grade 1 anterolisthesis at C4-5. Grade 1 retrolisthesis at C5-6. Skull base and vertebrae: No acute fracture. Soft tissues and spinal canal: No prevertebral fluid or swelling. No visible canal hematoma. Disc levels: No advanced spinal canal or neural foraminal stenosis. Upper chest: No pneumothorax, pulmonary nodule or pleural effusion. Other: Normal visualized paraspinal cervical soft tissues. IMPRESSION: 1. Chronic ischemic microangiopathy and generalized volume loss without acute intracranial abnormality. 2. No acute fracture or static subluxation of the cervical spine. Electronically Signed   By: Ulyses Jarred M.D.   On: 02/28/2020 20:51   CT Cervical Spine Wo Contrast  Result Date: 02/28/2020 CLINICAL DATA:  Fall EXAM: CT HEAD WITHOUT CONTRAST CT CERVICAL SPINE WITHOUT CONTRAST TECHNIQUE: Multidetector CT imaging of the head  and cervical spine was performed following the standard protocol without intravenous contrast. Multiplanar CT image reconstructions of the cervical spine were also generated. COMPARISON:  None. FINDINGS: CT HEAD FINDINGS Brain: There is no mass, hemorrhage or extra-axial collection. There is generalized atrophy without lobar predilection. There is hypoattenuation of the periventricular white matter, most commonly indicating chronic ischemic microangiopathy. Vascular: No abnormal hyperdensity of the major intracranial arteries or dural venous sinuses. No intracranial atherosclerosis. Skull: The visualized skull base, calvarium and extracranial soft tissues are normal. Sinuses/Orbits: No fluid levels or advanced mucosal thickening of the visualized paranasal sinuses. No mastoid or middle ear effusion. The orbits are normal. CT CERVICAL SPINE FINDINGS Alignment: Reversal of normal cervical lordosis centered at C5. Grade 1 anterolisthesis at C4-5. Grade 1 retrolisthesis at C5-6. Skull base and vertebrae: No acute fracture. Soft tissues and spinal canal: No prevertebral fluid or swelling. No visible canal hematoma. Disc levels: No advanced spinal canal or neural foraminal stenosis. Upper chest: No pneumothorax, pulmonary nodule or pleural effusion. Other: Normal visualized paraspinal cervical soft tissues. IMPRESSION: 1. Chronic ischemic microangiopathy and generalized volume loss without acute intracranial abnormality. 2. No acute fracture or static subluxation of the cervical spine. Electronically Signed   By: Ulyses Jarred M.D.   On: 02/28/2020 20:51   DG Chest Port 1 View  Result Date: 02/28/2020 CLINICAL DATA:  Fall.  Syncope. EXAM: PORTABLE CHEST 1 VIEW COMPARISON:  04/17/2014 chest radiograph, 03/15/2018 chest CT FINDINGS: The lungs are hyperinflated with diffuse interstitial prominence. No focal airspace consolidation or pulmonary edema. No pleural effusion or pneumothorax. Normal cardiomediastinal contours.  IMPRESSION: COPD without acute airspace disease. Electronically Signed   By: Ulyses Jarred M.D.   On: 02/28/2020 20:35    Assessment and Plan:   1. Syncope - Unwitnessed without prodromal symptoms. Work up reassuring so far except UTI (hx of recurrent UTI). Echo done, pending reading. Telemetry without arrhythmias. Pending orthostatics and PT evaluation. Likely 30 days event monitor  2. COPD with ongoing tobacco smoking - No active wheezing - Recommended smoking cessation  3. HTN - BP stable on Losartan  4. UTI with hx of recurrent UTI - Followed by Dr. Tresa Moore  For questions or updates, please contact [place] Please consult  [email or web address] for contact info under    Jarrett Soho, PA  02/29/2020 1:12 PM   Patient seen and examined.  Agree with above documentation.  Ms. Dam is an 79 year old female with a history of COPD, tobacco use, hypertension, hyperlipidemia, recurrent UTI who we are consulted today for evaluation of syncope at the request of Dr. Hal Hope.  Patient reports she had an episode of syncope yesterday.  She had come home from the grocery store and was unloading groceries when she had sudden loss of consciousness.  She denies any significant exertion prior to syncope.  She denies any prodromal symptoms.  Specifically denies any chest pain, dyspnea, or palpitations.  States that she is unsure how long she was unconscious for but when she woke up she was weak and unable to stand.  States that she laid on the ground for about 2 hours before being able to get up and use her phone and call for help.  On presentation to the ED, initial vital signs notable for BP 132/88, pulse 84, SPO2 96% on room air.  Labs notable for sodium 129, creatinine 0.9, WBC 19.7, hemoglobin 15.1, COVID-19 negative.  CT head and cervical spine were unremarkable.  EKG shows sinus rhythm, poor R wave progression, rate 84, artifact.  Telemetry personally reviewed and shows sinus rhythm  with rate in 70s.  Echocardiogram personally reviewed and shows normal biventricular function, no significant valvular disease.  On exam, patient is alert and oriented, regular rate and rhythm, no murmurs, lungs CTAB, no LE edema or JVD.   Unclear cause of syncopal episode.  No structural heart disease on echocardiogram.  Given lack of prodromal symptoms, concerning for arrhythmia.  Would continue to monitor on telemetry and will plan for 30-day monitor on discharge.  Donato Heinz, MD

## 2020-02-29 NOTE — Evaluation (Signed)
Physical Therapy Evaluation Patient Details Name: Holly Valenzuela MRN: [id number] DOB: [birth year] Today's Date: 02/29/2020   History of Present Illness  The pt is an 79 yo female presenting after a syncopal episode resulting in a fall and being down ~2 hours prior to regaining consciousness and calling for assistance. The pt is undergoing continued work-up to determine the cause of the syncope and possible UTI. PMH includes: HTN, migraines.    Clinical Impression  Pt in bed upon arrival of PT, agreeable to evaluation at this time. Prior to admission the pt reports she was independent with all mobility and ADLs without the use of AD or physical assist or supervision. She also denies any falls in the last 6 months and reports she was still driving for errands. The pt now presents with limitations in functional mobility, activity tolerance, strength, and stability due to above dx, and will continue to benefit from skilled PT to address these deficits. The pt was able to complete bed mobility with supervision only and use of elevated HOB and rails, but no physical assist. However, once in a seated position the pt had progressively worsening headache and nausea that prohibited further activity. The pt's BP increased from 126/84 in supine to 159/100 in sitting, the RN was notified and MD was paged. The pt will continue to benefit from skilled PT to progress OOB mobility, assess DME needs, and progress strength and stability to facilitate return to independence. Due to lack of any support from family or friends after d/c, I recommend short stint of SNF rehab to improve safety with independence prior to return home.      Follow Up Recommendations SNF;Supervision/Assistance - 24 hour    Equipment Recommendations  None recommended by PT    Recommendations for Other Services       Precautions / Restrictions Precautions Precautions: Fall Precaution Comments: admitted following fall at home. watch  BP Restrictions Weight Bearing Restrictions: No      Mobility  Bed Mobility Overal bed mobility: Needs Assistance Bed Mobility: Supine to Sit;Sit to Sidelying     Supine to sit: Supervision;HOB elevated   Sit to sidelying: Supervision General bed mobility comments: pt able to come to sitting EOB with cues only, no physical assist. HOB elevated. declined further transfer due to reports of onset of headache and nausea with sitting EOB BP to 159/100 mmHg     Balance Overall balance assessment: Needs assistance Sitting-balance support: No upper extremity supported;Feet supported Sitting balance-Leahy Scale: Good                                       Pertinent Vitals/Pain Pain Assessment: No/denies pain    Home Living Family/patient expects to be discharged to:: Private residence Living Arrangements: Alone Available Help at Discharge:  (reports no one to assist following d/c) Type of Home:  (condo) Home Access: Stairs to enter   [employer] of Steps: 2-3 at front with single rail, 5 at back Home Layout: One level Home Equipment: Shower seat;Grab bars - tub/shower;Hand held Tourist information centre manager - 2 wheels;Cane - single point      Prior Function Level of Independence: Independent         Comments: reports she is still driving to run errands, and then fully independent in the home.     Hand Dominance   Dominant Hand: Left    Extremity/Trunk Assessment   Upper Extremity  Assessment Upper Extremity Assessment: Generalized weakness    Lower Extremity Assessment Lower Extremity Assessment: Generalized weakness    Cervical / Trunk Assessment Cervical / Trunk Assessment: Kyphotic  Communication      Cognition Arousal/Alertness: Awake/alert Behavior During Therapy: WFL for tasks assessed/performed Overall Cognitive Status: No family/caregiver present to determine baseline cognitive functioning                                  General Comments: Pt answering questions appropriately through session, but quesionable accuracy in regards to independence and activity prior to admission      General Comments General comments (skin integrity, edema, etc.): onset of nausea with continued sitting EOB, BP from 126/84 to 159/100 with pt reports nausea and headache. RN alerted        Assessment/Plan    PT Assessment Patient needs continued PT services  PT Problem List Decreased strength;Decreased mobility;Decreased safety awareness;Decreased activity tolerance;Decreased balance       PT Treatment Interventions Gait training;Stair training;Functional mobility training;Therapeutic activities;Patient/family education;DME instruction;Therapeutic exercise;Balance training    PT Goals (Current goals can be found in the Care Plan section)  Acute Rehab PT Goals Patient Stated Goal: reduce nausea and headache PT Goal Formulation: With patient Time For Goal Achievement: 03/14/20 Potential to Achieve Goals: Good    Frequency Min 2X/week   Barriers to discharge Decreased caregiver support pt lives alone with no friend or family to assist at d/c       AM-PAC PT "6 Clicks" Mobility  Outcome Measure Help needed turning from your back to your side while in a flat bed without using bedrails?: None Help needed moving from lying on your back to sitting on the side of a flat bed without using bedrails?: None Help needed moving to and from a bed to a chair (including a wheelchair)?: A Little Help needed standing up from a chair using your arms (e.g., wheelchair or bedside chair)?: A Little Help needed to walk in hospital room?: A Lot Help needed climbing 3-5 steps with a railing? : A Lot 6 Click Score: 18    End of Session   Activity Tolerance: Patient limited by pain;Other (comment) (nausea) Patient left: in bed;with call bell/phone within reach Nurse Communication: Mobility status (pt soiled in bed, elevated BP (MD  paged)) PT Visit Diagnosis: Difficulty in walking, not elsewhere classified (R26.2);Muscle weakness (generalized) (M62.81)    Time: 9767-3419 PT Time Calculation (min) (ACUTE ONLY): 24 min   Charges:   PT Evaluation $PT Eval Moderate Complexity: 1 Mod PT Treatments $Therapeutic Activity: 8-22 mins        Karma Ganja, PT, DPT   Acute Rehabilitation Department Pager #: [phone]  Otho Bellows 02/29/2020, 3:17 PM

## 2020-03-01 ENCOUNTER — Inpatient Hospital Stay (HOSPITAL_COMMUNITY): Payer: Medicare Other

## 2020-03-01 LAB — BASIC METABOLIC PANEL
Anion gap: 10 (ref 5–15)
BUN: 5 mg/dL — ABNORMAL LOW (ref 8–23)
CO2: 24 mmol/L (ref 22–32)
Calcium: 8.4 mg/dL — ABNORMAL LOW (ref 8.9–10.3)
Chloride: 98 mmol/L (ref 98–111)
Creatinine, Ser: 0.65 mg/dL (ref 0.44–1.00)
GFR calc Af Amer: >60 mL/min (ref >60)
GFR calc non Af Amer: >60 mL/min (ref >60)
Glucose, Bld: 98 mg/dL (ref 70–99)
Potassium: 3.4 mmol/L — ABNORMAL LOW (ref 3.5–5.1)
Sodium: 132 mmol/L — ABNORMAL LOW (ref 135–145)

## 2020-03-01 LAB — MAGNESIUM: Magnesium: 2 mg/dL (ref 1.7–2.4)

## 2020-03-01 LAB — CBC WITH DIFFERENTIAL/PLATELET
Abs Immature Granulocytes: 0.05 10*3/uL (ref 0.00–0.07)
Basophils Absolute: 0.1 10*3/uL (ref 0.0–0.1)
Basophils Relative: 1 %
Eosinophils Absolute: 0.1 10*3/uL (ref 0.0–0.5)
Eosinophils Relative: 1 %
HCT: 40.4 % (ref 36.0–46.0)
Hemoglobin: 13.5 g/dL (ref 12.0–15.0)
Immature Granulocytes: 1 %
Lymphocytes Relative: 28 %
Lymphs Abs: 2.9 10*3/uL (ref 0.7–4.0)
MCH: 32.3 pg (ref 26.0–34.0)
MCHC: 33.4 g/dL (ref 30.0–36.0)
MCV: 96.7 fL (ref 80.0–100.0)
Monocytes Absolute: 0.9 10*3/uL (ref 0.1–1.0)
Monocytes Relative: 9 %
Neutro Abs: 6.5 10*3/uL (ref 1.7–7.7)
Neutrophils Relative %: 60 %
Platelets: 258 10*3/uL (ref 150–400)
RBC: 4.18 MIL/uL (ref 3.87–5.11)
RDW: 14.2 % (ref 11.5–15.5)
WBC: 10.5 10*3/uL (ref 4.0–10.5)
nRBC: 0 % (ref 0.0–0.2)

## 2020-03-01 LAB — TSH: TSH: 0.651 u[IU]/mL (ref 0.350–4.500)

## 2020-03-01 MED ORDER — PIPERACILLIN-TAZOBACTAM 3.375 G IVPB
3.3750 g | Freq: Three times a day (TID) | INTRAVENOUS | Status: DC
  Administered 2020-03-01 – 2020-03-02 (×3): 3.375 g via INTRAVENOUS
  Filled 2020-03-01 (×4): qty 50

## 2020-03-01 MED ORDER — POTASSIUM CHLORIDE CRYS ER 20 MEQ PO TBCR
40.0000 meq | EXTENDED_RELEASE_TABLET | Freq: Once | ORAL | Status: AC
  Administered 2020-03-01: 40 meq via ORAL
  Filled 2020-03-01: qty 2

## 2020-03-01 NOTE — TOC Initial Note (Signed)
Transition of Care [facility]) - Initial/Assessment Note    Patient Details  Name: Holly Valenzuela MRN: [id number] Date of Birth: 1941-01-16  Transition of Care [facility]) CM/SW Contact:    Alexander Mt, LCSW Phone Number: 03/01/2020, 11:59 AM  Clinical Narrative:                 CSW spoke with pt at bedside. Introduced self, role, reason for visit. Pt from home alone, her daughter [name] lives in [place]. Confirmed home address and PCP. Pt has been vaccinated for COVID. She shares with this Probation officer that she feels dizzy still. CSW shared with Probation officer the recommendations for SNF. She is hesitant as she wants to return home. CSW requested permission to call pt daughter [name] as she had called on 9/30 and expressed concerns with pt being alone. Pt agreeable.   CSW called [name] at [phone]. CSW again introduced self, role, reason for call. Pt daughter placed on speakerphone and we had a group conversation about recommendations and concerns around pt going directly home given that she wouldn't have 24/7 care. Pt daughter and pt agreeable to referral to SNF, pt was at [facility] a [facility] years [facility] following a orthopedic procedure. CSW will send referral out and explained that a team member would follow up this weekend with offers. Pt daughter took down this writers name and number; also requested call from MD. CSW sent secure message to Dr. Erlinda Hong with pt daughter name and number for update.   Expected Discharge Plan: Skilled Nursing Facility Barriers to Discharge: Continued Medical Work up   Patient Goals and CMS Choice Patient states their goals for this hospitalization and ongoing recovery are:: get back home when she can CMS Medicare.gov Compare Post Acute Care list provided to:: Patient Choice offered to / list presented to : Patient, Adult Children  Expected Discharge Plan and Services Expected Discharge Plan: [place] In-house Referral: Clinical Social  Work Discharge Planning Services: CM Consult Post Acute Care Choice: [place] arrangements for the past 2 months: [place]    Prior Living Arrangements/Services Living arrangements for the past 2 months: Single Family Home Lives with:: Self Patient language and need for interpreter reviewed:: Yes (no needs) Do you feel safe going back to the place where you live?: Yes (pt daughter concerned about falls)      Need for Family Participation in Patient Care: Yes (Comment) (assistance w/ daily cares) Care giver support system in place?: No (comment) (pt lives alone, no family around able to assist) Current home services: DME Criminal Activity/Legal Involvement Pertinent to Current Situation/Hospitalization: No - Comment as needed  Activities of Daily Living Home Assistive Devices/Equipment: Cane (specify quad or straight) ADL Screening (condition at time of admission) Patient's cognitive ability adequate to safely complete daily activities?: No Is the patient deaf or have difficulty hearing?: No Does the patient have difficulty seeing, even when wearing glasses/contacts?: No Does the patient have difficulty concentrating, remembering, or making decisions?: No Patient able to express need for assistance with ADLs?: Yes Does the patient have difficulty dressing or bathing?: No Independently performs ADLs?: Yes (appropriate for developmental age) Does the patient have difficulty walking or climbing stairs?: Yes Weakness of Legs: Both Weakness of Arms/Hands: Both  Permission Sought/Granted Permission sought to share information with : Family Supports Permission granted to share information with : Yes, Verbal Permission Granted  Share Information with NAME: Enedina Finner  Permission granted to share info w AGENCY: SNFs  Permission granted to share info w Relationship: daughter  Permission granted to share info w Contact Information:  [phone]  Emotional Assessment Appearance:: Appears stated age Attitude/Demeanor/Rapport: Engaged Affect (typically observed): Accepting, Adaptable, Appropriate, Quiet Orientation: : Oriented to Self, Oriented to Place, Oriented to  Time, Oriented to Situation Alcohol / Substance Use: Not Applicable Psych Involvement: No (comment)  Admission diagnosis:  Hyponatremia [E87.1] Syncope [R55] Injury of head, initial encounter [S09.90XA] Syncope, unspecified syncope type [R55] Patient Active Problem List   Diagnosis Date Noted  . Syncope 02/28/2020  . Essential hypertension 02/28/2020  . Hyponatremia    PCP:  Crist Infante, MD Pharmacy:   [id number] - [place], [address] [address] Phone: [phone] Fax: [phone]  Readmission Risk Interventions Readmission Risk Prevention Plan 03/01/2020  Post Dischage Appt Not Complete  Appt Comments rec for SNF  Medication Screening Complete  Transportation Screening Complete

## 2020-03-01 NOTE — Progress Notes (Addendum)
Progress Note  Patient Name: Holly Valenzuela [facility] Date of Encounter: 03/01/2020  [facility] Cardiologist: Donato Heinz, MD   Subjective   Was dizzy when being turned to clean this morning, poor PO intake. During my exam, she was working with PT. She complains of dizziness and headache, blurry vision and weakness. She has had poor oral intake recently, but no unintentional weight loss. BP runs in the 140-150s at home. Not orthostatic when she stood with PT from a sitting position.   Inpatient Medications    Scheduled Meds: . atorvastatin  10 mg Oral Daily  . enoxaparin (LOVENOX) injection  30 mg Subcutaneous QHS  . feeding supplement (ENSURE ENLIVE)  237 mL Oral TID BM  . influenza vaccine adjuvanted  0.5 mL Intramuscular Tomorrow-1000  . losartan  50 mg Oral Daily  . mirtazapine  7.5 mg Oral QHS  . senna-docusate  1 tablet Oral BID   Continuous Infusions: . sodium chloride 75 mL/hr at 03/01/20 0210  . cefTRIAXone (ROCEPHIN)  IV 1 g (03/01/20 0531)   PRN Meds: acetaminophen, ALPRAZolam, butalbital-acetaminophen-caffeine, meclizine, ondansetron **OR** ondansetron (ZOFRAN) IV   Vital Signs    Vitals:   02/29/20 0433 02/29/20 1148 02/29/20 2346 03/01/20 0431  BP: 136/87 (!) 141/76 (!) 159/85 (!) 143/85  Pulse: 72 60 60 63  Resp: 18 19 18 18   Temp: 99.1 F (37.3 C) 97.9 F (36.6 C) 98.3 F (36.8 C) 98.3 F (36.8 C)  TempSrc: Oral Oral Oral Oral  SpO2: 96% 97% 95% 93%  Weight:      Height:        Intake/Output Summary (Last 24 hours) at 03/01/2020 0928 Last data filed at 03/01/2020 0900 Gross per 24 hour  Intake 1695.56 ml  Output 900 ml  Net 795.56 ml   Last 3 Weights 02/29/2020 02/28/2020  Weight (lbs) 85 lb 8.6 oz 85 lb  Weight (kg) 38.8 kg 38.556 kg      Telemetry    Sinus rhythm - Personally Reviewed  ECG    No new tracings - Personally Reviewed  Physical Exam   GEN: very thin female, no acute distress.   Neck: No JVD Cardiac:  RRR, no murmurs, rubs, or gallops.  Respiratory: Clear to auscultation bilaterally. GI: Soft, nontender, non-distended  MS: No edema; No deformity. Neuro:  Nonfocal  Psych: Normal affect   Labs    High Sensitivity Troponin:  No results for input(s): TROPONINIHS in the last 720 hours.    Chemistry Recent Labs  Lab 02/28/20 2001 02/29/20 0707 03/01/20 0314  NA 129* 132* 132*  K 4.0 3.6 3.4*  CL 88* 96* 98  CO2 27 24 24   GLUCOSE 131* 96 98  BUN 10 6* 5*  CREATININE 0.91 0.76  0.71 0.65  CALCIUM 8.8* 8.3* 8.4*  PROT 6.6  --   --   ALBUMIN 3.9  --   --   AST 28  --   --   ALT 22  --   --   ALKPHOS 116  --   --   BILITOT 0.6  --   --   GFRNONAA 60* >60  >60 >60  GFRAA >60 >60  >60 >60  ANIONGAP 14 12 10      Hematology Recent Labs  Lab 02/28/20 2001 02/29/20 0707 03/01/20 0314  WBC 19.7* 12.2*  12.6* 10.5  RBC 4.59 4.29  4.28 4.18  HGB 15.1* 14.0  14.1 13.5  HCT 44.4 41.0  40.8 40.4  MCV 96.7 95.6  95.3 96.7  MCH 32.9 32.6  32.9 32.3  MCHC 34.0 34.1  34.6 33.4  RDW 13.8 14.0  14.0 14.2  PLT 299 269  269 258    BNPNo results for input(s): BNP, PROBNP in the last 168 hours.   DDimer No results for input(s): DDIMER in the last 168 hours.   Radiology    CT Head Wo Contrast  Result Date: 02/28/2020 CLINICAL DATA:  Fall EXAM: CT HEAD WITHOUT CONTRAST CT CERVICAL SPINE WITHOUT CONTRAST TECHNIQUE: Multidetector CT imaging of the head and cervical spine was performed following the standard protocol without intravenous contrast. Multiplanar CT image reconstructions of the cervical spine were also generated. COMPARISON:  None. FINDINGS: CT HEAD FINDINGS Brain: There is no mass, hemorrhage or extra-axial collection. There is generalized atrophy without lobar predilection. There is hypoattenuation of the periventricular white matter, most commonly indicating chronic ischemic microangiopathy. Vascular: No abnormal hyperdensity of the major intracranial arteries or  dural venous sinuses. No intracranial atherosclerosis. Skull: The visualized skull base, calvarium and extracranial soft tissues are normal. Sinuses/Orbits: No fluid levels or advanced mucosal thickening of the visualized paranasal sinuses. No mastoid or middle ear effusion. The orbits are normal. CT CERVICAL SPINE FINDINGS Alignment: Reversal of normal cervical lordosis centered at C5. Grade 1 anterolisthesis at C4-5. Grade 1 retrolisthesis at C5-6. Skull base and vertebrae: No acute fracture. Soft tissues and spinal canal: No prevertebral fluid or swelling. No visible canal hematoma. Disc levels: No advanced spinal canal or neural foraminal stenosis. Upper chest: No pneumothorax, pulmonary nodule or pleural effusion. Other: Normal visualized paraspinal cervical soft tissues. IMPRESSION: 1. Chronic ischemic microangiopathy and generalized volume loss without acute intracranial abnormality. 2. No acute fracture or static subluxation of the cervical spine. Electronically Signed   By: Ulyses Jarred M.D.   On: 02/28/2020 20:51   CT Cervical Spine Wo Contrast  Result Date: 02/28/2020 CLINICAL DATA:  Fall EXAM: CT HEAD WITHOUT CONTRAST CT CERVICAL SPINE WITHOUT CONTRAST TECHNIQUE: Multidetector CT imaging of the head and cervical spine was performed following the standard protocol without intravenous contrast. Multiplanar CT image reconstructions of the cervical spine were also generated. COMPARISON:  None. FINDINGS: CT HEAD FINDINGS Brain: There is no mass, hemorrhage or extra-axial collection. There is generalized atrophy without lobar predilection. There is hypoattenuation of the periventricular white matter, most commonly indicating chronic ischemic microangiopathy. Vascular: No abnormal hyperdensity of the major intracranial arteries or dural venous sinuses. No intracranial atherosclerosis. Skull: The visualized skull base, calvarium and extracranial soft tissues are normal. Sinuses/Orbits: No fluid levels or  advanced mucosal thickening of the visualized paranasal sinuses. No mastoid or middle ear effusion. The orbits are normal. CT CERVICAL SPINE FINDINGS Alignment: Reversal of normal cervical lordosis centered at C5. Grade 1 anterolisthesis at C4-5. Grade 1 retrolisthesis at C5-6. Skull base and vertebrae: No acute fracture. Soft tissues and spinal canal: No prevertebral fluid or swelling. No visible canal hematoma. Disc levels: No advanced spinal canal or neural foraminal stenosis. Upper chest: No pneumothorax, pulmonary nodule or pleural effusion. Other: Normal visualized paraspinal cervical soft tissues. IMPRESSION: 1. Chronic ischemic microangiopathy and generalized volume loss without acute intracranial abnormality. 2. No acute fracture or static subluxation of the cervical spine. Electronically Signed   By: Ulyses Jarred M.D.   On: 02/28/2020 20:51   DG Chest Port 1 View  Result Date: 02/28/2020 CLINICAL DATA:  Fall.  Syncope. EXAM: PORTABLE CHEST 1 VIEW COMPARISON:  04/17/2014 chest radiograph, 03/15/2018 chest CT FINDINGS: The lungs are hyperinflated with  diffuse interstitial prominence. No focal airspace consolidation or pulmonary edema. No pleural effusion or pneumothorax. Normal cardiomediastinal contours. IMPRESSION: COPD without acute airspace disease. Electronically Signed   By: Ulyses Jarred M.D.   On: 02/28/2020 20:35   ECHOCARDIOGRAM COMPLETE  Result Date: 02/29/2020    ECHOCARDIOGRAM REPORT   Patient Name:   NYKIA TURKO [facility] Date of Exam: 02/29/2020 Medical Rec #:  [id number]               Height:       65.0 in Accession #:    [id number]              Weight:       85.5 lb Date of Birth:  08-02-1940               BSA:          1.379 m Patient Age:    79 years                BP:           136/87 mmHg Patient Gender: F                       HR:           72 bpm. Exam Location:  Inpatient Procedure: 2D Echo Indications:    syncope 780.2  History:        Patient has no prior history of  Echocardiogram examinations.                 COPD, Signs/Symptoms:Syncope; Risk Factors:Current Smoker and                 Hypertension.  Sonographer:    Johny Chess Referring Phys: [place]  1. Left ventricular ejection fraction, by estimation, is 60 to 65%. The left ventricle has normal function. The left ventricle has no regional wall motion abnormalities. Left ventricular diastolic parameters are consistent with Grade I diastolic dysfunction (impaired relaxation).  2. Right ventricular systolic function is normal. The right ventricular size is normal.  3. The mitral valve is normal in structure. Trivial mitral valve regurgitation.  4. The aortic valve is tricuspid. There is mild calcification of the aortic valve. There is mild thickening of the aortic valve. Aortic valve regurgitation is not visualized.  5. The inferior vena cava is normal in size with <50% respiratory variability, suggesting right atrial pressure of 8 mmHg. Comparison(s): No prior Echocardiogram. FINDINGS  Left Ventricle: Left ventricular ejection fraction, by estimation, is 60 to 65%. The left ventricle has normal function. The left ventricle has no regional wall motion abnormalities. The left ventricular internal cavity size was normal in size. There is  no left ventricular hypertrophy. Left ventricular diastolic parameters are consistent with Grade I diastolic dysfunction (impaired relaxation). Right Ventricle: The right ventricular size is normal. Right vetricular wall thickness was not assessed. Right ventricular systolic function is normal. Left Atrium: Left atrial size was normal in size. Right Atrium: Right atrial size was normal in size. Pericardium: There is no evidence of pericardial effusion. Mitral Valve: The mitral valve is normal in structure. There is mild thickening of the mitral valve leaflet(s). There is mild calcification of the mitral valve leaflet(s). Mild mitral annular calcification.  Trivial mitral valve regurgitation. Tricuspid Valve: The tricuspid valve is normal in structure. Tricuspid valve regurgitation is trivial. Aortic Valve: The aortic valve is tricuspid. There is mild calcification of the aortic valve.  There is mild thickening of the aortic valve. Aortic valve regurgitation is not visualized. Pulmonic Valve: The pulmonic valve was normal in structure. Pulmonic valve regurgitation is mild. Aorta: The aortic root and ascending aorta are structurally normal, with no evidence of dilitation. Venous: The inferior vena cava is normal in size with less than 50% respiratory variability, suggesting right atrial pressure of 8 mmHg. IAS/Shunts: No atrial level shunt detected by color flow Doppler.  LEFT VENTRICLE PLAX 2D LVIDd:         3.60 cm  Diastology LVIDs:         2.70 cm  LV e' medial:    6.53 cm/s LV PW:         0.80 cm  LV E/e' medial:  10.0 LV IVS:        0.80 cm  LV e' lateral:   7.94 cm/s LVOT diam:     1.80 cm  LV E/e' lateral: 8.3 LV SV:         47 LV SV Index:   34 LVOT Area:     2.54 cm  RIGHT VENTRICLE             IVC RV S prime:     13.60 cm/s  IVC diam: 1.70 cm TAPSE (M-mode): 2.2 cm LEFT ATRIUM             Index       RIGHT ATRIUM          Index LA diam:        2.90 cm 2.10 cm/m  RA Area:     7.60 cm LA Vol (A2C):   22.6 ml 16.39 ml/m RA Volume:   12.00 ml 8.70 ml/m LA Vol (A4C):   24.4 ml 17.70 ml/m LA Biplane Vol: 25.0 ml 18.13 ml/m  AORTIC VALVE LVOT Vmax:   86.20 cm/s LVOT Vmean:  56.000 cm/s LVOT VTI:    0.184 m  AORTA Ao Root diam: 2.80 cm Ao Asc diam:  2.60 cm MITRAL VALVE MV Area (PHT): 2.56 cm    SHUNTS MV Decel Time: 296 msec    Systemic VTI:  0.18 m MV E velocity: 65.60 cm/s  Systemic Diam: 1.80 cm MV A velocity: 92.50 cm/s MV E/A ratio:  0.71 Gwyndolyn Kaufman MD Electronically signed by Gwyndolyn Kaufman MD Signature Date/Time: 02/29/2020/3:15:05 PM    Final     Cardiac Studies   Echo 02/29/20: 1. Left ventricular ejection fraction, by estimation, is  60 to 65%. The  left ventricle has normal function. The left ventricle has no regional  wall motion abnormalities. Left ventricular diastolic parameters are  consistent with Grade I diastolic  dysfunction (impaired relaxation).  2. Right ventricular systolic function is normal. The right ventricular  size is normal.  3. The mitral valve is normal in structure. Trivial mitral valve  regurgitation.  4. The aortic valve is tricuspid. There is mild calcification of the  aortic valve. There is mild thickening of the aortic valve. Aortic valve  regurgitation is not visualized.  5. The inferior vena cava is normal in size with <50% respiratory  variability, suggesting right atrial pressure of 8 mmHg.   Patient Profile     79 y.o. female with a hx of HTN, HLD, COPD with ongoing tobacco smoking and recurrent UTI (followed by urologist) who is being seen today for the evaluation of Syncope   Assessment & Plan    Syncope - no prodrome - telemetry overnight with sinus rhythm  - TSH,  Mg normal - K 3.4 - 30 day monitor ordered   Hyponatremia - Na 129 on admission, now 132   Positional dizziness - in the setting of poor PO intake - question dehydration vs UTI - was not orthostatic when standing from a sitting position with PT when I was in the room   Mild diastolic dysfunction - elevated right atrial pressure on echo - low sodium diet balanced with hyponatremia above   UA concerning for UTI - leukocytosis has resolved - culture in process - per primary   Hypertension - continue losartan - hypertensive in the 140-160s this morning - had losartan about 35 min prior   COPD Current smoker - needs to quit     [facility] will sign off.   Medication Recommendations:  No changes Other recommendations (labs, testing, etc):  30 day monitor on discharge Follow up as an outpatient:  Will schedule follow-up   For questions or updates, please contact [place] Please consult [email or web address] for contact info under     Signed, Ledora Bottcher, PA  03/01/2020, 9:28 AM    Patient seen and examined.  Agree with above documentation.  On exam, patient is alert and oriented, regular rate and rhythm, no murmurs, lungs CTAB, no LE edema or JVD.  Unclear cause of syncope.  No structural heart disease on echo.  Will plan cardiac monitor on discharge.  Donato Heinz, MD

## 2020-03-01 NOTE — Progress Notes (Signed)
PROGRESS NOTE    Holly Valenzuela [facility]  GEZ:[id number] DOB: Jul 07, 1940 DOA: 02/28/2020 PCP: Crist Infante, MD    Chief Complaint  Patient presents with  . Fall    Brief Narrative:  Chief Complaint: Loss of consciousness.  HPI: Holly Valenzuela is a 79 y.o. female with history of hypertension, hyperlipidemia, ongoing tobacco abuse, COPD was brought to the ER after patient had an episode of loss of consciousness.  Patient states she was back home from shopping at a store when she fell outside her house unconscious.  Patient states she was not having any dizziness shortness of breath or chest pain prior to the episode.  Patient states she may have been on the floor for at least 2 hours after which he regained consciousness and called her family and was brought to the ER.  Patient states her blood pressure medicines dose was recently increased.  ED Course: In the ER patient appeared nonfocal CT head and C-spine was unremarkable labs were significant for sodium of 129 WBC was 19.7 EKG was showing normal sinus rhythm with QTC of 486 ms.  Patient was started on gentle hydration and admitted for further observation.  Chest x-ray unremarkable Covid test was negative.   Subjective:  Feeling weak, denies pain Continue to have dizziness with positional change per RN When I asked patient to turn her head, she denies dizziness She is a poor historian  Assessment & Plan:   Principal Problem:   Syncope Active Problems:   Essential hypertension   Syncope with loss of consciousness during shopping  - unclear etiology -CT of head no acute findings, does show"Chronic ischemic microangiopathy and generalized volume loss without acute intracranial abnormality" -Telemetry has been unremarkable, cardiology consulted, plan for outpatient event monitor -Echo with preserved left ventricular EF -She is dehydrated on presentation with positive orthostatic vital signs -She could also have  UTI  Orthostatic hypotension due to dehydration Resolved after hydration  positional dizziness -Persistent dizziness after rehydrated -When standing up or being tired in bed -on exam I do not see any nystagmus, when I asked her to turn her head in bed she denies to feeling dizzy -Per family patient started having dizzy spells recently, PCP prescribed Antivert,  -We will get MRI of the brain due to persistent dizziness  Pseudomelenas UTI -With report of recurrent UTI recently, urine culture obtained after antibiotic given positive for Pseudomonas -DC Rocephin, start Zosyn  - follow-up urine culture  Leukocytosis: Could from UTI plus dehydration WBC 19.7 on presentation, WBC normalized today on October 1  Hyponatremia sodium 129 on presentation, likely due to dehydration, sodium improved to 132 today, DC hydration   encourage oral intake  Hypertension/hyperlipidemia: Continue home medication Cozaar and Lipitor  COPD longtime smoker (daughter report patient started smoking since she was 40, she continues smokes) No wheezing, lung exam clear  History of migraine, on as needed Fioricet Denies headache  Chronic constipation, Senokot  Underweight, will consult nutrition Patient report her weight has been stable Body mass index is 14.23 kg/m.  Failure to thrive: Patient reports to me that she walks daily she likes outdoor, but daughter state patient mostly stays at home watching TV PT eval recommend skilled nursing facility placement  DVT prophylaxis: enoxaparin (LOVENOX) injection 30 mg Start: 02/29/20 2200   Code Status: Full Family Communication: daughter over the phone Disposition:   Status is: inpatient   Dispo: The patient is from: Home  Anticipated d/c is to: Skilled nursing facility              Anticipated d/c date is: 24 to 48 hours              Patient currently not medically stable to discharge, awaiting for urine culture, getting MRI of the  head  Consultants:   Cardiology   Procedures:   None  Antimicrobials:   Rocephin     Objective: Vitals:   02/29/20 1148 02/29/20 2346 03/01/20 0431 03/01/20 1204  BP: (!) 141/76 (!) 159/85 (!) 143/85 (!) 155/87  Pulse: 60 60 63 72  Resp: 19 18 18 16   Temp: 97.9 F (36.6 C) 98.3 F (36.8 C) 98.3 F (36.8 C) 97.9 F (36.6 C)  TempSrc: Oral Oral Oral Oral  SpO2: 97% 95% 93% 96%  Weight:      Height:        Intake/Output Summary (Last 24 hours) at 03/01/2020 1219 Last data filed at 03/01/2020 0900 Gross per 24 hour  Intake 1695.56 ml  Output 300 ml  Net 1395.56 ml   Filed Weights   02/28/20 1958 02/29/20 0134  Weight: 38.6 kg 38.8 kg    Examination:  General exam: calm, NAD Respiratory system: Clear to auscultation. Respiratory effort normal. Cardiovascular system: S1 & S2 heard, RRR. No JVD, no murmur, No pedal edema. Gastrointestinal system: Abdomen is nondistended, soft and nontender. No organomegaly or masses felt. Normal bowel sounds heard. Central nervous system: Alert and oriented. No focal neurological deficits. Extremities: Symmetric 5 x 5 power. Skin: No rashes, lesions or ulcers Psychiatry: Judgement and insight appear normal. Mood & affect appropriate.     Data Reviewed: I have personally reviewed following labs and imaging studies  CBC: Recent Labs  Lab 02/28/20 2001 02/29/20 0707 03/01/20 0314  WBC 19.7* 12.2*  12.6* 10.5  NEUTROABS 17.4*  --  6.5  HGB 15.1* 14.0  14.1 13.5  HCT 44.4 41.0  40.8 40.4  MCV 96.7 95.6  95.3 96.7  PLT 299 269  269 700    Basic Metabolic Panel: Recent Labs  Lab 02/28/20 2001 02/29/20 0707 03/01/20 0314  NA 129* 132* 132*  K 4.0 3.6 3.4*  CL 88* 96* 98  CO2 27 24 24   GLUCOSE 131* 96 98  BUN 10 6* 5*  CREATININE 0.91 0.76  0.71 0.65  CALCIUM 8.8* 8.3* 8.4*  MG  --   --  2.0    GFR: Estimated Creatinine Clearance: 34.4 mL/min (by C-G formula based on SCr of 0.65 mg/dL).  Liver  Function Tests: Recent Labs  Lab 02/28/20 2001  AST 28  ALT 22  ALKPHOS 116  BILITOT 0.6  PROT 6.6  ALBUMIN 3.9    CBG: No results for input(s): GLUCAP in the last 168 hours.   Recent Results (from the past 240 hour(s))  Respiratory Panel by RT PCR (Flu A&B, Covid) - Nasopharyngeal Swab     Status: None   Collection Time: 02/28/20  8:06 PM   Specimen: Nasopharyngeal Swab  Result Value Ref Range Status   SARS Coronavirus 2 by RT PCR NEGATIVE NEGATIVE Final    Comment: (NOTE) SARS-CoV-2 target nucleic acids are NOT DETECTED.  The SARS-CoV-2 RNA is generally detectable in upper respiratoy specimens during the acute phase of infection. The lowest concentration of SARS-CoV-2 viral copies this assay can detect is 131 copies/mL. A negative result does not preclude SARS-Cov-2 infection and should not be used as the sole basis for treatment or  other patient management decisions. A negative result may occur with  improper specimen collection/handling, submission of specimen other than nasopharyngeal swab, presence of viral mutation(s) within the areas targeted by this assay, and inadequate number of viral copies (<131 copies/mL). A negative result must be combined with clinical observations, patient history, and epidemiological information. The expected result is Negative.  Fact Sheet for Patients:  PinkCheek.be  Fact Sheet for Healthcare Providers:  GravelBags.it  This test is no t yet approved or cleared by the [place] FDA and  has been authorized for detection and/or diagnosis of SARS-CoV-2 by FDA under an Emergency Use Authorization (EUA). This EUA will remain  in effect (meaning this test can be used) for the duration of the COVID-19 declaration under Section 564(b)(1) of the Act, 21 U.S.C. section 360bbb-3(b)(1), unless the authorization is terminated or revoked sooner.     Influenza A by PCR NEGATIVE  NEGATIVE Final   Influenza B by PCR NEGATIVE NEGATIVE Final    Comment: (NOTE) The Xpert Xpress SARS-CoV-2/FLU/RSV assay is intended as an aid in  the diagnosis of influenza from Nasopharyngeal swab specimens and  should not be used as a sole basis for treatment. Nasal washings and  aspirates are unacceptable for Xpert Xpress SARS-CoV-2/FLU/RSV  testing.  Fact Sheet for Patients: PinkCheek.be  Fact Sheet for Healthcare Providers: GravelBags.it  This test is not yet approved or cleared by the [place] FDA and  has been authorized for detection and/or diagnosis of SARS-CoV-2 by  FDA under an Emergency Use Authorization (EUA). This EUA will remain  in effect (meaning this test can be used) for the duration of the  Covid-19 declaration under Section 564(b)(1) of the Act, 21  U.S.C. section 360bbb-3(b)(1), unless the authorization is  terminated or revoked. Performed at [facility], [place] [address], [place], [place] [address]   Culture, Urine     Status: Abnormal (Preliminary result)   Collection Time: 02/29/20  3:33 PM   Specimen: Urine, Random  Result Value Ref Range Status   Specimen Description URINE, RANDOM  Final   Special Requests NONE  Final   Culture (A)  Final    40,000 COLONIES/mL PSEUDOMONAS AERUGINOSA SUSCEPTIBILITIES TO FOLLOW Performed at [facility], [place] [address], [place], [place] [address]    Report Status PENDING  Incomplete         Radiology Studies: CT Head Wo Contrast  Result Date: 02/28/2020 CLINICAL DATA:  Fall EXAM: CT HEAD WITHOUT CONTRAST CT CERVICAL SPINE WITHOUT CONTRAST TECHNIQUE: Multidetector CT imaging of the head and cervical spine was performed following the standard protocol without intravenous contrast. Multiplanar CT image reconstructions of the cervical spine were also generated. COMPARISON:  None. FINDINGS: CT HEAD FINDINGS Brain: There is no mass, hemorrhage or  extra-axial collection. There is generalized atrophy without lobar predilection. There is hypoattenuation of the periventricular white matter, most commonly indicating chronic ischemic microangiopathy. Vascular: No abnormal hyperdensity of the major intracranial arteries or dural venous sinuses. No intracranial atherosclerosis. Skull: The visualized skull base, calvarium and extracranial soft tissues are normal. Sinuses/Orbits: No fluid levels or advanced mucosal thickening of the visualized paranasal sinuses. No mastoid or middle ear effusion. The orbits are normal. CT CERVICAL SPINE FINDINGS Alignment: Reversal of normal cervical lordosis centered at C5. Grade 1 anterolisthesis at C4-5. Grade 1 retrolisthesis at C5-6. Skull base and vertebrae: No acute fracture. Soft tissues and spinal canal: No prevertebral fluid or swelling. No visible canal hematoma. Disc levels: No advanced spinal  canal or neural foraminal stenosis. Upper chest: No pneumothorax, pulmonary nodule or pleural effusion. Other: Normal visualized paraspinal cervical soft tissues. IMPRESSION: 1. Chronic ischemic microangiopathy and generalized volume loss without acute intracranial abnormality. 2. No acute fracture or static subluxation of the cervical spine. Electronically Signed   By: Ulyses Jarred M.D.   On: 02/28/2020 20:51   CT Cervical Spine Wo Contrast  Result Date: 02/28/2020 CLINICAL DATA:  Fall EXAM: CT HEAD WITHOUT CONTRAST CT CERVICAL SPINE WITHOUT CONTRAST TECHNIQUE: Multidetector CT imaging of the head and cervical spine was performed following the standard protocol without intravenous contrast. Multiplanar CT image reconstructions of the cervical spine were also generated. COMPARISON:  None. FINDINGS: CT HEAD FINDINGS Brain: There is no mass, hemorrhage or extra-axial collection. There is generalized atrophy without lobar predilection. There is hypoattenuation of the periventricular white matter, most commonly indicating chronic  ischemic microangiopathy. Vascular: No abnormal hyperdensity of the major intracranial arteries or dural venous sinuses. No intracranial atherosclerosis. Skull: The visualized skull base, calvarium and extracranial soft tissues are normal. Sinuses/Orbits: No fluid levels or advanced mucosal thickening of the visualized paranasal sinuses. No mastoid or middle ear effusion. The orbits are normal. CT CERVICAL SPINE FINDINGS Alignment: Reversal of normal cervical lordosis centered at C5. Grade 1 anterolisthesis at C4-5. Grade 1 retrolisthesis at C5-6. Skull base and vertebrae: No acute fracture. Soft tissues and spinal canal: No prevertebral fluid or swelling. No visible canal hematoma. Disc levels: No advanced spinal canal or neural foraminal stenosis. Upper chest: No pneumothorax, pulmonary nodule or pleural effusion. Other: Normal visualized paraspinal cervical soft tissues. IMPRESSION: 1. Chronic ischemic microangiopathy and generalized volume loss without acute intracranial abnormality. 2. No acute fracture or static subluxation of the cervical spine. Electronically Signed   By: Ulyses Jarred M.D.   On: 02/28/2020 20:51   DG Chest Port 1 View  Result Date: 02/28/2020 CLINICAL DATA:  Fall.  Syncope. EXAM: PORTABLE CHEST 1 VIEW COMPARISON:  04/17/2014 chest radiograph, 03/15/2018 chest CT FINDINGS: The lungs are hyperinflated with diffuse interstitial prominence. No focal airspace consolidation or pulmonary edema. No pleural effusion or pneumothorax. Normal cardiomediastinal contours. IMPRESSION: COPD without acute airspace disease. Electronically Signed   By: Ulyses Jarred M.D.   On: 02/28/2020 20:35   ECHOCARDIOGRAM COMPLETE  Result Date: 02/29/2020    ECHOCARDIOGRAM REPORT   Patient Name:   [facility] Date of Exam: 02/29/2020 Medical Rec #:  [id number]               Height:       65.0 in Accession #:    [id number]              Weight:       85.5 lb Date of Birth:  [birth year]               BSA:           1.379 m Patient Age:    34 years                BP:           136/87 mmHg Patient Gender: F                       HR:           72 bpm. Exam Location:  Inpatient Procedure: 2D Echo Indications:    syncope 780.2  History:        Patient has no prior history of  Echocardiogram examinations.                 COPD, Signs/Symptoms:Syncope; Risk Factors:Current Smoker and                 Hypertension.  Sonographer:    Johny Chess Referring Phys: [place]  1. Left ventricular ejection fraction, by estimation, is 60 to 65%. The left ventricle has normal function. The left ventricle has no regional wall motion abnormalities. Left ventricular diastolic parameters are consistent with Grade I diastolic dysfunction (impaired relaxation).  2. Right ventricular systolic function is normal. The right ventricular size is normal.  3. The mitral valve is normal in structure. Trivial mitral valve regurgitation.  4. The aortic valve is tricuspid. There is mild calcification of the aortic valve. There is mild thickening of the aortic valve. Aortic valve regurgitation is not visualized.  5. The inferior vena cava is normal in size with <50% respiratory variability, suggesting right atrial pressure of 8 mmHg. Comparison(s): No prior Echocardiogram. FINDINGS  Left Ventricle: Left ventricular ejection fraction, by estimation, is 60 to 65%. The left ventricle has normal function. The left ventricle has no regional wall motion abnormalities. The left ventricular internal cavity size was normal in size. There is  no left ventricular hypertrophy. Left ventricular diastolic parameters are consistent with Grade I diastolic dysfunction (impaired relaxation). Right Ventricle: The right ventricular size is normal. Right vetricular wall thickness was not assessed. Right ventricular systolic function is normal. Left Atrium: Left atrial size was normal in size. Right Atrium: Right atrial size was normal in size.  Pericardium: There is no evidence of pericardial effusion. Mitral Valve: The mitral valve is normal in structure. There is mild thickening of the mitral valve leaflet(s). There is mild calcification of the mitral valve leaflet(s). Mild mitral annular calcification. Trivial mitral valve regurgitation. Tricuspid Valve: The tricuspid valve is normal in structure. Tricuspid valve regurgitation is trivial. Aortic Valve: The aortic valve is tricuspid. There is mild calcification of the aortic valve. There is mild thickening of the aortic valve. Aortic valve regurgitation is not visualized. Pulmonic Valve: The pulmonic valve was normal in structure. Pulmonic valve regurgitation is mild. Aorta: The aortic root and ascending aorta are structurally normal, with no evidence of dilitation. Venous: The inferior vena cava is normal in size with less than 50% respiratory variability, suggesting right atrial pressure of 8 mmHg. IAS/Shunts: No atrial level shunt detected by color flow Doppler.  LEFT VENTRICLE PLAX 2D LVIDd:         3.60 cm  Diastology LVIDs:         2.70 cm  LV e' medial:    6.53 cm/s LV PW:         0.80 cm  LV E/e' medial:  10.0 LV IVS:        0.80 cm  LV e' lateral:   7.94 cm/s LVOT diam:     1.80 cm  LV E/e' lateral: 8.3 LV SV:         47 LV SV Index:   34 LVOT Area:     2.54 cm  RIGHT VENTRICLE             IVC RV S prime:     13.60 cm/s  IVC diam: 1.70 cm TAPSE (M-mode): 2.2 cm LEFT ATRIUM             Index       RIGHT ATRIUM  Index LA diam:        2.90 cm 2.10 cm/m  RA Area:     7.60 cm LA Vol (A2C):   22.6 ml 16.39 ml/m RA Volume:   12.00 ml 8.70 ml/m LA Vol (A4C):   24.4 ml 17.70 ml/m LA Biplane Vol: 25.0 ml 18.13 ml/m  AORTIC VALVE LVOT Vmax:   86.20 cm/s LVOT Vmean:  56.000 cm/s LVOT VTI:    0.184 m  AORTA Ao Root diam: 2.80 cm Ao Asc diam:  2.60 cm MITRAL VALVE MV Area (PHT): 2.56 cm    SHUNTS MV Decel Time: 296 msec    Systemic VTI:  0.18 m MV E velocity: 65.60 cm/s  Systemic Diam: 1.80 cm  MV A velocity: 92.50 cm/s MV E/A ratio:  0.71 Gwyndolyn Kaufman MD Electronically signed by Gwyndolyn Kaufman MD Signature Date/Time: 02/29/2020/3:15:05 PM    Final         Scheduled Meds: . atorvastatin  10 mg Oral Daily  . enoxaparin (LOVENOX) injection  30 mg Subcutaneous QHS  . feeding supplement (ENSURE ENLIVE)  237 mL Oral TID BM  . influenza vaccine adjuvanted  0.5 mL Intramuscular Tomorrow-1000  . losartan  50 mg Oral Daily  . mirtazapine  7.5 mg Oral QHS  . senna-docusate  1 tablet Oral BID   Continuous Infusions:    LOS: 1 day   Time spent: 35 mins Greater than 50% of this time was spent in counseling, explanation of diagnosis, planning of further management, and coordination of care.  I have personally reviewed and interpreted on  03/01/2020 daily labs, tele strips, imagings as discussed above under date review session and assessment and plans.  I reviewed all nursing notes, pharmacy notes, consultant note,  vitals, pertinent old records  I have discussed plan of care as described above with RN , patient and family on 03/01/2020  Voice Recognition /Dragon dictation system was used to create this note, attempts have been made to correct errors. Please contact the author with questions and/or clarifications.   Florencia Reasons, MD PhD FACP [facility]  Available via Epic secure chat 7am-7pm for nonurgent issues Please page for urgent issues To page the attending provider between 7A-7P or the covering provider during after hours 7P-7A, please log into the web site [email or web address] and access using universal [place] password for that web site. If you do not have the password, please call the hospital operator.    03/01/2020, 12:19 PM

## 2020-03-01 NOTE — Progress Notes (Signed)
Physical Therapy Treatment Patient Details Name: Holly Valenzuela MRN: [id number] DOB: [birth year] Today's Date: 03/01/2020    History of Present Illness The pt is an 79 yo female presenting after a syncopal episode resulting in a fall and being down ~2 hours prior to regaining consciousness and calling for assistance. The pt is undergoing continued work-up to determine the cause of the syncope and possible UTI. PMH includes: HTN, migraines.    PT Comments    The pt is continuing to make very slow progress towards established PT goals for mobility at this time. She was able to tolerate sitting EOB for longer duration, and was able to complete OOB transfers and short lateral steps at the EOB. The pt was again limited by elevated BP with changes in position (see below) with reports of dizziness, headache, and slightly blurred vision with the changes in position. The pt requires minA to complete mobility and maintain stability at this time, and therefore due to significant limitations in activity tolerance and no supervision or support at home, I continue to recommend d/c to SNF for continued rehab and assist.   VITALS:  - supine: BP: 151/88 (106); HR: 56 bpm - sitting EOB: BP: 170/92 (117); HR: 74 bpm - sitting EOB: BP: 161/107 (122); HR: 73 bpm - standing: BP: 148/98 (112) - sitting EOB: BP: 166/107 (120); HR: 80 bpm - supine: BP: 142/90 (106); HR: 55 bpm   Follow Up Recommendations  SNF;Supervision/Assistance - 24 hour     Equipment Recommendations  None recommended by PT    Recommendations for Other Services OT consult     Precautions / Restrictions Precautions Precautions: Fall Precaution Comments: admitted following fall at home. watch BP Restrictions Weight Bearing Restrictions: No    Mobility  Bed Mobility Overal bed mobility: Needs Assistance Bed Mobility: Supine to Sit;Sit to Supine     Supine to sit: Supervision;HOB elevated Sit to supine: Supervision    General bed mobility comments: pt able to come to sitting EOB without assist, reports dizziness, headache, and slight blurriness of vision with sitting but gradually subsides. minA iniitially to maintain balance in sitting  Transfers Overall transfer level: Needs assistance Equipment used: 1 person hand held assist Transfers: Sit to/from Stand Sit to Stand: Min assist         General transfer comment: minA to power up and steady, could use RW for stability but no RW available  Ambulation/Gait Ambulation/Gait assistance: Min assist Gait Distance (Feet): 3 Feet Assistive device: 1 person hand held assist Gait Pattern/deviations: Step-to pattern;Shuffle Gait velocity: decreased Gait velocity interpretation: <1.31 ft/sec, indicative of household ambulator General Gait Details: small lateral steps to [facility], HHA of 1 to steady but pt also taking very small steps for improved stability.       Balance Overall balance assessment: Needs assistance Sitting-balance support: No upper extremity supported;Feet supported Sitting balance-Leahy Scale: Good     Standing balance support: Bilateral upper extremity supported Standing balance-Leahy Scale: Poor Standing balance comment: reliant on BUE support and minA to steady                            Cognition Arousal/Alertness: Awake/alert Behavior During Therapy: WFL for tasks assessed/performed Overall Cognitive Status: No family/caregiver present to determine baseline cognitive functioning  General Comments: Pt answering questions appropriately through session, some impulsive movements, but able to be redirected      Exercises      General Comments General comments (skin integrity, edema, etc.): cardiology PA present for standing activity, BP increased from 151/88 (supine) to 161/107 (sitting) and then dropped to 1148/88 in standing      Pertinent Vitals/Pain Pain Assessment:  Faces Faces Pain Scale: Hurts little more Pain Location: headache with changes in position Pain Descriptors / Indicators: Headache;Dull;Grimacing Pain Intervention(s): Limited activity within patient's tolerance;Monitored during session           PT Goals (current goals can now be found in the care plan section) Acute Rehab PT Goals Patient Stated Goal: to return home PT Goal Formulation: With patient Time For Goal Achievement: 03/14/20 Potential to Achieve Goals: Good Progress towards PT goals: Progressing toward goals    Frequency    Min 2X/week      PT Plan Current plan remains appropriate       AM-PAC PT "6 Clicks" Mobility   Outcome Measure  Help needed turning from your back to your side while in a flat bed without using bedrails?: None Help needed moving from lying on your back to sitting on the side of a flat bed without using bedrails?: None Help needed moving to and from a bed to a chair (including a wheelchair)?: A Little Help needed standing up from a chair using your arms (e.g., wheelchair or bedside chair)?: A Little Help needed to walk in hospital room?: A Lot Help needed climbing 3-5 steps with a railing? : A Lot 6 Click Score: 18    End of Session Equipment Utilized During Treatment: Gait belt Activity Tolerance: Patient limited by fatigue;No increased pain;Other (comment) (dizzy, nausea, BP elevation) Patient left: in bed;with bed alarm set (chair position in bed) Nurse Communication: Mobility status PT Visit Diagnosis: Difficulty in walking, not elsewhere classified (R26.2);Muscle weakness (generalized) (M62.81)     Time: 1219-7588 PT Time Calculation (min) (ACUTE ONLY): 29 min  Charges:  $Gait Training: 8-22 mins $Therapeutic Activity: 8-22 mins                     Karma Ganja, PT, DPT   Acute Rehabilitation Department Pager #: [phone]   Otho Bellows 03/01/2020, 10:24 AM

## 2020-03-01 NOTE — Progress Notes (Signed)
Pharmacy Antibiotic Note  Holly Valenzuela is a 79 y.o. female admitted on 02/28/2020 with syncope.  Pharmacy has been consulted for Zosyn dosing for pseudomonas UTI.  Plan: Zosyn 3.375 g IV q8hr  Height: 5\' 5"  (165.1 cm) Weight: 38.8 kg (85 lb 8.6 oz) IBW/kg (Calculated) : 57  Temp (24hrs), Avg:98.2 F (36.8 C), Min:97.9 F (36.6 C), Max:98.3 F (36.8 C)  Recent Labs  Lab 02/28/20 2001 02/29/20 0707 03/01/20 0314  WBC 19.7* 12.2*  12.6* 10.5  CREATININE 0.91 0.76  0.71 0.65    Estimated Creatinine Clearance: 34.4 mL/min (by C-G formula based on SCr of 0.65 mg/dL).    No Known Allergies Microbiology results: 9/30 UCx  40,000 col/ml pseudomonas aeruginosa 9/29 COVID: negative   Thank you for allowing pharmacy to be a part of this patient's care.  Nicole Cella, RPh Clinical Pharmacist [phone] Please check AMION for all [place] phone numbers After 10:00 PM, call [place] [phone]  03/01/2020 12:26 PM

## 2020-03-01 NOTE — Progress Notes (Signed)
When cleaning patient this morning, she complained of being severely dizzy when turning. She has poor food intake and would not eat breakfast.

## 2020-03-02 ENCOUNTER — Encounter (HOSPITAL_COMMUNITY): Payer: Self-pay | Admitting: Internal Medicine

## 2020-03-02 LAB — URINE CULTURE: Culture: 40000 — AB

## 2020-03-02 MED ORDER — SODIUM CHLORIDE 0.9 % IV SOLN
1.0000 g | Freq: Two times a day (BID) | INTRAVENOUS | Status: DC
  Administered 2020-03-02 – 2020-03-04 (×5): 1 g via INTRAVENOUS
  Filled 2020-03-02 (×8): qty 1

## 2020-03-02 NOTE — Progress Notes (Signed)
PROGRESS NOTE    [facility]  MVE:[id number] DOB: 20-Apr-1941 DOA: 02/28/2020 PCP: Crist Infante, MD    Chief Complaint  Patient presents with   Fall    Brief Narrative:  Chief Complaint: Loss of consciousness.  HPI: Holly Valenzuela is a 79 y.o. female with history of hypertension, hyperlipidemia, ongoing tobacco abuse, COPD was brought to the ER after patient had an episode of loss of consciousness.  Patient states she was back home from shopping at a store when she fell outside her house unconscious.  Patient states she was not having any dizziness shortness of breath or chest pain prior to the episode.  Patient states she may have been on the floor for at least 2 hours after which he regained consciousness and called her family and was brought to the ER.  Patient states her blood pressure medicines dose was recently increased.  ED Course: In the ER patient appeared nonfocal CT head and C-spine was unremarkable labs were significant for sodium of 129 WBC was 19.7 EKG was showing normal sinus rhythm with QTC of 486 ms.  Patient was started on gentle hydration and admitted for further observation.  Chest x-ray unremarkable Covid test was negative.   Subjective:  Feeling weak, denies pain Continue to c/o dizziness with positional change   She is a poor historian  Assessment & Plan:   Principal Problem:   Syncope Active Problems:   Essential hypertension   Syncope with loss of consciousness during shopping  - unclear etiology -CT of head no acute findings, does show"Chronic ischemic microangiopathy and generalized volume loss without acute intracranial abnormality" -Telemetry has been unremarkable, cardiology consulted, plan for outpatient event monitor -Echo with preserved left ventricular EF -She is dehydrated on presentation with positive orthostatic vital signs -She could also have UTI  Orthostatic hypotension due to dehydration Resolved after  hydration  positional dizziness -Persistent dizziness after rehydrated -When standing up or being tired in bed -on exam I do not see any nystagmus, when I asked her to turn her head in bed she denies to feeling dizzy -Per family patient started having dizzy spells recently, PCP prescribed Antivert,  -MRI of the brain due to persistent dizziness no acute event -due to persistent symptom, case discussed with neurology Dr Theda Sers who graciously accepted the consult, will see patient today  Pseudomelenas UTI -With report of recurrent UTI recently, urine culture obtained after antibiotic given positive for Pseudomonas, sensitive to [place] and ciprofloxacin Zosyn cefepime imipenem -DC Rocephin, changed Zosyn to [place],    Leukocytosis: Could from UTI plus dehydration WBC 19.7 on presentation, WBC normalized today on October 1  Hyponatremia sodium 129 on presentation, likely due to dehydration, sodium improved to 132 today, DC hydration   encourage oral intake  Hypokalemia, replaced, repeat lab in the morning  Hypertension/hyperlipidemia: Continue home medication Cozaar and Lipitor  COPD longtime smoker (daughter report patient started smoking since she was 81, she continues smokes) No wheezing, lung exam clear  History of migraine, on as needed Fioricet Denies headache  Chronic constipation, Senokot  Underweight, will consult nutrition Patient report her weight has been stable Body mass index is 14.23 kg/m.  Failure to thrive: Patient reports to me that she walks daily she likes outdoor, but daughter state patient mostly stays at home watching TV PT eval recommend skilled nursing facility placement  Impaired memory: Patient denies Immediate recall 2 out of 3, slightly confused about the month.  DVT prophylaxis: enoxaparin (LOVENOX) injection 30 mg Start:  02/29/20 2200   Code Status: Full Family Communication: daughter over the phone Disposition:   Status is:  inpatient   Dispo: The patient is from: Home              Anticipated d/c is to: Skilled nursing facility              Anticipated d/c date is: 24 to 48 hours              Patient currently not medically stable to discharge, need neurology consult due to persistent dizziness  Consultants:   Cardiology  Cardiology   Procedures:   None  Antimicrobials:   Rocephin then Zosyn then Fortaz     Objective: Vitals:   03/01/20 1204 03/01/20 2338 03/02/20 0438 03/02/20 0841  BP: (!) 155/87 (!) 146/96 (!) 137/92 (!) 122/95  Pulse: 72 75 79 72  Resp: 16 16 18    Temp: 97.9 F (36.6 C) 97.8 F (36.6 C) 98.5 F (36.9 C)   TempSrc: Oral Oral Oral   SpO2: 96% 94% 93%   Weight:      Height:        Intake/Output Summary (Last 24 hours) at 03/02/2020 1117 Last data filed at 03/02/2020 0845 Gross per 24 hour  Intake 2312.56 ml  Output 1150 ml  Net 1162.56 ml   Filed Weights   02/28/20 1958 02/29/20 0134  Weight: 38.6 kg 38.8 kg    Examination:  General exam: calm, NAD Respiratory system: Clear to auscultation. Respiratory effort normal. Cardiovascular system: S1 & S2 heard, RRR. No JVD, no murmur, No pedal edema. Gastrointestinal system: Abdomen is nondistended, soft and nontender. No organomegaly or masses felt. Normal bowel sounds heard. Central nervous system: Alert and oriented. No focal neurological deficits.  Immediate recall 2 out of 3 Extremities: Symmetric 5 x 5 power. Skin: No rashes, lesions or ulcers Psychiatry: Judgement and insight appear normal. Mood & affect appropriate.     Data Reviewed: I have personally reviewed following labs and imaging studies  CBC: Recent Labs  Lab 02/28/20 2001 02/29/20 0707 03/01/20 0314  WBC 19.7* 12.2*   12.6* 10.5  NEUTROABS 17.4*  --  6.5  HGB 15.1* 14.0   14.1 13.5  HCT 44.4 41.0   40.8 40.4  MCV 96.7 95.6   95.3 96.7  PLT 299 269   269 324    Basic Metabolic Panel: Recent Labs  Lab 02/28/20 2001  02/29/20 0707 03/01/20 0314  NA 129* 132* 132*  K 4.0 3.6 3.4*  CL 88* 96* 98  CO2 27 24 24   GLUCOSE 131* 96 98  BUN 10 6* 5*  CREATININE 0.91 0.76   0.71 0.65  CALCIUM 8.8* 8.3* 8.4*  MG  --   --  2.0    GFR: Estimated Creatinine Clearance: 34.4 mL/min (by C-G formula based on SCr of 0.65 mg/dL).  Liver Function Tests: Recent Labs  Lab 02/28/20 2001  AST 28  ALT 22  ALKPHOS 116  BILITOT 0.6  PROT 6.6  ALBUMIN 3.9    CBG: No results for input(s): GLUCAP in the last 168 hours.   Recent Results (from the past 240 hour(s))  Respiratory Panel by RT PCR (Flu A&B, Covid) - Nasopharyngeal Swab     Status: None   Collection Time: 02/28/20  8:06 PM   Specimen: Nasopharyngeal Swab  Result Value Ref Range Status   SARS Coronavirus 2 by RT PCR NEGATIVE NEGATIVE Final    Comment: (NOTE) SARS-CoV-2 target nucleic acids  are NOT DETECTED.  The SARS-CoV-2 RNA is generally detectable in upper respiratoy specimens during the acute phase of infection. The lowest concentration of SARS-CoV-2 viral copies this assay can detect is 131 copies/mL. A negative result does not preclude SARS-Cov-2 infection and should not be used as the sole basis for treatment or other patient management decisions. A negative result may occur with  improper specimen collection/handling, submission of specimen other than nasopharyngeal swab, presence of viral mutation(s) within the areas targeted by this assay, and inadequate number of viral copies (<131 copies/mL). A negative result must be combined with clinical observations, patient history, and epidemiological information. The expected result is Negative.  Fact Sheet for Patients:  PinkCheek.be  Fact Sheet for Healthcare Providers:  GravelBags.it  This test is no t yet approved or cleared by the [place] FDA and  has been authorized for detection and/or diagnosis of SARS-CoV-2 by FDA  under an Emergency Use Authorization (EUA). This EUA will remain  in effect (meaning this test can be used) for the duration of the COVID-19 declaration under Section 564(b)(1) of the Act, 21 U.S.C. section 360bbb-3(b)(1), unless the authorization is terminated or revoked sooner.     Influenza A by PCR NEGATIVE NEGATIVE Final   Influenza B by PCR NEGATIVE NEGATIVE Final    Comment: (NOTE) The Xpert Xpress SARS-CoV-2/FLU/RSV assay is intended as an aid in  the diagnosis of influenza from Nasopharyngeal swab specimens and  should not be used as a sole basis for treatment. Nasal washings and  aspirates are unacceptable for Xpert Xpress SARS-CoV-2/FLU/RSV  testing.  Fact Sheet for Patients: PinkCheek.be  Fact Sheet for Healthcare Providers: GravelBags.it  This test is not yet approved or cleared by the [place] FDA and  has been authorized for detection and/or diagnosis of SARS-CoV-2 by  FDA under an Emergency Use Authorization (EUA). This EUA will remain  in effect (meaning this test can be used) for the duration of the  Covid-19 declaration under Section 564(b)(1) of the Act, 21  U.S.C. section 360bbb-3(b)(1), unless the authorization is  terminated or revoked. Performed at [facility], [place] [address] [place], [place] [address]   Culture, Urine     Status: Abnormal   Collection Time: 02/29/20  3:33 PM   Specimen: Urine, Random  Result Value Ref Range Status   Specimen Description URINE, RANDOM  Final   Special Requests   Final    NONE Performed at [facility], [place] [address], [place], [place] [address]    Culture 40,000 COLONIES/mL PSEUDOMONAS AERUGINOSA (A)  Final   Report Status 03/02/2020 FINAL  Final   Organism ID, Bacteria PSEUDOMONAS AERUGINOSA (A)  Final      Susceptibility   Pseudomonas aeruginosa - MIC*    CEFTAZIDIME <=1 SENSITIVE Sensitive     CIPROFLOXACIN <=0.25 SENSITIVE Sensitive      GENTAMICIN <=1 SENSITIVE Sensitive     IMIPENEM 2 SENSITIVE Sensitive     PIP/TAZO 8 SENSITIVE Sensitive     CEFEPIME 1 SENSITIVE Sensitive     * 40,000 COLONIES/mL PSEUDOMONAS AERUGINOSA         Radiology Studies: MR BRAIN WO CONTRAST  Result Date: 03/01/2020 CLINICAL DATA:  79 year old female with dizziness. Episode of loss of consciousness. EXAM: MRI HEAD WITHOUT CONTRAST TECHNIQUE: Multiplanar, multiecho pulse sequences of the brain and surrounding structures were obtained without intravenous contrast. COMPARISON:  Head CT 02/28/2020. FINDINGS: Brain: Stable cerebral volume. No restricted diffusion to suggest acute infarction. No midline  shift, mass effect, evidence of mass lesion, ventriculomegaly, extra-axial collection or acute intracranial hemorrhage. Cervicomedullary junction and pituitary are within normal limits. Widespread, confluent bilateral cerebral white matter T2 and FLAIR hyperintensity, with similar signal changes in the pons. Comparatively mild T2 heterogeneity in the bilateral deep gray nuclei, relatively sparing the right thalamus. No cortical encephalomalacia identified. There is a solitary chronic microhemorrhage in the left parietal lobe (series 8, image 18). Negative cerebellum. Vascular: Major intracranial vascular flow voids are preserved. Fetal type right PCA origin suspected (normal variant). Skull and upper cervical spine: Mild for age visible cervical spine degeneration, capacious spinal canal. Visualized bone marrow signal is within normal limits. Sinuses/Orbits: Negative. Other: Mastoids are clear. Visible internal auditory structures appear normal. Scalp and face appear negative. IMPRESSION: 1. No acute intracranial abnormality. 2. 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 Electronically Signed   By: Genevie Ann M.D.   On: 03/01/2020 14:41        Scheduled Meds:  atorvastatin  10 mg Oral Daily   enoxaparin  (LOVENOX) injection  30 mg Subcutaneous QHS   feeding supplement (ENSURE ENLIVE)  237 mL Oral TID BM   losartan  50 mg Oral Daily   mirtazapine  7.5 mg Oral QHS   senna-docusate  1 tablet Oral BID   Continuous Infusions:  cefTAZidime (FORTAZ)  IV       LOS: 2 days   Time spent: 35 mins Greater than 50% of this time was spent in counseling, explanation of diagnosis, planning of further management, and coordination of care.  I have personally reviewed and interpreted on  03/02/2020 daily labs, tele strips, imagings as discussed above under date review session and assessment and plans.  I reviewed all nursing notes, pharmacy notes, consultant note,  vitals, pertinent old records  I have discussed plan of care as described above with RN , patient and family on 03/02/2020  Voice Recognition /Dragon dictation system was used to create this note, attempts have been made to correct errors. Please contact the author with questions and/or clarifications.   Florencia Reasons, MD PhD FACP [facility]  Available via Epic secure chat 7am-7pm for nonurgent issues Please page for urgent issues To page the attending provider between 7A-7P or the covering provider during after hours 7P-7A, please log into the web site [email or web address] and access using universal [place] password for that web site. If you do not have the password, please call the hospital operator.    03/02/2020, 11:17 AM

## 2020-03-02 NOTE — Progress Notes (Signed)
Pharmacy Antibiotic Note  Holly Valenzuela is a 79 y.o. female admitted on 02/28/2020 with syncope. Previously on ceftriaxone empirically for possible UTI, then switched to Zosyn following Pseudomonas on urine culture. Pharmacy has now been consulted for ceftazidime dosing for pseudomonas UTI.  Today, patient is afebrile with WBC improving. CrCl ~34. Urine cultures with pseudomonas, now showing pan-sensitive. Plan to continue UTI treatment at this time per Dr. Erlinda Hong but will narrow coverage to ceftazidime based on sensitivities. Given borderline renal function, will plan to use lower end of dosing range at 1g q12h. Continue to follow renal function and clinical improvement for possible dose adjustment or discontinuation.   Plan: Ceftazidime 1g IV q12h Monitor renal function and clinical improvement F/u LOT   Height: 5\' 5"  (165.1 cm) Weight: 38.8 kg (85 lb 8.6 oz) IBW/kg (Calculated) : 57  Temp (24hrs), Avg:98.1 F (36.7 C), Min:97.8 F (36.6 C), Max:98.5 F (36.9 C)  Recent Labs  Lab 02/28/20 2001 02/29/20 0707 03/01/20 0314  WBC 19.7* 12.2*  12.6* 10.5  CREATININE 0.91 0.76  0.71 0.65    Estimated Creatinine Clearance: 34.4 mL/min (by C-G formula based on SCr of 0.65 mg/dL).    No Known Allergies Microbiology results: 9/30UCx - pseudomonas (Pan-sensitive) 9/29 COVID: negative  Antimicrobials this admission:  Ceftriaxone 9/30>>10/1  Zosyn 10/1>>10/2 Ceftazidime 10/2>>  Thank you for allowing pharmacy to be a part of this patient's care.  Claudina Lick, PharmD PGY1 Acute Care Pharmacy Resident 03/02/2020 10:29 AM  Please check [email or web address] for unit-specific pharmacy phone numbers.

## 2020-03-03 LAB — BASIC METABOLIC PANEL
Anion gap: 11 (ref 5–15)
BUN: 14 mg/dL (ref 8–23)
CO2: 26 mmol/L (ref 22–32)
Calcium: 9.6 mg/dL (ref 8.9–10.3)
Chloride: 96 mmol/L — ABNORMAL LOW (ref 98–111)
Creatinine, Ser: 0.81 mg/dL (ref 0.44–1.00)
GFR calc Af Amer: >60 mL/min (ref >60)
GFR calc non Af Amer: >60 mL/min (ref >60)
Glucose, Bld: 101 mg/dL — ABNORMAL HIGH (ref 70–99)
Potassium: 3.5 mmol/L (ref 3.5–5.1)
Sodium: 133 mmol/L — ABNORMAL LOW (ref 135–145)

## 2020-03-03 LAB — CORTISOL-AM, BLOOD: Cortisol - AM: 13.2 ug/dL (ref 6.7–22.6)

## 2020-03-03 LAB — MAGNESIUM: Magnesium: 2.2 mg/dL (ref 1.7–2.4)

## 2020-03-03 MED ORDER — POTASSIUM CHLORIDE CRYS ER 20 MEQ PO TBCR
40.0000 meq | EXTENDED_RELEASE_TABLET | Freq: Once | ORAL | Status: AC
  Administered 2020-03-03: 40 meq via ORAL
  Filled 2020-03-03: qty 2

## 2020-03-03 MED ORDER — ADULT MULTIVITAMIN W/MINERALS CH
1.0000 | ORAL_TABLET | Freq: Every day | ORAL | Status: DC
  Administered 2020-03-03 – 2020-03-04 (×2): 1 via ORAL
  Filled 2020-03-03 (×2): qty 1

## 2020-03-03 MED ORDER — POLYETHYLENE GLYCOL 3350 17 G PO PACK
17.0000 g | PACK | Freq: Every day | ORAL | Status: DC
  Administered 2020-03-04: 17 g via ORAL
  Filled 2020-03-03 (×2): qty 1

## 2020-03-03 NOTE — Progress Notes (Signed)
This RN contacted Pharmacy about activated antibiotics when realized it is not scheduled until 1000. Per Mickel Baas (Pharmacy [facility]. Medication is good until 12hrs after been activated. Will pass it on to incoming RN.

## 2020-03-03 NOTE — Progress Notes (Signed)
PROGRESS NOTE    Holly Valenzuela [facility]  GBT:[id number] DOB: December 31, 1940 DOA: 02/28/2020 PCP: Crist Infante, MD    Chief Complaint  Patient presents with  . Fall    Brief Narrative:  Chief Complaint: Loss of consciousness.  HPI: Holly Valenzuela is a 79 y.o. female with history of hypertension, hyperlipidemia, ongoing tobacco abuse, COPD was brought to the ER after patient had an episode of loss of consciousness.  Patient states she was back home from shopping at a store when she fell outside her house unconscious.  Patient states she was not having any dizziness shortness of breath or chest pain prior to the episode.  Patient states she may have been on the floor for at least 2 hours after which he regained consciousness and called her family and was brought to the ER.  Patient states her blood pressure medicines dose was recently increased.  ED Course: In the ER patient appeared nonfocal CT head and C-spine was unremarkable labs were significant for sodium of 129 WBC was 19.7 EKG was showing normal sinus rhythm with QTC of 486 ms.  Patient was started on gentle hydration and admitted for further observation.  Chest x-ray unremarkable Covid test was negative.   Subjective:  Feeling better, less dizzy, agrees to try to sit up eat today She  denies pain  Assessment & Plan:   Principal Problem:   Syncope Active Problems:   Essential hypertension   Syncope with loss of consciousness during shopping  - unclear etiology -CT of head no acute findings, does show"Chronic ischemic microangiopathy and generalized volume loss without acute intracranial abnormality" -Telemetry has been unremarkable, cardiology consulted, plan for outpatient event monitor -Echo with preserved left ventricular EF -She is dehydrated on presentation with positive orthostatic vital signs -She could also have UTI  Orthostatic hypotension due to dehydration Resolved after hydration  positional  dizziness -Persistent dizziness after rehydrated -When standing up or being tired in bed -on exam I do not see any nystagmus, when I asked her to turn her head in bed she denies to feeling dizzy -Per family patient started having dizzy spells recently, PCP prescribed Antivert,  -MRI of the brain due to persistent dizziness no acute event -due to persistent symptom,  neurology consulted -Neurology do not think dizziness is neurologic or autonomic dysfunction, nonpharmacologic recommendation for orthostatic hypotension provided -Patient started to feel better today  Pseudomelenas UTI -With report of recurrent UTI recently, urine culture obtained after antibiotic given positive for Pseudomonas, sensitive to [place] and ciprofloxacin Zosyn cefepime imipenem -DC Rocephin, changed Zosyn to [employer], day 3 of total 5 day treatment. Continue fortaz if remain in the hospital , can discharge on cipro if discharged tomorrow   Leukocytosis: Could from UTI plus dehydration WBC 19.7 on presentation, WBC normalized today on October 1  Hyponatremia sodium 129 on presentation, likely due to dehydration, sodium improved to 133 today, DC hydration   encourage oral intake  Hypokalemia, continue to replace, repeat lab in the morning  Hypertension/hyperlipidemia: Continue home medication Cozaar and Lipitor  COPD longtime smoker (daughter report patient started smoking since she was 20, she continues smokes) No wheezing, lung exam clear  History of migraine, on as needed Fioricet Denies headache  Chronic constipation, Senokot  Underweight, will consult nutrition Patient report her weight has been stable Body mass index is 14.23 kg/m.  Failure to thrive: Patient reports to me that she walks daily she likes outdoor, but daughter state patient mostly stays at home watching TV PT  eval recommend skilled nursing facility placement  Impaired memory: Patient denies Immediate recall 2 out of 3, slightly  confused about the month.  DVT prophylaxis: enoxaparin (LOVENOX) injection 30 mg Start: 02/29/20 2200   Code Status: Full Family Communication: daughter over the phone Disposition:   Status is: inpatient   Dispo: The patient is from: Home              Anticipated d/c is to: Skilled nursing facility              Anticipated d/c date is: tomorrow              Patient is currently medically stable to discharge, awaiting for snf bed  Consultants:   Cardiology  neurology   Procedures:   None  Antimicrobials:   Rocephin then Zosyn then Fortaz     Objective: Vitals:   03/02/20 2313 03/03/20 0424 03/03/20 0847 03/03/20 1116  BP: 115/85 123/86 (!) 133/91 136/89  Pulse: 84 74 75 72  Resp: 19 18  16   Temp: 98.2 F (36.8 C) 98.2 F (36.8 C)  98.1 F (36.7 C)  TempSrc: Oral Oral  Oral  SpO2: 94% 95%  97%  Weight:      Height:        Intake/Output Summary (Last 24 hours) at 03/03/2020 1718 Last data filed at 03/03/2020 1243 Gross per 24 hour  Intake 570.11 ml  Output 700 ml  Net -129.89 ml   Filed Weights   02/28/20 1958 02/29/20 0134  Weight: 38.6 kg 38.8 kg    Examination:  General exam: calm, NAD Respiratory system: Clear to auscultation. Respiratory effort normal. Cardiovascular system: S1 & S2 heard, RRR. No JVD, no murmur, No pedal edema. Gastrointestinal system: Abdomen is nondistended, soft and nontender. No organomegaly or masses felt. Normal bowel sounds heard. Central nervous system: Alert and oriented. No focal neurological deficits.  Immediate recall 2 out of 3 Extremities: Symmetric 5 x 5 power. Skin: No rashes, lesions or ulcers Psychiatry: Judgement and insight appear normal. Mood & affect appropriate.     Data Reviewed: I have personally reviewed following labs and imaging studies  CBC: Recent Labs  Lab 02/28/20 2001 02/29/20 0707 03/01/20 0314  WBC 19.7* 12.2*  12.6* 10.5  NEUTROABS 17.4*  --  6.5  HGB 15.1* 14.0  14.1 13.5    HCT 44.4 41.0  40.8 40.4  MCV 96.7 95.6  95.3 96.7  PLT 299 269  269 893    Basic Metabolic Panel: Recent Labs  Lab 02/28/20 2001 02/29/20 0707 03/01/20 0314 03/03/20 0304  NA 129* 132* 132* 133*  K 4.0 3.6 3.4* 3.5  CL 88* 96* 98 96*  CO2 27 24 24 26   GLUCOSE 131* 96 98 101*  BUN 10 6* 5* 14  CREATININE 0.91 0.76  0.71 0.65 0.81  CALCIUM 8.8* 8.3* 8.4* 9.6  MG  --   --  2.0 2.2    GFR: Estimated Creatinine Clearance: 33.9 mL/min (by C-G formula based on SCr of 0.81 mg/dL).  Liver Function Tests: Recent Labs  Lab 02/28/20 2001  AST 28  ALT 22  ALKPHOS 116  BILITOT 0.6  PROT 6.6  ALBUMIN 3.9    CBG: No results for input(s): GLUCAP in the last 168 hours.   Recent Results (from the past 240 hour(s))  Respiratory Panel by RT PCR (Flu A&B, Covid) - Nasopharyngeal Swab     Status: None   Collection Time: 02/28/20  8:06 PM   Specimen:  Nasopharyngeal Swab  Result Value Ref Range Status   SARS Coronavirus 2 by RT PCR NEGATIVE NEGATIVE Final    Comment: (NOTE) SARS-CoV-2 target nucleic acids are NOT DETECTED.  The SARS-CoV-2 RNA is generally detectable in upper respiratoy specimens during the acute phase of infection. The lowest concentration of SARS-CoV-2 viral copies this assay can detect is 131 copies/mL. A negative result does not preclude SARS-Cov-2 infection and should not be used as the sole basis for treatment or other patient management decisions. A negative result may occur with  improper specimen collection/handling, submission of specimen other than nasopharyngeal swab, presence of viral mutation(s) within the areas targeted by this assay, and inadequate number of viral copies (<131 copies/mL). A negative result must be combined with clinical observations, patient history, and epidemiological information. The expected result is Negative.  Fact Sheet for Patients:  PinkCheek.be  Fact Sheet for Healthcare Providers:   GravelBags.it  This test is no t yet approved or cleared by the [place] FDA and  has been authorized for detection and/or diagnosis of SARS-CoV-2 by FDA under an Emergency Use Authorization (EUA). This EUA will remain  in effect (meaning this test can be used) for the duration of the COVID-19 declaration under Section 564(b)(1) of the Act, 21 U.S.C. section 360bbb-3(b)(1), unless the authorization is terminated or revoked sooner.     Influenza A by PCR NEGATIVE NEGATIVE Final   Influenza B by PCR NEGATIVE NEGATIVE Final    Comment: (NOTE) The Xpert Xpress SARS-CoV-2/FLU/RSV assay is intended as an aid in  the diagnosis of influenza from Nasopharyngeal swab specimens and  should not be used as a sole basis for treatment. Nasal washings and  aspirates are unacceptable for Xpert Xpress SARS-CoV-2/FLU/RSV  testing.  Fact Sheet for Patients: PinkCheek.be  Fact Sheet for Healthcare Providers: GravelBags.it  This test is not yet approved or cleared by the [place] FDA and  has been authorized for detection and/or diagnosis of SARS-CoV-2 by  FDA under an Emergency Use Authorization (EUA). This EUA will remain  in effect (meaning this test can be used) for the duration of the  Covid-19 declaration under Section 564(b)(1) of the Act, 21  U.S.C. section 360bbb-3(b)(1), unless the authorization is  terminated or revoked. Performed at [facility], [place] [address], [place], [place] [address]   Culture, Urine     Status: Abnormal   Collection Time: 02/29/20  3:33 PM   Specimen: Urine, Random  Result Value Ref Range Status   Specimen Description URINE, RANDOM  Final   Special Requests   Final    NONE Performed at [facility], [place] [address], [place], [place] [address]    Culture 40,000 COLONIES/mL PSEUDOMONAS AERUGINOSA (A)  Final   Report Status 03/02/2020 FINAL  Final    Organism ID, Bacteria PSEUDOMONAS AERUGINOSA (A)  Final      Susceptibility   Pseudomonas aeruginosa - MIC*    CEFTAZIDIME <=1 SENSITIVE Sensitive     CIPROFLOXACIN <=0.25 SENSITIVE Sensitive     GENTAMICIN <=1 SENSITIVE Sensitive     IMIPENEM 2 SENSITIVE Sensitive     PIP/TAZO 8 SENSITIVE Sensitive     CEFEPIME 1 SENSITIVE Sensitive     * 40,000 COLONIES/mL PSEUDOMONAS AERUGINOSA         Radiology Studies: No results found.      Scheduled Meds: . atorvastatin  10 mg Oral Daily  . enoxaparin (LOVENOX) injection  30 mg Subcutaneous QHS  . feeding supplement (ENSURE  ENLIVE)  237 mL Oral TID BM  . losartan  50 mg Oral Daily  . mirtazapine  7.5 mg Oral QHS  . multivitamin with minerals  1 tablet Oral Daily  . senna-docusate  1 tablet Oral BID   Continuous Infusions: . cefTAZidime (FORTAZ)  IV 1 g (03/03/20 0900)     LOS: 3 days   Time spent: 35 mins Greater than 50% of this time was spent in counseling, explanation of diagnosis, planning of further management, and coordination of care.  I have personally reviewed and interpreted on  03/03/2020 daily labs, tele strips, imagings as discussed above under date review session and assessment and plans.  I reviewed all nursing notes, pharmacy notes, consultant note,  vitals, pertinent old records  I have discussed plan of care as described above with RN , patient and family on 03/03/2020  Voice Recognition /Dragon dictation system was used to create this note, attempts have been made to correct errors. Please contact the author with questions and/or clarifications.   Florencia Reasons, MD PhD FACP [facility]  Available via Epic secure chat 7am-7pm for nonurgent issues Please page for urgent issues To page the attending provider between 7A-7P or the covering provider during after hours 7P-7A, please log into the web site [email or web address] and access using universal [place] password for that web site. If you do not have the  password, please call the hospital operator.    03/03/2020, 5:18 PM

## 2020-03-03 NOTE — TOC Progression Note (Signed)
Transition of Care [facility]) - Progression Note    Patient Details  Name: Holly Valenzuela MRN: [id number] Date of Birth: 12-Jan-1941  Transition of [facility] CM/SW Contact  Andria Frames, [place] Work Phone Number: 03/03/2020, 2:02 PM  Clinical Narrative:    MSW Intern met with patient to discuss bed offers.  Patient would like to be in the same facility as her sister, who is at [facility] in [place]. MSW Intern advised patient of other bed offers.  Patient expressed that if she can't go to be near her sister; that she could just go home.  However patient did also state she has no one there to assist her.  Patient provided contact information for her brother in law, Roderic Scarce "Yorkie" York _0 [phone] and advised that she would like for someone to discuss the options with him. MSW Intern attempted contact with Mr. Allean Found; left 2 VM's.   MSW Intern reached out to [facility] and confirmed they don't have admissions staff on the weekend. TOC staff can follow up on Monday.  MSW Intern faxed patient referral information to [facility].   TOC will continue to follow patient for any discharge support needs.      Expected Discharge Plan: [place] Barriers to Discharge: Continued Medical Work up  Expected Discharge Plan and Services Expected Discharge Plan: [place] In-house Referral: Clinical Social Work Discharge Planning Services: CM Consult Post Acute Care Choice: [place] arrangements for the past 2 months: Single Family Home Expected Discharge Date: 03/03/20                                     Social Determinants of Health (SDOH) Interventions    Readmission Risk Interventions Readmission Risk Prevention Plan 03/01/2020  Post Dischage Appt Not Complete  Appt Comments rec for SNF  Medication Screening Complete  Transportation Screening Complete

## 2020-03-03 NOTE — Progress Notes (Signed)
MSW intern spoke with patient's brother in law, Mr. Allean Found. He indicated he would like to speak with administrator at [facility] that he knows to see if they have bed availability; he will reach out to [employer].  MSW Intern spoke with patient's daughter Alfonso Patten. She is in agreement with patient going to [facility] if possible and also in agreement with [facility] collaborating with Mr. Allean Found since she resides in [place]. Patient's daughter provided email address as [email or web address] for any documents to be sent to her for facility placement. She confirmed she is the decision maker because she is patient's daughter however she advised she does not have anything in writing. Ms. Gaye Alken wants to be notified of updates throughout process.

## 2020-03-03 NOTE — Consult Note (Addendum)
NEUROLOGY CONSULTATION NOTE   Date of service: March 03, 2020 Patient Name: Holly Valenzuela MRN:  [id number] DOB:  March 22, 1941 Reason for consult: "Persistent dizziness"  History of Present Illness  Holly Valenzuela is a 79 y.o. female with PMH significant for COPD, HLD, Hypertension, Migraines, and Tobacco abuse who presents with a syncope episode. She returned home from shopping when she passed outside her home. Workup with CTH negative, MRI Brain negative, telemetry negative, Echo with preserved EF. Orthostatics were positive and resolved with hydration. Also being treated with a UTI. Noted to have positive urine culture for pseudomonas and currently on Ceftazidime.  Upon talking to [name], she endorses dizziness everytime she sits up. She initially described the dizziness as room spinning. She denies any tinnitis, no ear pain, no ear discharge, no rhinitis. She denies any dizizness when she turns her head to the left or right or lookin up or down. We attempted to recreate the dizziness by having her sit up and she did feel an episode come on. Upon questioning during the episode, she denies that this is spinning. She feels that things appear a little blurry during the episode. If she attempts to sit up slowly, the episode is not as severe. She feels that this has been going on for the last few days but is getting better. She endorses poor PO food and liquid intake. She is just not hungry and eats TV dinners at home.   ROS   Constitutional Endorses weight loss, denies fever and chills.  HEENT Denies changes in vision and hearing.  Respiratory Denies SOB and cough.   CV Denies palpitations and CP   GI Denies abdominal pain, nausea, vomiting and diarrhea.   GU Denies dysuria and urinary frequency.   MSK Denies myalgia and joint pain.   Skin Denies rash and pruritus.   Neurological Denies headache and syncope.   Psychiatric Denies recent changes in mood. Denies anxiety and  depression.    Past History   Past Medical History:  Diagnosis Date  . COPD (chronic obstructive pulmonary disease) ([place])   . HLD (hyperlipidemia)   . Hypertension   . Migraines   . Tobacco abuse    Past Surgical History:  Procedure Laterality Date  . APPENDECTOMY    . CESAREAN SECTION     Family History  Family history unknown: Yes   Social History   Socioeconomic History  . Marital status: Divorced    Spouse name: Not on file  . Number of children: Not on file  . Years of education: Not on file  . Highest education level: Not on file  Occupational History  . Not on file  Tobacco Use  . Smoking status: Current Every Day Smoker  . Smokeless tobacco: Never Used  Substance and Sexual Activity  . Alcohol use: Never  . Drug use: Not on file  . Sexual activity: Not on file  Other Topics Concern  . Not on file  Social History Narrative  . Not on file   Social Determinants of Health   Financial Resource Strain:   . Difficulty of Paying Living Expenses: Not on file  Food Insecurity:   . Worried About Charity fundraiser in the Last Year: Not on file  . Ran Out of Food in the Last Year: Not on file  Transportation Needs:   . Lack of Transportation (Medical): Not on file  . Lack of Transportation (Non-Medical): Not on file  Physical Activity:   . Days  of Exercise per Week: Not on file  . Minutes of Exercise per Session: Not on file  Stress:   . Feeling of Stress : Not on file  Social Connections:   . Frequency of Communication with Friends and Family: Not on file  . Frequency of Social Gatherings with Friends and Family: Not on file  . Attends Religious Services: Not on file  . Active Member of Clubs or Organizations: Not on file  . Attends Archivist Meetings: Not on file  . Marital Status: Not on file   No Known Allergies  Medications   Medications Prior to Admission  Medication Sig Dispense Refill Last Dose  . ALPRAZolam (XANAX) 0.5 MG tablet  Take 0.25-0.5 mg by mouth 3 (three) times daily as needed for anxiety.    Past Week at Unknown time  . atorvastatin (LIPITOR) 10 MG tablet Take 10 mg by mouth daily.   02/28/2020 at Unknown time  . butalbital-acetaminophen-caffeine (FIORICET) 50-325-40 MG tablet Take 1 tablet by mouth 4 (four) times daily as needed for migraine.    Past Week at Unknown time  . losartan (COZAAR) 50 MG tablet Take 50 mg by mouth daily.   02/28/2020 at Unknown time  . meclizine (ANTIVERT) 12.5 MG tablet Take 12.5 mg by mouth every 6 (six) hours as needed for dizziness.    02/28/2020 at Unknown time  . mirtazapine (REMERON) 15 MG tablet Take 7.5 mg by mouth at bedtime.    02/27/2020 at Unknown time     Vitals   Vitals:   03/02/20 0841 03/02/20 1217 03/02/20 1758 03/02/20 2313  BP: (!) 122/95 133/85 (!) 143/89 115/85  Pulse: 72 85 70 84  Resp:  16 16 19   Temp:  98 F (36.7 C) 98.5 F (36.9 C) 98.2 F (36.8 C)  TempSrc:  Oral Oral Oral  SpO2:  95% 95% 94%  Weight:      Height:         Body mass index is 14.23 kg/m.  Physical Exam   General: Laying comfortably in bed; in no acute distress.  HENT: Normal oropharynx and mucosa. Normal external appearance of ears and nose. Neck: Supple, no pain or tenderness CV: No JVD. No peripheral edema. Pulmonary: Symmetric Chest rise. Normal respiratory effort.  Abdomen: Soft to touch, non-tender.  Ext: No cyanosis, edema, or deformity  Skin: No rash. Normal palpation of skin.   Musculoskeletal: Normal digits and nails by inspection. No clubbing.   Neurologic Examination  Mental status/Cognition: Alert, oriented to self, place, month and year, good attention.  Speech/language: Fluent, comprehension intact, object naming intact, repetition intact.  Cranial nerves:   CN II Pupils equal and reactive to light, no VF deficits    CN III,IV,VI EOM intact, no gaze preference or deviation, no nystagmus    CN V normal sensation in V1, V2, and V3 segments bilaterally    CN  VII no asymmetry, no nasolabial fold flattening    CN VIII normal hearing to speech    CN IX & X normal palatal elevation, no uvular deviation    CN XI 5/5 head turn and 5/5 shoulder shrug bilaterally    CN XII midline tongue protrusion    Motor:  Muscle bulk: poor, tone normal, pronator drift none tremor none Mvmt Root Nerve  Muscle Right Left Comments  SA C5/6 Ax Deltoid 4+ 4+   EF C5/6 Mc Biceps 4+ 4+   EE C6/7/8 Rad Triceps 4+ 4+   WF C6/7 Med FCR  4+ 4+   WE C7/8 PIN ECU 4+ 4+   F Ab C8/T1 U ADM/FDI 4+ 4+   HF L1/2/3 Fem Illopsoas 4+ 4+   KE L2/3/4 Fem Quad 4+ 4+   DF L4/5 D Peron Tib Ant 4+ 4+   PF S1/2 Tibial Grc/Sol 4+ 4+    Reflexes:  Right Left Comments  Pectoralis      Biceps (C5/6) 1 1   Brachioradialis (C5/6) 1 1    Triceps (C6/7) 1 1    Patellar (L3/4) 1 1    Achilles (S1) 1 1    Hoffman      Plantar     Jaw jerk    Sensation:  Light touch Intact throughout   Pin prick    Temperature    Vibration   Proprioception    Coordination/Complex Motor:  - Finger to Nose intact BL - Heel to shin intact - Rapid alternating movement are normal  Labs   CBC:  Recent Labs  Lab 02/28/20 2001 02/28/20 2001 02/29/20 0707 03/01/20 0314  WBC 19.7*   < > 12.2*  12.6* 10.5  NEUTROABS 17.4*  --   --  6.5  HGB 15.1*   < > 14.0  14.1 13.5  HCT 44.4   < > 41.0  40.8 40.4  MCV 96.7   < > 95.6  95.3 96.7  PLT 299   < > 269  269 258   < > = values in this interval not displayed.    Basic Metabolic Panel:  Lab Results  Component Value Date   NA 132 (L) 03/01/2020   K 3.4 (L) 03/01/2020   CO2 24 03/01/2020   GLUCOSE 98 03/01/2020   BUN 5 (L) 03/01/2020   CREATININE 0.65 03/01/2020   CALCIUM 8.4 (L) 03/01/2020   GFRNONAA >60 03/01/2020   GFRAA >60 03/01/2020   Lipid Panel: No results found for: LDLCALC HgbA1c: No results found for: HGBA1C Urine Drug Screen: No results found for: LABOPIA, COCAINSCRNUR, LABBENZ, AMPHETMU, THCU, LABBARB  Alcohol Level No  results found for: [place] and Diagnostic studies  MR BRAIN WO CONTRAST 1. No acute intracranial abnormality. 2. 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 Electronically Signed   By: Genevie Ann M.D.   On: 03/01/2020 14:41     Impression   Karizma Cheek is a 79 y.o. female PMH significant for COPD, HLD, Hypertension, Migraines, and Tobacco abuse who presents with a syncope episode and episodes of dizziness with sitting up or standing up. On tryin to clarify this dizziness, this is not spinning, she describes it as blurry vision when sitting up. She is less likely to get it if she sits up slowly. Her neurologic examination is normal. Cardiac workup is negative. The episode seems more concerning for orthostatic hypotension rather than vertigo. althou autonomic dysfunction can cause orthostatic hypotension, it does not present this way and so rapidly. Typical onset for autonomic dysfunction is gradual over the course of months. I would recommend careful titration of her medications and in addition to medication, recommend serum TSH and Cortisol to evaluate for hormonal causes of orthostatis.  Recommendations  - I ordered serum Cortisol levels. Mineralocorticoid deficiency can cause orthostatis. - I do not think this is neurological or autonomic dysfunction. - I ordered multivitamin and multiminerals tablet PO daily. Micronutrient deficiency is linked to orthostatic hypotension. - Would recommend being careful with medications on American Geriatric[employer] Society beers criteria. -  Can do abdominal binders and compression stockings to help with orthostasis if this is persistent and no cause identified. ______________________________________________________________________  - Non-Pharmacologic Recs for Orthostatic Hypotension 1) Lifestyle modification. These measures include:  - Arising slowly, in stages, from supine to seated to standing. This  maneuver is most important in the morning, when orthostatic tolerance is lowest.  - Avoiding straining, coughing, and walking in hot weather.These activities reduce venous return and worsen orthostatic hypotension.  - Maintaining hydration and avoiding over-heating.  - Raising the head of the bed 30 to 45 degrees decreases renal perfusion, thereby activating the renin-angiotensin-aldosterone system and decreasing nocturnal diuresis, which can be pronounced in these patients.  These changes relieve orthostatic hypotension by expanding extracellular fluid volume and may reduce end organ damage by reducing supine HTN.  2) Exercise - walking 30 minutes a day, exercise in a swimming pool, exercise in a recumbent or seated position (using a stationary bike or rowing machine)  3) Abdominal binders   4) Compression Stockings  5) Increased salt and water intake to 2 L to 2.5 L of water a day  6) Modification of meals:  - Avoiding large meals  - Ingesting meals low in carbohydrates  - Alcohol should be avoided during the day as it is a vasodilator  - Drink water with meals  - Avoiding activities or sudden standing immediately after eating  7) Physical Countermaneuvers during daily activities: leg crossing, standing on tip toes, squatting  Thank you for the opportunity to take part in the care of this patient. If you have any further questions, please contact the neurology consultation attending.  Signed,  [place] Pager Number [phone]

## 2020-03-04 LAB — BASIC METABOLIC PANEL
Anion gap: 8 (ref 5–15)
BUN: 16 mg/dL (ref 8–23)
CO2: 29 mmol/L (ref 22–32)
Calcium: 10.2 mg/dL (ref 8.9–10.3)
Chloride: 98 mmol/L (ref 98–111)
Creatinine, Ser: 0.92 mg/dL (ref 0.44–1.00)
GFR calc Af Amer: >60 mL/min (ref >60)
GFR calc non Af Amer: 59 mL/min — ABNORMAL LOW (ref >60)
Glucose, Bld: 102 mg/dL — ABNORMAL HIGH (ref 70–99)
Potassium: 5.4 mmol/L — ABNORMAL HIGH (ref 3.5–5.1)
Sodium: 135 mmol/L (ref 135–145)

## 2020-03-04 LAB — RESPIRATORY PANEL BY RT PCR (FLU A&B, COVID)
Influenza A by PCR: NEGATIVE
Influenza B by PCR: NEGATIVE
SARS Coronavirus 2 by RT PCR: NEGATIVE

## 2020-03-04 MED ORDER — ALPRAZOLAM 0.5 MG PO TABS: 0.2500 mg | ORAL_TABLET | Freq: Three times a day (TID) | ORAL | 0 refills | Status: AC | PRN

## 2020-03-04 MED ORDER — SODIUM CHLORIDE 0.9 % IV SOLN
INTRAVENOUS | Status: DC | PRN
  Administered 2020-03-04: 1000 mL via INTRAVENOUS

## 2020-03-04 MED ORDER — ADULT MULTIVITAMIN W/MINERALS CH: 1.0000 | ORAL_TABLET | Freq: Every day | ORAL | Status: DC

## 2020-03-04 MED ORDER — MECLIZINE HCL 12.5 MG PO TABS: 12.5000 mg | ORAL_TABLET | Freq: Three times a day (TID) | ORAL | 0 refills | Status: DC | PRN

## 2020-03-04 MED ORDER — ALPRAZOLAM 0.5 MG PO TABS
0.2500 mg | ORAL_TABLET | Freq: Three times a day (TID) | ORAL | 0 refills | Status: DC | PRN
Start: 2020-03-04 — End: 2020-03-04

## 2020-03-04 NOTE — Progress Notes (Signed)
[facility] here to transport pt to [facility]. All pts belongings taken with pt. IV and tele removed on previous shift.

## 2020-03-04 NOTE — TOC Progression Note (Addendum)
Transition of Care [facility]) - Progression Note    Patient Details  Name: Holly Valenzuela MRN: [id number] Date of Birth: [birth year]  Transition of Care [facility]) CM/SW [place], [place] Phone Number: 03/04/2020, 10:36 AM  Clinical Narrative:    11:34- CSW received a call from pt daughter [name], she is inquiring about if someone has the ability to go over the paperwork with pt, explained that I defer questions regarding facility specific paperwork to SNF, admissions liaison Narda Rutherford will f/u with daughter regarding paperwork process.   11:13am- CSW spoke with pt brother in law Yorkie, he will complete paperwork for pt at [employer] at 3pm.   10:36am- CSW spoke with pt brother in law [place], as well as admissions liaison Elyse Hsu, unfortunately no beds at [facility] at this time. Pt brother in law requesting placement at [facility]. [facility] does have a bed for placement if pt stable. New COVID swab requested. CSW spoke with pt and pt daughter and they are also both in placement and trust Yorkie's choice. Paperwork to be emailed to pt daughter for completion. If unable to complete this way then they request Yorkie complete paperwork. Will f/u to ensure [name] recieves the paperwork.   Expected Discharge Plan: [place] Barriers to Discharge: Continued Medical Work up  Expected Discharge Plan and Services Expected Discharge Plan: [place] In-house Referral: Clinical Social Work Discharge Planning Services: CM Consult Post Acute Care Choice: [place] arrangements for the past 2 months: Single Family Home Expected Discharge Date: 03/03/20                Readmission Risk Interventions Readmission Risk Prevention Plan 03/01/2020  Post Dischage Appt Not Complete  Appt Comments rec for SNF  Medication Screening Complete  Transportation Screening Complete

## 2020-03-04 NOTE — Care Management Important Message (Signed)
Important Message  Patient Details  Name: Holly Valenzuela MRN: [id number] Date of Birth: March 22, 1941   Medicare Important Message Given:  Yes - Important Message mailed due to current National Emergency  Verbal consent obtained due to current National Emergency  Relationship to patient: Self Contact Name: Nateisha Moyd Call Date: 03/04/20  Time: 1119 Phone: [phone] Outcome: No Answer/Busy Important Message mailed to: Patient address on file    Delorse Lek 03/04/2020, 11:19 AM

## 2020-03-04 NOTE — Social Work (Signed)
Clinical Social Worker facilitated patient discharge including contacting patient family and facility to confirm patient discharge plans.  Clinical information faxed to facility and family agreeable with plan.  CSW arranged ambulance transport via [facility] to [place] [facility].  RN to call [phone]  with report prior to discharge.  Clinical Social Worker will sign off for now as social work intervention is no longer needed. Please consult [place] again if new need arises.  Westley Hummer, MSW, LCSW Clinical Social Worker

## 2020-03-04 NOTE — Discharge Summary (Addendum)
Physician Discharge Summary  Holly Valenzuela PNT:[id number] DOB: 1940-12-27 DOA: 02/28/2020  PCP: Crist Infante, MD  Admit date: 02/28/2020 Discharge date: 03/04/2020  Admitted From: home Discharge disposition: SNF   Recommendations for Outpatient Follow-Up:   1. Multivitamins 2. BMP 1 week 3. Can do abdominal binders and compression stockings to help with orthostasis if this is persistent and no cause identified. 4. Outpatient event monitor with cardiology   Discharge Diagnosis:   Principal Problem:   Syncope Active Problems:   Essential hypertension    Discharge Condition: Improved.  Diet recommendation: Low sodium, heart healthy.  Carbohydrate-modified.  Regular.  Wound care: None.  Code status: Full.   History of Present Illness:   Holly Valenzuela is a 79 y.o. female with history of hypertension, hyperlipidemia, ongoing tobacco abuse, COPD was brought to the ER after patient had an episode of loss of consciousness.  Patient states she was back home from shopping at a store when she fell outside her house unconscious.  Patient states she was not having any dizziness shortness of breath or chest pain prior to the episode.  Patient states she may have been on the floor for at least 2 hours after which he regained consciousness and called her family and was brought to the ER.  Patient states her blood pressure medicines dose was recently increased.   Hospital Course by Problem:   Syncope with loss of consciousness during shopping  - unclear etiology -CT of head no acute findings, does show"Chronic ischemic microangiopathy and generalized volume loss without acute intracranial abnormality" -Telemetry has been unremarkable, cardiology consulted, plan for outpatient event monitor -Echo with preserved left ventricular EF -She is dehydrated on presentation with positive orthostatic vital signs -She could also have UTI  Orthostatic hypotension due  to dehydration Resolved after hydration  positional dizziness -Persistent dizziness after rehydrated -When standing up or being tired in bed -Per family patient started having dizzy spells recently, PCP prescribed Antivert,  -MRI of the brain due to persistent dizziness no acute event -Patient started to feel better today -neurology consult:  -Non-Pharmacologic RecsforOrthostatic Hypotension 1) Lifestyle modification. These measures include:             - Arising slowly, in stages, from supine to seated to standing. This maneuver is most important in the morning, when orthostatic tolerance is lowest.             - Avoiding straining, coughing, and walking in hot weather.These activities reduce venous return and worsen orthostatic hypotension.             - Maintaining hydration and avoiding over-heating.             - Raising the head of the bed 30 to 45 degrees decreases renal perfusion, thereby activating the renin-angiotensin-aldosterone system and decreasing nocturnal diuresis, which can be pronounced in these patients. These changes relieve orthostatic hypotension by expandingextracellular fluid volume and may reduce end organ damage by reducing supine HTN.             2) Exercise - walking 30 minutes a day, exercise in a swimming pool, exercise in a recumbent or seated position (using a stationary bike or rowing machine)             3) Abdominal binders             4)Compression Stockings             5) Increased salt and water intake  to 2 L to 2.5 L of water a day             6) Modification of meals:             - Avoiding large meals             - Ingesting meals low in carbohydrates             - Alcohol should be avoided during the day as it is a vasodilator             - Drink water with meals             - Avoiding activities or sudden standing immediately after eating             7) Physical Countermaneuvers during daily activities: leg crossing, standing on tip toes,  squatting -serum Cortisol levels- normal  Pseudomonas UTI -With report of recurrent UTI recently, urine culture obtained after antibiotic given positive for Pseudomonas, sensitive to [place] and ciprofloxacin Zosyn cefepime imipenem -DC Rocephin, changed Zosyn to [place] and completed course  Leukocytosis:  WBC 19.7 on presentation, WBC normalized today on October 1  Hyponatremia  -replaced  Hypokalemia -over repleted  Hypertension/hyperlipidemia:  -Continue home medication Cozaar and Lipitor  COPD longtime smoker (daughter report patient started smoking since she was 48, she continues smokes) -encourage cessation  History of migraine, on as needed Fioricet Denies headache  Chronic constipation, Senokot  Underweight Patient report her weight has been stable Body mass index is 14.23 kg/m.  Failure to thrive: Patient reports to me that she walks daily she likes outdoor, but daughter state patient mostly stays at home watching TV PT eval recommend skilled nursing facility placement  Impaired memory: Patient denies Immediate recall 2 out of 3, slightly confused about the month.    Medical Consultants:   neurology   Discharge Exam:   Vitals:   03/04/20 0935 03/04/20 1221  BP: 126/84 (!) 144/92  Pulse: 77 84  Resp: 18 16  Temp:  98 F (36.7 C)  SpO2: 98% 97%   Vitals:   03/04/20 0008 03/04/20 0443 03/04/20 0935 03/04/20 1221  BP: 125/83 132/84 126/84 (!) 144/92  Pulse: 70 75 77 84  Resp: 18 18 18 16   Temp: 98.1 F (36.7 C) 98.3 F (36.8 C)  98 F (36.7 C)  TempSrc: Oral Oral  Oral  SpO2: 95% 94% 98% 97%  Weight:      Height:        General exam: Appears calm and comfortable.   The results of significant diagnostics from this hospitalization (including imaging, microbiology, ancillary and laboratory) are listed below for reference.     Procedures and Diagnostic Studies:   CT Head Wo Contrast  Result Date: 02/28/2020 CLINICAL DATA:   Fall EXAM: CT HEAD WITHOUT CONTRAST CT CERVICAL SPINE WITHOUT CONTRAST TECHNIQUE: Multidetector CT imaging of the head and cervical spine was performed following the standard protocol without intravenous contrast. Multiplanar CT image reconstructions of the cervical spine were also generated. COMPARISON:  None. FINDINGS: CT HEAD FINDINGS Brain: There is no mass, hemorrhage or extra-axial collection. There is generalized atrophy without lobar predilection. There is hypoattenuation of the periventricular white matter, most commonly indicating chronic ischemic microangiopathy. Vascular: No abnormal hyperdensity of the major intracranial arteries or dural venous sinuses. No intracranial atherosclerosis. Skull: The visualized skull base, calvarium and extracranial soft tissues are normal. Sinuses/Orbits: No fluid levels or advanced mucosal thickening of the visualized paranasal sinuses.  No mastoid or middle ear effusion. The orbits are normal. CT CERVICAL SPINE FINDINGS Alignment: Reversal of normal cervical lordosis centered at C5. Grade 1 anterolisthesis at C4-5. Grade 1 retrolisthesis at C5-6. Skull base and vertebrae: No acute fracture. Soft tissues and spinal canal: No prevertebral fluid or swelling. No visible canal hematoma. Disc levels: No advanced spinal canal or neural foraminal stenosis. Upper chest: No pneumothorax, pulmonary nodule or pleural effusion. Other: Normal visualized paraspinal cervical soft tissues. IMPRESSION: 1. Chronic ischemic microangiopathy and generalized volume loss without acute intracranial abnormality. 2. No acute fracture or static subluxation of the cervical spine. Electronically Signed   By: Ulyses Jarred M.D.   On: 02/28/2020 20:51   CT Cervical Spine Wo Contrast  Result Date: 02/28/2020 CLINICAL DATA:  Fall EXAM: CT HEAD WITHOUT CONTRAST CT CERVICAL SPINE WITHOUT CONTRAST TECHNIQUE: Multidetector CT imaging of the head and cervical spine was performed following the standard  protocol without intravenous contrast. Multiplanar CT image reconstructions of the cervical spine were also generated. COMPARISON:  None. FINDINGS: CT HEAD FINDINGS Brain: There is no mass, hemorrhage or extra-axial collection. There is generalized atrophy without lobar predilection. There is hypoattenuation of the periventricular white matter, most commonly indicating chronic ischemic microangiopathy. Vascular: No abnormal hyperdensity of the major intracranial arteries or dural venous sinuses. No intracranial atherosclerosis. Skull: The visualized skull base, calvarium and extracranial soft tissues are normal. Sinuses/Orbits: No fluid levels or advanced mucosal thickening of the visualized paranasal sinuses. No mastoid or middle ear effusion. The orbits are normal. CT CERVICAL SPINE FINDINGS Alignment: Reversal of normal cervical lordosis centered at C5. Grade 1 anterolisthesis at C4-5. Grade 1 retrolisthesis at C5-6. Skull base and vertebrae: No acute fracture. Soft tissues and spinal canal: No prevertebral fluid or swelling. No visible canal hematoma. Disc levels: No advanced spinal canal or neural foraminal stenosis. Upper chest: No pneumothorax, pulmonary nodule or pleural effusion. Other: Normal visualized paraspinal cervical soft tissues. IMPRESSION: 1. Chronic ischemic microangiopathy and generalized volume loss without acute intracranial abnormality. 2. No acute fracture or static subluxation of the cervical spine. Electronically Signed   By: Ulyses Jarred M.D.   On: 02/28/2020 20:51   DG Chest Port 1 View  Result Date: 02/28/2020 CLINICAL DATA:  Fall.  Syncope. EXAM: PORTABLE CHEST 1 VIEW COMPARISON:  04/17/2014 chest radiograph, 03/15/2018 chest CT FINDINGS: The lungs are hyperinflated with diffuse interstitial prominence. No focal airspace consolidation or pulmonary edema. No pleural effusion or pneumothorax. Normal cardiomediastinal contours. IMPRESSION: COPD without acute airspace disease.  Electronically Signed   By: Ulyses Jarred M.D.   On: 02/28/2020 20:35   ECHOCARDIOGRAM COMPLETE  Result Date: 02/29/2020    ECHOCARDIOGRAM REPORT   Patient Name:   Holly Valenzuela [facility] Date of Exam: 02/29/2020 Medical Rec #:  [id number]               Height:       65.0 in Accession #:    [id number]              Weight:       85.5 lb Date of Birth:  1941/04/03               BSA:          1.379 m Patient Age:    20 years                BP:           136/87 mmHg Patient Gender: F  HR:           72 bpm. Exam Location:  Inpatient Procedure: 2D Echo Indications:    syncope 780.2  History:        Patient has no prior history of Echocardiogram examinations.                 COPD, Signs/Symptoms:Syncope; Risk Factors:Current Smoker and                 Hypertension.  Sonographer:    Johny Chess Referring Phys: Circle  1. Left ventricular ejection fraction, by estimation, is 60 to 65%. The left ventricle has normal function. The left ventricle has no regional wall motion abnormalities. Left ventricular diastolic parameters are consistent with Grade I diastolic dysfunction (impaired relaxation).  2. Right ventricular systolic function is normal. The right ventricular size is normal.  3. The mitral valve is normal in structure. Trivial mitral valve regurgitation.  4. The aortic valve is tricuspid. There is mild calcification of the aortic valve. There is mild thickening of the aortic valve. Aortic valve regurgitation is not visualized.  5. The inferior vena cava is normal in size with <50% respiratory variability, suggesting right atrial pressure of 8 mmHg. Comparison(s): No prior Echocardiogram. FINDINGS  Left Ventricle: Left ventricular ejection fraction, by estimation, is 60 to 65%. The left ventricle has normal function. The left ventricle has no regional wall motion abnormalities. The left ventricular internal cavity size was normal in size. There is  no left  ventricular hypertrophy. Left ventricular diastolic parameters are consistent with Grade I diastolic dysfunction (impaired relaxation). Right Ventricle: The right ventricular size is normal. Right vetricular wall thickness was not assessed. Right ventricular systolic function is normal. Left Atrium: Left atrial size was normal in size. Right Atrium: Right atrial size was normal in size. Pericardium: There is no evidence of pericardial effusion. Mitral Valve: The mitral valve is normal in structure. There is mild thickening of the mitral valve leaflet(s). There is mild calcification of the mitral valve leaflet(s). Mild mitral annular calcification. Trivial mitral valve regurgitation. Tricuspid Valve: The tricuspid valve is normal in structure. Tricuspid valve regurgitation is trivial. Aortic Valve: The aortic valve is tricuspid. There is mild calcification of the aortic valve. There is mild thickening of the aortic valve. Aortic valve regurgitation is not visualized. Pulmonic Valve: The pulmonic valve was normal in structure. Pulmonic valve regurgitation is mild. Aorta: The aortic root and ascending aorta are structurally normal, with no evidence of dilitation. Venous: The inferior vena cava is normal in size with less than 50% respiratory variability, suggesting right atrial pressure of 8 mmHg. IAS/Shunts: No atrial level shunt detected by color flow Doppler.  LEFT VENTRICLE PLAX 2D LVIDd:         3.60 cm  Diastology LVIDs:         2.70 cm  LV e' medial:    6.53 cm/s LV PW:         0.80 cm  LV E/e' medial:  10.0 LV IVS:        0.80 cm  LV e' lateral:   7.94 cm/s LVOT diam:     1.80 cm  LV E/e' lateral: 8.3 LV SV:         47 LV SV Index:   34 LVOT Area:     2.54 cm  RIGHT VENTRICLE             IVC RV S prime:     13.60  cm/s  IVC diam: 1.70 cm TAPSE (M-mode): 2.2 cm LEFT ATRIUM             Index       RIGHT ATRIUM          Index LA diam:        2.90 cm 2.10 cm/m  RA Area:     7.60 cm LA Vol (A2C):   22.6 ml 16.39  ml/m RA Volume:   12.00 ml 8.70 ml/m LA Vol (A4C):   24.4 ml 17.70 ml/m LA Biplane Vol: 25.0 ml 18.13 ml/m  AORTIC VALVE LVOT Vmax:   86.20 cm/s LVOT Vmean:  56.000 cm/s LVOT VTI:    0.184 m  AORTA Ao Root diam: 2.80 cm Ao Asc diam:  2.60 cm MITRAL VALVE MV Area (PHT): 2.56 cm    SHUNTS MV Decel Time: 296 msec    Systemic VTI:  0.18 m MV E velocity: 65.60 cm/s  Systemic Diam: 1.80 cm MV A velocity: 92.50 cm/s MV E/A ratio:  0.71 Gwyndolyn Kaufman MD Electronically signed by Gwyndolyn Kaufman MD Signature Date/Time: 02/29/2020/3:15:05 PM    Final      Labs:   Basic Metabolic Panel: Recent Labs  Lab 02/28/20 2001 02/28/20 2001 02/29/20 7035 02/29/20 0093 03/01/20 0314 03/01/20 0314 03/03/20 0304 03/04/20 0746  NA 129*  --  132*  --  132*  --  133* 135  K 4.0   < > 3.6   < > 3.4*   < > 3.5 5.4*  CL 88*  --  96*  --  98  --  96* 98  CO2 27  --  24  --  24  --  26 29  GLUCOSE 131*  --  96  --  98  --  101* 102*  BUN 10  --  6*  --  5*  --  14 16  CREATININE 0.91  --  0.76  0.71  --  0.65  --  0.81 0.92  CALCIUM 8.8*  --  8.3*  --  8.4*  --  9.6 10.2  MG  --   --   --   --  2.0  --  2.2  --    < > = values in this interval not displayed.   GFR Estimated Creatinine Clearance: 29.9 mL/min (by C-G formula based on SCr of 0.92 mg/dL). Liver Function Tests: Recent Labs  Lab 02/28/20 2001  AST 28  ALT 22  ALKPHOS 116  BILITOT 0.6  PROT 6.6  ALBUMIN 3.9   No results for input(s): LIPASE, AMYLASE in the last 168 hours. No results for input(s): AMMONIA in the last 168 hours. Coagulation profile Recent Labs  Lab 02/28/20 2001  INR 0.9    CBC: Recent Labs  Lab 02/28/20 2001 02/29/20 0707 03/01/20 0314  WBC 19.7* 12.2*  12.6* 10.5  NEUTROABS 17.4*  --  6.5  HGB 15.1* 14.0  14.1 13.5  HCT 44.4 41.0  40.8 40.4  MCV 96.7 95.6  95.3 96.7  PLT 299 269  269 258   Cardiac Enzymes: Recent Labs  Lab 02/28/20 2001  CKTOTAL 94   BNP: Invalid input(s):  POCBNP CBG: No results for input(s): GLUCAP in the last 168 hours. D-Dimer No results for input(s): DDIMER in the last 72 hours. Hgb A1c No results for input(s): HGBA1C in the last 72 hours. Lipid Profile No results for input(s): CHOL, HDL, LDLCALC, TRIG, CHOLHDL, LDLDIRECT in the last 72 hours. Thyroid function studies No  results for input(s): TSH, T4TOTAL, T3FREE, THYROIDAB in the last 72 hours.  Invalid input(s): FREET3 Anemia work up No results for input(s): VITAMINB12, FOLATE, FERRITIN, TIBC, IRON, RETICCTPCT in the last 72 hours. Microbiology Recent Results (from the past 240 hour(s))  Respiratory Panel by RT PCR (Flu A&B, Covid) - Nasopharyngeal Swab     Status: None   Collection Time: 02/28/20  8:06 PM   Specimen: Nasopharyngeal Swab  Result Value Ref Range Status   SARS Coronavirus 2 by RT PCR NEGATIVE NEGATIVE Final    Comment: (NOTE) SARS-CoV-2 target nucleic acids are NOT DETECTED.  The SARS-CoV-2 RNA is generally detectable in upper respiratoy specimens during the acute phase of infection. The lowest concentration of SARS-CoV-2 viral copies this assay can detect is 131 copies/mL. A negative result does not preclude SARS-Cov-2 infection and should not be used as the sole basis for treatment or other patient management decisions. A negative result may occur with  improper specimen collection/handling, submission of specimen other than nasopharyngeal swab, presence of viral mutation(s) within the areas targeted by this assay, and inadequate number of viral copies (<131 copies/mL). A negative result must be combined with clinical observations, patient history, and epidemiological information. The expected result is Negative.  Fact Sheet for Patients:  PinkCheek.be  Fact Sheet for Healthcare Providers:  GravelBags.it  This test is no t yet approved or cleared by the [place] FDA and  has been authorized  for detection and/or diagnosis of SARS-CoV-2 by FDA under an Emergency Use Authorization (EUA). This EUA will remain  in effect (meaning this test can be used) for the duration of the COVID-19 declaration under Section 564(b)(1) of the Act, 21 U.S.C. section 360bbb-3(b)(1), unless the authorization is terminated or revoked sooner.     Influenza A by PCR NEGATIVE NEGATIVE Final   Influenza B by PCR NEGATIVE NEGATIVE Final    Comment: (NOTE) The Xpert Xpress SARS-CoV-2/FLU/RSV assay is intended as an aid in  the diagnosis of influenza from Nasopharyngeal swab specimens and  should not be used as a sole basis for treatment. Nasal washings and  aspirates are unacceptable for Xpert Xpress SARS-CoV-2/FLU/RSV  testing.  Fact Sheet for Patients: PinkCheek.be  Fact Sheet for Healthcare Providers: GravelBags.it  This test is not yet approved or cleared by the [place] FDA and  has been authorized for detection and/or diagnosis of SARS-CoV-2 by  FDA under an Emergency Use Authorization (EUA). This EUA will remain  in effect (meaning this test can be used) for the duration of the  Covid-19 declaration under Section 564(b)(1) of the Act, 21  U.S.C. section 360bbb-3(b)(1), unless the authorization is  terminated or revoked. Performed at [facility], [place] [address] [place],  [address]   Culture, Urine     Status: Abnormal   Collection Time: 02/29/20  3:33 PM   Specimen: Urine, Random  Result Value Ref Range Status   Specimen Description URINE, RANDOM  Final   Special Requests   Final    NONE Performed at [facility], [place] [address] [place], [place] [address]    Culture 40,000 COLONIES/mL PSEUDOMONAS AERUGINOSA (A)  Final   Report Status 03/02/2020 FINAL  Final   Organism ID, Bacteria PSEUDOMONAS AERUGINOSA (A)  Final      Susceptibility   Pseudomonas aeruginosa - MIC*    CEFTAZIDIME <=1 SENSITIVE  Sensitive     CIPROFLOXACIN <=0.25 SENSITIVE Sensitive     GENTAMICIN <=1 SENSITIVE Sensitive  IMIPENEM 2 SENSITIVE Sensitive     PIP/TAZO 8 SENSITIVE Sensitive     CEFEPIME 1 SENSITIVE Sensitive     * 40,000 COLONIES/mL PSEUDOMONAS AERUGINOSA  Respiratory Panel by RT PCR (Flu A&B, Covid) - Nasopharyngeal Swab     Status: None   Collection Time: 03/04/20 11:30 AM   Specimen: Nasopharyngeal Swab  Result Value Ref Range Status   SARS Coronavirus 2 by RT PCR NEGATIVE NEGATIVE Final    Comment: (NOTE) SARS-CoV-2 target nucleic acids are NOT DETECTED.  The SARS-CoV-2 RNA is generally detectable in upper respiratoy specimens during the acute phase of infection. The lowest concentration of SARS-CoV-2 viral copies this assay can detect is 131 copies/mL. A negative result does not preclude SARS-Cov-2 infection and should not be used as the sole basis for treatment or other patient management decisions. A negative result may occur with  improper specimen collection/handling, submission of specimen other than nasopharyngeal swab, presence of viral mutation(s) within the areas targeted by this assay, and inadequate number of viral copies (<131 copies/mL). A negative result must be combined with clinical observations, patient history, and epidemiological information. The expected result is Negative.  Fact Sheet for Patients:  PinkCheek.be  Fact Sheet for Healthcare Providers:  GravelBags.it  This test is no t yet approved or cleared by the [place] FDA and  has been authorized for detection and/or diagnosis of SARS-CoV-2 by FDA under an Emergency Use Authorization (EUA). This EUA will remain  in effect (meaning this test can be used) for the duration of the COVID-19 declaration under Section 564(b)(1) of the Act, 21 U.S.C. section 360bbb-3(b)(1), unless the authorization is terminated or revoked sooner.     Influenza A by  PCR NEGATIVE NEGATIVE Final   Influenza B by PCR NEGATIVE NEGATIVE Final    Comment: (NOTE) The Xpert Xpress SARS-CoV-2/FLU/RSV assay is intended as an aid in  the diagnosis of influenza from Nasopharyngeal swab specimens and  should not be used as a sole basis for treatment. Nasal washings and  aspirates are unacceptable for Xpert Xpress SARS-CoV-2/FLU/RSV  testing.  Fact Sheet for Patients: PinkCheek.be  Fact Sheet for Healthcare Providers: GravelBags.it  This test is not yet approved or cleared by the [place] FDA and  has been authorized for detection and/or diagnosis of SARS-CoV-2 by  FDA under an Emergency Use Authorization (EUA). This EUA will remain  in effect (meaning this test can be used) for the duration of the  Covid-19 declaration under Section 564(b)(1) of the Act, 21  U.S.C. section 360bbb-3(b)(1), unless the authorization is  terminated or revoked. Performed at [facility], [place] [address], [place], [place] [address]      Discharge Instructions:   Discharge Instructions    Diet general   Complete by: As directed    Increase activity slowly   Complete by: As directed    Increase activity slowly   Complete by: As directed      Allergies as of 03/04/2020   No Known Allergies     Medication List    TAKE these medications   ALPRAZolam 0.5 MG tablet Commonly known as: XANAX Take 0.5-1 tablets (0.25-0.5 mg total) by mouth 3 (three) times daily as needed for anxiety.   atorvastatin 10 MG tablet Commonly known as: LIPITOR Take 10 mg by mouth daily.   butalbital-acetaminophen-caffeine 50-325-40 MG tablet Commonly known as: FIORICET Take 1 tablet by mouth 4 (four) times daily as needed for migraine.   losartan 50 MG tablet Commonly  known as: COZAAR Take 50 mg by mouth daily.   meclizine 12.5 MG tablet Commonly known as: ANTIVERT Take 1 tablet (12.5 mg total) by mouth 3 (three)  times daily as needed for dizziness. What changed: when to take this   mirtazapine 15 MG tablet Commonly known as: REMERON Take 7.5 mg by mouth at bedtime.   multivitamin with minerals Tabs tablet Take 1 tablet by mouth daily. Start taking on: March 05, 2020       Contact information for follow-up providers    Donato Heinz, MD Follow up.   Specialties: Cardiology, Radiology Why: Office will call with date and time of appointment and monitor set up  Contact information: [address] [place] [address] [phone]            Contact information for after-discharge care    Destination    [facility] Preferred SNF .   Service: Skilled Nursing Contact information: [place] [phone]                   Time coordinating discharge: 35 min  Signed:  Geradine Girt DO  [facility] 03/04/2020, 1:25 PM

## 2020-03-04 NOTE — TOC Transition Note (Signed)
Transition of Care [facility]) - CM/SW Discharge Note   Patient Details  Name: Holly Valenzuela MRN: [id number] Date of Birth: [birth year]  Transition of Care [facility]) CM/SW Contact:  Alexander Mt, LCSW Phone Number: 03/04/2020, 5:02 PM   Clinical Narrative:    CSW spoke with pt confirmed d/c, PTAR forms and Xanax script to d/c with pt. CSW called pt daughter and gave update on voicemail. TOC team confirmed PTAR pick up and RN Lexi knows to call report.    Final next level of care: Skilled Nursing Facility Barriers to Discharge: Barriers Resolved   Patient Goals and CMS Choice Patient states their goals for this hospitalization and ongoing recovery are:: get back home when she can CMS Medicare.gov Compare Post Acute Care list provided to:: Patient Choice offered to / list presented to : Patient, Adult Children, [name]  Discharge Placement Existing PASRR number confirmed : 03/01/20          Patient chooses bed at: [facility] Patient to be transferred to facility by: [facility]  Discharge Plan and Services In-house Referral: Clinical Social Work Discharge Planning Services: CM Consult Post Acute Care Choice: [place]       Readmission Risk Interventions Readmission Risk Prevention Plan 03/01/2020  Post Dischage Appt Not Complete  Appt Comments rec for SNF  Medication Screening Complete  Transportation Screening Complete

## 2020-03-05 ENCOUNTER — Encounter: Payer: Self-pay | Admitting: Gastroenterology

## 2020-03-13 ENCOUNTER — Encounter: Payer: Self-pay | Admitting: *Deleted

## 2020-03-13 ENCOUNTER — Telehealth: Payer: Self-pay | Admitting: *Deleted

## 2020-03-13 NOTE — Telephone Encounter (Signed)
Patient enrolled for [employer] to ship 30 day cardiac event monitor to her home. Letter with instructions mailed to patient.

## 2020-03-13 NOTE — Telephone Encounter (Signed)
[facility] [place] called in attempt to set up 30 day Cardiac Event Monitor.  Was asked to allow nurse from unit to return call.

## 2020-03-13 NOTE — Telephone Encounter (Signed)
[facility] [place] returned call and stated that they have never seen a Monitor.  She stated she never unpacked so she must have had it with her .    Best number [phone]

## 2020-03-13 NOTE — Telephone Encounter (Signed)
Patient has been discharged from [facility].

## 2020-03-22 ENCOUNTER — Inpatient Hospital Stay (HOSPITAL_COMMUNITY)
Admission: EM | Admit: 2020-03-22 | Discharge: 2020-03-25 | DRG: 391 | Disposition: A | Discharge status: Home Health | Payer: Medicare Other | Attending: Internal Medicine | Admitting: Internal Medicine

## 2020-03-22 ENCOUNTER — Emergency Department (HOSPITAL_COMMUNITY): Payer: Medicare Other

## 2020-03-22 ENCOUNTER — Other Ambulatory Visit: Payer: Self-pay

## 2020-03-22 ENCOUNTER — Encounter (HOSPITAL_COMMUNITY): Payer: Self-pay | Admitting: Emergency Medicine

## 2020-03-22 DIAGNOSIS — F17210 Nicotine dependence, cigarettes, uncomplicated: Secondary | ICD-10-CM | POA: Diagnosis present

## 2020-03-22 DIAGNOSIS — Z820 Family history of epilepsy and other diseases of the nervous system: Secondary | ICD-10-CM

## 2020-03-22 DIAGNOSIS — M810 Age-related osteoporosis without current pathological fracture: Secondary | ICD-10-CM | POA: Diagnosis present

## 2020-03-22 DIAGNOSIS — I10 Essential (primary) hypertension: Secondary | ICD-10-CM | POA: Diagnosis present

## 2020-03-22 DIAGNOSIS — E7800 Pure hypercholesterolemia, unspecified: Secondary | ICD-10-CM | POA: Diagnosis present

## 2020-03-22 DIAGNOSIS — G43909 Migraine, unspecified, not intractable, without status migrainosus: Secondary | ICD-10-CM | POA: Diagnosis present

## 2020-03-22 DIAGNOSIS — Z87440 Personal history of urinary (tract) infections: Secondary | ICD-10-CM

## 2020-03-22 DIAGNOSIS — Z8542 Personal history of malignant neoplasm of other parts of uterus: Secondary | ICD-10-CM

## 2020-03-22 DIAGNOSIS — M1990 Unspecified osteoarthritis, unspecified site: Secondary | ICD-10-CM | POA: Diagnosis present

## 2020-03-22 DIAGNOSIS — Z85828 Personal history of other malignant neoplasm of skin: Secondary | ICD-10-CM

## 2020-03-22 DIAGNOSIS — Z681 Body mass index (BMI) 19 or less, adult: Secondary | ICD-10-CM

## 2020-03-22 DIAGNOSIS — K219 Gastro-esophageal reflux disease without esophagitis: Secondary | ICD-10-CM | POA: Diagnosis present

## 2020-03-22 DIAGNOSIS — R296 Repeated falls: Secondary | ICD-10-CM | POA: Diagnosis present

## 2020-03-22 DIAGNOSIS — Z91048 Other nonmedicinal substance allergy status: Secondary | ICD-10-CM

## 2020-03-22 DIAGNOSIS — K529 Noninfective gastroenteritis and colitis, unspecified: Secondary | ICD-10-CM | POA: Diagnosis not present

## 2020-03-22 DIAGNOSIS — F419 Anxiety disorder, unspecified: Secondary | ICD-10-CM | POA: Diagnosis present

## 2020-03-22 DIAGNOSIS — Z90710 Acquired absence of both cervix and uterus: Secondary | ICD-10-CM

## 2020-03-22 DIAGNOSIS — Z20822 Contact with and (suspected) exposure to covid-19: Secondary | ICD-10-CM | POA: Diagnosis present

## 2020-03-22 DIAGNOSIS — Z7982 Long term (current) use of aspirin: Secondary | ICD-10-CM

## 2020-03-22 DIAGNOSIS — E785 Hyperlipidemia, unspecified: Secondary | ICD-10-CM | POA: Diagnosis present

## 2020-03-22 DIAGNOSIS — Z96641 Presence of right artificial hip joint: Secondary | ICD-10-CM | POA: Diagnosis present

## 2020-03-22 DIAGNOSIS — E538 Deficiency of other specified B group vitamins: Secondary | ICD-10-CM | POA: Diagnosis present

## 2020-03-22 DIAGNOSIS — Z823 Family history of stroke: Secondary | ICD-10-CM

## 2020-03-22 DIAGNOSIS — I444 Left anterior fascicular block: Secondary | ICD-10-CM | POA: Diagnosis present

## 2020-03-22 DIAGNOSIS — R9431 Abnormal electrocardiogram [ECG] [EKG]: Secondary | ICD-10-CM | POA: Diagnosis present

## 2020-03-22 DIAGNOSIS — F32A Depression, unspecified: Secondary | ICD-10-CM | POA: Diagnosis present

## 2020-03-22 DIAGNOSIS — E871 Hypo-osmolality and hyponatremia: Secondary | ICD-10-CM | POA: Diagnosis present

## 2020-03-22 DIAGNOSIS — K5909 Other constipation: Secondary | ICD-10-CM | POA: Diagnosis present

## 2020-03-22 DIAGNOSIS — E43 Unspecified severe protein-calorie malnutrition: Secondary | ICD-10-CM | POA: Diagnosis present

## 2020-03-22 DIAGNOSIS — E559 Vitamin D deficiency, unspecified: Secondary | ICD-10-CM | POA: Diagnosis present

## 2020-03-22 DIAGNOSIS — Z8249 Family history of ischemic heart disease and other diseases of the circulatory system: Secondary | ICD-10-CM

## 2020-03-22 DIAGNOSIS — R64 Cachexia: Secondary | ICD-10-CM | POA: Diagnosis present

## 2020-03-22 DIAGNOSIS — Z79899 Other long term (current) drug therapy: Secondary | ICD-10-CM

## 2020-03-22 DIAGNOSIS — J449 Chronic obstructive pulmonary disease, unspecified: Secondary | ICD-10-CM | POA: Diagnosis present

## 2020-03-22 HISTORY — DX: Essential (primary) hypertension: I10

## 2020-03-22 LAB — LIPASE, BLOOD: Lipase: 24 U/L (ref 11–51)

## 2020-03-22 LAB — CBC
HCT: 43.4 % (ref 36.0–46.0)
Hemoglobin: 14.1 g/dL (ref 12.0–15.0)
MCH: 32.8 pg (ref 26.0–34.0)
MCHC: 32.5 g/dL (ref 30.0–36.0)
MCV: 100.9 fL — ABNORMAL HIGH (ref 80.0–100.0)
Platelets: 283 10*3/uL (ref 150–400)
RBC: 4.3 MIL/uL (ref 3.87–5.11)
RDW: 14.9 % (ref 11.5–15.5)
WBC: 20.3 10*3/uL — ABNORMAL HIGH (ref 4.0–10.5)
nRBC: 0 % (ref 0.0–0.2)

## 2020-03-22 LAB — MAGNESIUM: Magnesium: 2.2 mg/dL (ref 1.7–2.4)

## 2020-03-22 LAB — LACTIC ACID, PLASMA: Lactic Acid, Venous: 1.1 mmol/L (ref 0.5–1.9)

## 2020-03-22 LAB — COMPREHENSIVE METABOLIC PANEL
ALT: 17 U/L (ref 0–44)
AST: 26 U/L (ref 15–41)
Albumin: 3.3 g/dL — ABNORMAL LOW (ref 3.5–5.0)
Alkaline Phosphatase: 100 U/L (ref 38–126)
Anion gap: 10 (ref 5–15)
BUN: 16 mg/dL (ref 8–23)
CO2: 22 mmol/L (ref 22–32)
Calcium: 8.4 mg/dL — ABNORMAL LOW (ref 8.9–10.3)
Chloride: 101 mmol/L (ref 98–111)
Creatinine, Ser: 0.83 mg/dL (ref 0.44–1.00)
GFR, Estimated: >60 mL/min (ref >60)
Glucose, Bld: 98 mg/dL (ref 70–99)
Potassium: 4.5 mmol/L (ref 3.5–5.1)
Sodium: 133 mmol/L — ABNORMAL LOW (ref 135–145)
Total Bilirubin: 0.5 mg/dL (ref 0.3–1.2)
Total Protein: 5.7 g/dL — ABNORMAL LOW (ref 6.5–8.1)

## 2020-03-22 LAB — RESPIRATORY PANEL BY RT PCR (FLU A&B, COVID)
Influenza A by PCR: NEGATIVE
Influenza B by PCR: NEGATIVE
SARS Coronavirus 2 by RT PCR: NEGATIVE

## 2020-03-22 LAB — CBG MONITORING, ED: Glucose-Capillary: 105 mg/dL — ABNORMAL HIGH (ref 70–99)

## 2020-03-22 MED ORDER — FLUTICASONE PROPIONATE 50 MCG/ACT NA SUSP
2.0000 | Freq: Every day | NASAL | Status: DC | PRN
  Administered 2020-03-23: 2 via NASAL
  Filled 2020-03-22: qty 16

## 2020-03-22 MED ORDER — ALPRAZOLAM 0.25 MG PO TABS
0.2500 mg | ORAL_TABLET | Freq: Two times a day (BID) | ORAL | Status: DC | PRN
  Administered 2020-03-23 – 2020-03-24 (×2): 0.25 mg via ORAL
  Filled 2020-03-22 (×2): qty 1

## 2020-03-22 MED ORDER — CIPROFLOXACIN HCL 500 MG PO TABS: 750.0000 mg | ORAL_TABLET | Freq: Every day | ORAL | Status: DC

## 2020-03-22 MED ORDER — ADULT MULTIVITAMIN W/MINERALS CH
1.0000 | ORAL_TABLET | Freq: Every day | ORAL | Status: DC
  Administered 2020-03-24 – 2020-03-25 (×2): 1 via ORAL
  Filled 2020-03-22 (×3): qty 1

## 2020-03-22 MED ORDER — PIPERACILLIN-TAZOBACTAM 3.375 G IVPB 30 MIN
3.3750 g | Freq: Once | INTRAVENOUS | Status: AC
  Administered 2020-03-22: 3.375 g via INTRAVENOUS
  Filled 2020-03-22: qty 50

## 2020-03-22 MED ORDER — ACETAMINOPHEN 325 MG PO TABS
650.0000 mg | ORAL_TABLET | Freq: Four times a day (QID) | ORAL | Status: DC | PRN
  Filled 2020-03-22: qty 2

## 2020-03-22 MED ORDER — SODIUM CHLORIDE 0.9 % IV SOLN
INTRAVENOUS | Status: DC
  Administered 2020-03-22: 125 mL/h via INTRAVENOUS

## 2020-03-22 MED ORDER — ENOXAPARIN SODIUM 30 MG/0.3ML ~~LOC~~ SOLN
30.0000 mg | SUBCUTANEOUS | Status: DC
  Administered 2020-03-23 – 2020-03-24 (×2): 30 mg via SUBCUTANEOUS
  Filled 2020-03-22 (×2): qty 0.3

## 2020-03-22 MED ORDER — TERIPARATIDE (RECOMBINANT) 600 MCG/2.4ML ~~LOC~~ SOLN: 20.0000 ug | Freq: Every day | SUBCUTANEOUS | Status: DC

## 2020-03-22 MED ORDER — SODIUM CHLORIDE 0.9 % IV SOLN
2.0000 g | INTRAVENOUS | Status: DC
  Administered 2020-03-23 – 2020-03-24 (×2): 2 g via INTRAVENOUS
  Filled 2020-03-22 (×2): qty 20

## 2020-03-22 MED ORDER — ATORVASTATIN CALCIUM 10 MG PO TABS
10.0000 mg | ORAL_TABLET | Freq: Every day | ORAL | Status: DC
  Administered 2020-03-23 – 2020-03-25 (×3): 10 mg via ORAL
  Filled 2020-03-22 (×3): qty 1

## 2020-03-22 MED ORDER — ENSURE ENLIVE PO LIQD
237.0000 mL | Freq: Two times a day (BID) | ORAL | Status: DC
  Administered 2020-03-23 – 2020-03-25 (×2): 237 mL via ORAL
  Filled 2020-03-22 (×2): qty 237

## 2020-03-22 MED ORDER — VITAMIN D 25 MCG (1000 UNIT) PO TABS
1000.0000 [IU] | ORAL_TABLET | Freq: Every day | ORAL | Status: DC
  Administered 2020-03-23 – 2020-03-25 (×3): 1000 [IU] via ORAL
  Filled 2020-03-22 (×3): qty 1

## 2020-03-22 MED ORDER — SODIUM CHLORIDE 0.9 % IV SOLN: 2.0000 g | INTRAVENOUS | Status: DC

## 2020-03-22 MED ORDER — ACETAMINOPHEN 650 MG RE SUPP: 650.0000 mg | Freq: Four times a day (QID) | RECTAL | Status: DC | PRN

## 2020-03-22 MED ORDER — IOHEXOL 300 MG/ML  SOLN
80.0000 mL | Freq: Once | INTRAMUSCULAR | Status: AC | PRN
  Administered 2020-03-22: 80 mL via INTRAVENOUS

## 2020-03-22 MED ORDER — PANTOPRAZOLE SODIUM 40 MG PO TBEC
40.0000 mg | DELAYED_RELEASE_TABLET | Freq: Every day | ORAL | Status: DC
  Administered 2020-03-23 – 2020-03-25 (×3): 40 mg via ORAL
  Filled 2020-03-22 (×3): qty 1

## 2020-03-22 NOTE — ED Provider Notes (Signed)
[facility] EMERGENCY DEPARTMENT Provider Note   CSN: [id number] Arrival date & time: 03/22/20  1514     History Chief Complaint  Patient presents with  . Emesis  . Diarrhea    Holly CRANDELL is a 79 y.o. female.  Patient with history of COPD, UTI, hypertension, high cholesterol --presents emergency department for evaluation of nausea, vomiting, diarrhea starting yesterday.  Patient states that she was with her daughter who is visiting.  Patient reports having multiple episodes of vomiting and diarrhea since yesterday.  She is unable to tell me when her last episode of these were.  No blood reported in the stool or the vomit.  No chest pain or shortness of breath.  No abdominal pains.  No fevers.  No UTI symptoms.  Patient was admitted earlier in the month for syncopal episode.  She states that she has been dealing with concussion symptoms (forgetfulness and frequent falls) since that time.  No recent antibiotic use.  Spoke with patient's daughter [name] who is visiting from [place] until this weekend to assist patient after she was discharged from [facility].  She states that patient reported being up all night with nausea and vomiting.  She cannot give details as to how much.  When she admitted to the house around noon today, patient was partially dressed.  She was unable to dress herself due to weakness.  She got ready to bring her to the emergency department, however she could not walk more than 2 steps without needing to sit down due to weakness.  Ambulance was then called for transport to the hospital.        Past Medical History:  Diagnosis Date  . Arthritis   . Basal cell adenocarcinoma    right hand  . Complication of anesthesia    SEVERE nausea vomiting  . COPD (chronic obstructive pulmonary disease) ([place])   . Depression    no per pt 02-27-16  . DJD (degenerative joint disease)   . Endometrial cancer ([place])   . Essential hypertension   . Genital  warts   . Headache(784.0)    HX MIGRAINES  . HLD (hyperlipidemia)   . Hyperlipidemia   . Hypertension   . Migraine headache    Occular   . Migraines   . Osteoporosis hypetens  . Recurrent UTI   . Tobacco abuse   . Vitamin B12 deficiency   . Vitamin D deficiency     Patient Active Problem List   Diagnosis Date Noted  . Syncope 02/28/2020  . Essential hypertension 02/28/2020  . Hyponatremia   . Pain   . Weakness   . Protein-calorie malnutrition, severe ([place]) 04/19/2014  . Sacral back pain   . Failure to thrive in adult 04/18/2014  . Tachycardia 04/18/2014  . Vitamin D deficiency 04/18/2014  . Hyperlipidemia 04/18/2014  . Tobacco abuse 04/18/2014  . Fall 04/17/2014  . Sacral fracture, closed ([place])   . Pure hypercholesterolemia 01/16/2014  . Esophageal reflux 01/16/2014  . Other B-complex deficiencies 01/16/2014  . Arthritis of right hip 01/12/2014  . 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 Syncope 11/21/2011  . Ribs, multiple fractures 11/21/2011  . Leukocytosis 11/21/2011  . Depression 11/21/2011    Past Surgical History:  Procedure Laterality Date  . ABDOMINAL HYSTERECTOMY    . APPENDECTOMY    . CARPAL TUNNEL RELEASE    . CESAREAN SECTION    . COLONOSCOPY    . KYPHOPLASTY    . THYROID  SURGERY     nodule removal  . TOTAL HIP ARTHROPLASTY Right 01/12/2014   Procedure: RIGHT TOTAL HIP ARTHROPLASTY ANTERIOR APPROACH;  Surgeon: Mcarthur Rossetti, MD;  Location: [facility] ORS;  Service: Orthopedics;  Laterality: Right;  Marland Kitchen VERTEBROPLASTY       OB History   No obstetric history on file.     Family History  Problem Relation Age of Onset  . Alzheimer's disease Mother   . Heart disease Mother   . Stroke Father   . Colon cancer Neg Hx   . Esophageal cancer Neg Hx   . Stomach cancer Neg Hx   . Rectal cancer Neg Hx     Social History   Tobacco Use  . Smoking status: Current Every Day Smoker    Packs/day: 0.50    Years: 50.00    Pack  years: 25.00    Types: Cigarettes  . Smokeless tobacco: Never Used  Substance Use Topics  . Alcohol use: Never  . Drug use: No    Home Medications Prior to Admission medications   Medication Sig Start Date End Date Taking? Authorizing Provider  ALPRAZolam Duanne Moron) 0.5 MG tablet Take 1 tablet (0.5 mg total) by mouth at bedtime. 04/20/14   Mikhail, Velta Addison, DO  ALPRAZolam Duanne Moron) 0.5 MG tablet Take 0.5-1 tablets (0.25-0.5 mg total) by mouth 3 (three) times daily as needed for anxiety. 03/04/20   Geradine Girt, DO  aspirin EC 81 MG tablet Take 81 mg by mouth daily.    [provider]  atorvastatin (LIPITOR) 10 MG tablet Take 10 mg by mouth daily.    [provider]  atorvastatin (LIPITOR) 10 MG tablet Take 10 mg by mouth daily. 01/03/20   [provider]  butalbital-acetaminophen-caffeine (FIORICET) 50-325-40 MG tablet Take 1 tablet by mouth 4 (four) times daily as needed for migraine.  02/11/20   [provider]  butalbital-acetaminophen-caffeine (FIORICET, ESGIC) 50-325-40 MG per tablet Take 1 tablet by mouth 2 (two) times daily as needed for headache.  04/20/14   [provider]  cholecalciferol (VITAMIN D) 1000 UNITS tablet Take 1,000 Units by mouth daily.    [provider]  cyanocobalamin (,VITAMIN B-12,) 1000 MCG/ML injection Inject 1,000 mcg into the muscle every 30 (thirty) days.     [provider]  diclofenac (VOLTAREN) 75 MG EC tablet TAKE 1 TABLET BY MOUTH TWICE A DAY WITH MEALS FOR INFLAMMATION/SWELLING 02/08/20   Mcarthur Rossetti, MD  feeding supplement, ENSURE COMPLETE, (ENSURE COMPLETE) LIQD Take 237 mLs by mouth 2 (two) times daily between meals. 04/20/14   Mikhail, Velta Addison, DO  fluticasone (VERAMYST) 27.5 MCG/SPRAY nasal spray Place 2 sprays into the nose daily.    [provider]  LINZESS 145 MCG CAPS capsule TAKE 1 CAPSULE (145 MCG TOTAL) BY MOUTH DAILY BEFORE BREAKFAST. 04/28/16   Doran Stabler,  MD  losartan (COZAAR) 50 MG tablet Take 50 mg by mouth daily. 02/21/20   [provider]  meclizine (ANTIVERT) 12.5 MG tablet Take 1 tablet (12.5 mg total) by mouth 3 (three) times daily as needed for dizziness. 03/04/20   Geradine Girt, DO  mirtazapine (REMERON) 15 MG tablet Take 7.5 mg by mouth at bedtime.  01/03/20   [provider]  Multiple Vitamin (MULTIVITAMIN WITH MINERALS) TABS tablet Take 1 tablet by mouth daily. 03/05/20   Geradine Girt, DO  Multiple Vitamin (MULTIVITAMIN WITH MINERALS) TABS Take 1 tablet by mouth every evening.     [provider]  NON FORMULARY otc laxatives- pt unsure of what she takes but does have to take OTC laxative    [provider]  pantoprazole (PROTONIX) 40 MG tablet Take 40 mg by mouth daily.    [provider]  pseudoephedrine (SUDAFED) 30 MG tablet Take 30 mg by mouth every 4 (four) hours as needed for congestion.    [provider]  Teriparatide, Recombinant, (FORTEO) 600 MCG/2.4ML SOLN Inject 20 mcg into the skin daily.    [provider]    Allergies    Adhesive [tape]  Review of Systems   Review of Systems  Constitutional: Negative for fever.  HENT: Negative for rhinorrhea and sore throat.   Eyes: Negative for redness.  Respiratory: Negative for cough.   Cardiovascular: Negative for chest pain.  Gastrointestinal: Positive for diarrhea, nausea and vomiting. Negative for abdominal pain.  Genitourinary: Negative for dysuria, frequency, hematuria and urgency.  Musculoskeletal: Negative for myalgias.  Skin: Negative for rash.  Neurological: Negative for headaches.    Physical Exam Updated Vital Signs Ht 5\' 5"  (1.651 m)   Wt 38.8 kg   BMI 14.23 kg/m   Physical Exam Vitals and nursing note reviewed.  Constitutional:      General: She is not in acute distress.    Appearance: She is well-developed.     Comments: Cachectic  HENT:     Head: Normocephalic and atraumatic.      Right Ear: External ear normal.     Left Ear: External ear normal.     Nose: Nose normal.  Eyes:     Conjunctiva/sclera: Conjunctivae normal.  Cardiovascular:     Rate and Rhythm: Normal rate and regular rhythm.     Heart sounds: No murmur heard.   Pulmonary:     Effort: No respiratory distress.     Breath sounds: No wheezing, rhonchi or rales.  Abdominal:     Palpations: Abdomen is soft.     Tenderness: There is abdominal tenderness. There is no guarding or rebound.     Comments: Mild generalized tenderness  Musculoskeletal:     Cervical back: Normal range of motion and neck supple.     Right lower leg: No edema.     Left lower leg: No edema.  Skin:    General: Skin is warm and dry.     Findings: No rash.  Neurological:     General: No focal deficit present.     Mental Status: She is alert. Mental status is at baseline.     Motor: No weakness.  Psychiatric:        Mood and Affect: Mood normal.     ED Results / Procedures / Treatments   Labs (all labs ordered are listed, but only abnormal results are displayed) Labs Reviewed  COMPREHENSIVE METABOLIC PANEL - Abnormal; Notable for the following components:      Result Value   Sodium 133 (*)    Calcium 8.4 (*)    Total Protein 5.7 (*)    Albumin 3.3 (*)    All other components within normal limits  CBC - Abnormal; Notable for the following components:   WBC 20.3 (*)    MCV 100.9 (*)    All other components within normal limits  CBG MONITORING, ED - Abnormal; Notable for the following components:   Glucose-Capillary 105 (*)    All other components within normal limits  CULTURE, BLOOD (ROUTINE X 2)  CULTURE, BLOOD (ROUTINE X 2)  RESPIRATORY PANEL BY RT  PCR (FLU A&B, COVID)  LIPASE, BLOOD  URINALYSIS, ROUTINE W REFLEX MICROSCOPIC  MAGNESIUM  LACTIC ACID, PLASMA    EKG EKG Interpretation  Date/Time:  Friday March 22 2020 15:30:28 EDT Ventricular Rate:  81 PR Interval:    QRS Duration: 70 QT  Interval:  479 QTC Calculation: 557 R Axis:   -105 Text Interpretation: Sinus rhythm Left anterior fascicular block Anterior infarct, old Prolonged QT interval No STEMI Confirmed by Octaviano Glow [phone]) on 03/22/2020 3:44:57 PM   Radiology CT Head Wo Contrast  Result Date: 03/22/2020 CLINICAL DATA:  Mental status changes EXAM: CT HEAD WITHOUT CONTRAST TECHNIQUE: Contiguous axial images were obtained from the base of the skull through the vertex without intravenous contrast. COMPARISON:  02/28/2020 FINDINGS: Brain: There is atrophy and chronic small vessel disease changes. No acute intracranial abnormality. Specifically, no hemorrhage, hydrocephalus, mass lesion, acute infarction, or significant intracranial injury. Vascular: No hyperdense vessel or unexpected calcification. Skull: No acute calvarial abnormality. Sinuses/Orbits: No acute findings Other: None IMPRESSION: Atrophy, chronic microvascular disease. No acute intracranial abnormality. Electronically Signed   By: Rolm Baptise M.D.   On: 03/22/2020 17:42   CT ABDOMEN PELVIS W CONTRAST  Result Date: 03/22/2020 CLINICAL DATA:  Nausea, vomiting EXAM: CT ABDOMEN AND PELVIS WITH CONTRAST TECHNIQUE: Multidetector CT imaging of the abdomen and pelvis was performed using the standard protocol following bolus administration of intravenous contrast. CONTRAST:  74mL OMNIPAQUE IOHEXOL 300 MG/ML  SOLN COMPARISON:  11/30/2014 FINDINGS: Lower chest: Scarring in the lung bases.  No acute abnormality. Hepatobiliary: Enhancing lesion in the right hepatic dome measures 2.7 cm. Peripheral puddling of contrast compatible with hemangioma. Benign appearing cyst in the lower right hepatic lobe measures 1.4 cm. Small layering gallstones within the gallbladder. No biliary ductal dilatation. Pancreas: No focal abnormality or ductal dilatation. Spleen: No focal abnormality.  Normal size. Adrenals/Urinary Tract: Small cysts in the lower pole of the right kidney and upper  pole of the left kidney. No hydronephrosis. Adrenal glands and urinary bladder unremarkable. Right kidney is low, within the upper pelvis. Stomach/Bowel: Large stool burden throughout the colon. Stomach and small bowel decompressed, grossly unremarkable. There is mild wall thickening involving the distal transverse colon, splenic flexure and descending colon concerning for colitis. Vascular/Lymphatic: Heavily calcified aorta and iliac vessels. No evidence of aneurysm or adenopathy. Reproductive: No visible mass. Lower pelvis obscured by beam hardening artifact from right hip replacement. Other: No free fluid or free air. Musculoskeletal: Right hip replacement. Prior bilateral sacroplasty. No acute bony abnormality. IMPRESSION: Areas of wall thickening from the distal transverse colon through the descending colon concerning for colitis. Cholelithiasis. Large stool burden throughout the colon. Aortic atherosclerosis. Electronically Signed   By: Rolm Baptise M.D.   On: 03/22/2020 17:51    Procedures Procedures (including critical care time)  Medications Ordered in ED Medications  piperacillin-tazobactam (ZOSYN) IVPB 3.375 g (3.375 g Intravenous New Bag/Given 03/22/20 1835)  0.9 %  sodium chloride infusion (125 mL/hr Intravenous New Bag/Given 03/22/20 1839)  iohexol (OMNIPAQUE) 300 MG/ML solution 80 mL (80 mLs Intravenous Contrast Given 03/22/20 1744)    ED Course  I have reviewed the triage vital signs and the nursing notes.  Pertinent labs & imaging results that were available during my care of the patient were reviewed by me and considered in my medical decision making (see chart for details).  Patient seen and examined. Work-up initiated. Medications ordered.   Vital signs reviewed and are as follows: BP (!) 135/101   Pulse 78  Temp 98.3 F (36.8 C) (Oral)   Resp 18   Ht 5\' 5"  (1.651 m)   Wt 38.8 kg   SpO2 95%   BMI 14.23 kg/m   WBC count = 20k. CT abd/pelvis ordered. Dr. Tyrone Nine has  seen patient and had discussion with daughter.   6:43 PM I updated the patient on her results.  Recommended inpatient treatment.  At this point, she states that she is not willing to stay past tomorrow morning.   I had a good discussion with the patient's daughter.  She is here trying to sure up the patient's living situation, cleaning her condo, setting up Meals on Wheels, to help her care for herself at home.  She will be available by telephone if needed.  7:02 PM Spoke with [facility] regarding admission. They will see.     MDM Rules/Calculators/A&P                          Admit.   Final Clinical Impression(s) / ED Diagnoses Final diagnoses:  Colitis    Rx / DC Orders ED Discharge Orders    None       Carlisle Cater, PA-C 03/22/20 [place], [place], DO 03/22/20 2034

## 2020-03-22 NOTE — ED Notes (Signed)
Pt ambulatory for orthostatic vitals with minimal assistance.

## 2020-03-22 NOTE — ED Triage Notes (Signed)
Pt c/o nausea, vomiting and diarrhea since last night, getting more fatigue and weak today. Initial BP 90/60 500 mL NS given by EMS pta. HR  100, SPO2 98% RA.

## 2020-03-22 NOTE — H&P (Addendum)
History and Physical   Holly Valenzuela SHF:[id number] DOB: 20-Dec-1940 DOA: 03/22/2020  PCP: Crist Infante, MD   Patient coming from: Home  Chief Complaint: Nausea, Vomiting, Diarrhea  HPI: Holly Valenzuela is a 79 y.o. female with medical history significant of hypertension, hyperlipidemia, tobacco use, COPD, GERD, protein calorie malnutrition, recent admissions for syncope with subsequent rehab stay who presented with 1 day of nausea vomiting diarrhea.  She returned home from rehab about 10 days ago, her daughter is currently in town helping her get make arrangements to function better at home alone.  Patient states that her symptoms began last night, but she has difficulty remembering them.  Her daughter was contacted by phone and states that the patient told her this morning that overnight she was up all night with nausea vomiting and diarrhea.  Even this morning, the patient was unable to give details to the ED frequency, form or other characteristics of her vomitus nor diarrhea.  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  There is also concern that the many items the patient keeps her on her home may pose a fall risk.  Patient denies further vomiting or diarrhea this evening in the ED.  She denies fevers chills, abdominal pain, chest pain, shortness of breath.  ED Course: Vital signs stable in the ED.  Lab work showed mild hyponatremia 133, significant leukocytosis of 20, calcium of 8.4 which corrects to normal with albumin of 3.3, protein 5.7.  Lipase is within normal limits.  Lactic acid and respiratory panel are pending.  EKG showed sinus rhythm with prolonged QT of 557.  CT head showed no acute changes.  CT abdomen showed colitis and large stool burden.  Review of Systems: As per HPI otherwise all other systems reviewed and are negative.  Past Medical History:    Diagnosis Date  . Arthritis   . Basal cell adenocarcinoma    right hand  . Complication of anesthesia    SEVERE nausea vomiting  . COPD (chronic obstructive pulmonary disease) ([place])   . Depression    no per pt 02-27-16  . DJD (degenerative joint disease)   . Endometrial cancer ([place])   . Essential hypertension   . Genital warts   . Headache(784.0)    HX MIGRAINES  . HLD (hyperlipidemia)   . Hyperlipidemia   . Hypertension   . Migraine headache    Occular   . Migraines   . Osteoporosis hypetens  . Recurrent UTI   . Tobacco abuse   . Vitamin B12 deficiency   . Vitamin D deficiency     Past Surgical History:  Procedure Laterality Date  . ABDOMINAL HYSTERECTOMY    . APPENDECTOMY    . CARPAL TUNNEL RELEASE    . CESAREAN SECTION    . COLONOSCOPY    . KYPHOPLASTY    . THYROID SURGERY     nodule removal  . TOTAL HIP ARTHROPLASTY Right 01/12/2014   Procedure: RIGHT TOTAL HIP ARTHROPLASTY ANTERIOR APPROACH;  Surgeon: Mcarthur Rossetti, MD;  Location: [facility];  Service: Orthopedics;  Laterality: Right;  Marland Kitchen VERTEBROPLASTY      Social History  reports that she has been smoking cigarettes. She has a 25.00 pack-year smoking history. She has never used smokeless tobacco. She reports that she does not drink alcohol and does not use drugs.  Allergies  Allergen Reactions  .  Adhesive [Tape] Itching    Family History  Problem Relation Age of Onset  . Alzheimer's disease Mother   . Heart disease Mother   . Stroke Father   . Colon cancer Neg Hx   . Esophageal cancer Neg Hx   . Stomach cancer Neg Hx   . Rectal cancer Neg Hx   Reviewed on admission  Prior to Admission medications   Medication Sig Start Date End Date Taking? Authorizing Provider  ALPRAZolam Duanne Moron) 0.5 MG tablet Take 1 tablet (0.5 mg total) by mouth at bedtime. 04/20/14   Mikhail, Velta Addison, DO  ALPRAZolam Duanne Moron) 0.5 MG tablet Take 0.5-1 tablets (0.25-0.5 mg total) by mouth 3 (three) times daily as needed for  anxiety. 03/04/20   Geradine Girt, DO  aspirin EC 81 MG tablet Take 81 mg by mouth daily.    [provider]  atorvastatin (LIPITOR) 10 MG tablet Take 10 mg by mouth daily.    [provider]  atorvastatin (LIPITOR) 10 MG tablet Take 10 mg by mouth daily. 01/03/20   [provider]  butalbital-acetaminophen-caffeine (FIORICET) 50-325-40 MG tablet Take 1 tablet by mouth 4 (four) times daily as needed for migraine.  02/11/20   [provider]  butalbital-acetaminophen-caffeine (FIORICET, ESGIC) 50-325-40 MG per tablet Take 1 tablet by mouth 2 (two) times daily as needed for headache.  04/20/14   [provider]  cholecalciferol (VITAMIN D) 1000 UNITS tablet Take 1,000 Units by mouth daily.    [provider]  cyanocobalamin (,VITAMIN B-12,) 1000 MCG/ML injection Inject 1,000 mcg into the muscle every 30 (thirty) days.     [provider]  diclofenac (VOLTAREN) 75 MG EC tablet TAKE 1 TABLET BY MOUTH TWICE A DAY WITH MEALS FOR INFLAMMATION/SWELLING 02/08/20   Mcarthur Rossetti, MD  feeding supplement, ENSURE COMPLETE, (ENSURE COMPLETE) LIQD Take 237 mLs by mouth 2 (two) times daily between meals. 04/20/14   Mikhail, Velta Addison, DO  fluticasone (VERAMYST) 27.5 MCG/SPRAY nasal spray Place 2 sprays into the nose daily.    [provider]  LINZESS 145 MCG CAPS capsule TAKE 1 CAPSULE (145 MCG TOTAL) BY MOUTH DAILY BEFORE BREAKFAST. 04/28/16   Doran Stabler, MD  losartan (COZAAR) 50 MG tablet Take 50 mg by mouth daily. 02/21/20   [provider]  meclizine (ANTIVERT) 12.5 MG tablet Take 1 tablet (12.5 mg total) by mouth 3 (three) times daily as needed for dizziness. 03/04/20   Geradine Girt, DO  mirtazapine (REMERON) 15 MG tablet Take 7.5 mg by mouth at bedtime.  01/03/20   [provider]  Multiple Vitamin (MULTIVITAMIN WITH MINERALS) TABS tablet Take 1 tablet by mouth daily. 03/05/20   Geradine Girt, DO  Multiple  Vitamin (MULTIVITAMIN WITH MINERALS) TABS Take 1 tablet by mouth every evening.     [provider]  NON FORMULARY otc laxatives- pt unsure of what she takes but does have to take OTC laxative    [provider]  pantoprazole (PROTONIX) 40 MG tablet Take 40 mg by mouth daily.    [provider]  pseudoephedrine (SUDAFED) 30 MG tablet Take 30 mg by mouth every 4 (four) hours as needed for congestion.    [provider]  Teriparatide, Recombinant, (FORTEO) 600 MCG/2.4ML SOLN Inject 20 mcg into the skin daily.    [provider]    Physical Exam: Vitals:   03/22/20 1821 03/22/20 1945 03/22/20 2000 03/22/20 2015  BP: (!) 135/101 111/87 121/80 (!) 128/98  Pulse:  79 73 70  Resp: 18 18 18  (!) 21  Temp:      TempSrc:      SpO2:  94% 96% 95%  Weight:      Height:       Physical Exam Constitutional:      General: She is not in acute distress.    Appearance: Normal appearance.     Comments: Thin, undernourished, elderly female  HENT:     Head: Normocephalic and atraumatic.     Mouth/Throat:     Mouth: Mucous membranes are moist.     Pharynx: Oropharynx is clear.  Eyes:     Extraocular Movements: Extraocular movements intact.     Pupils: Pupils are equal, round, and reactive to light.  Cardiovascular:     Rate and Rhythm: Normal rate and regular rhythm.     Pulses: Normal pulses.     Heart sounds: Normal heart sounds.  Pulmonary:     Effort: Pulmonary effort is normal. No respiratory distress.     Breath sounds: Normal breath sounds.  Abdominal:     General: Bowel sounds are normal. There is no distension.     Palpations: Abdomen is soft.     Tenderness: There is no abdominal tenderness.  Musculoskeletal:        General: No swelling or deformity.  Skin:    General: Skin is warm and dry.  Neurological:     General: No focal deficit present.     Mental Status: Mental status is at baseline.    Labs on Admission: I have personally  reviewed following labs and imaging studies  CBC: Recent Labs  Lab 03/22/20 1523  WBC 20.3*  HGB 14.1  HCT 43.4  MCV 100.9*  PLT 622    Basic Metabolic Panel: Recent Labs  Lab 03/22/20 1523 03/22/20 1844  NA 133*  --   K 4.5  --   CL 101  --   CO2 22  --   GLUCOSE 98  --   BUN 16  --   CREATININE 0.83  --   CALCIUM 8.4*  --   MG  --  2.2    GFR: Estimated Creatinine Clearance: 33.1 mL/min (by C-G formula based on SCr of 0.83 mg/dL).  Liver Function Tests: Recent Labs  Lab 03/22/20 1523  AST 26  ALT 17  ALKPHOS 100  BILITOT 0.5  PROT 5.7*  ALBUMIN 3.3*    Urine analysis:    Component Value Date/Time   COLORURINE YELLOW 02/28/2020 2129   APPEARANCEUR HAZY (A) 02/28/2020 2129   LABSPEC 1.011 02/28/2020 2129   PHURINE 7.0 02/28/2020 2129   GLUCOSEU NEGATIVE 02/28/2020 2129   HGBUR SMALL (A) 02/28/2020 2129   BILIRUBINUR NEGATIVE 02/28/2020 2129   KETONESUR NEGATIVE 02/28/2020 2129   PROTEINUR NEGATIVE 02/28/2020 2129   UROBILINOGEN 0.2 04/19/2014 1253   NITRITE POSITIVE (A) 02/28/2020 2129   LEUKOCYTESUR MODERATE (A) 02/28/2020 2129    Radiological Exams on Admission: CT Head Wo Contrast  Result Date: 03/22/2020 CLINICAL DATA:  Mental status changes EXAM: CT HEAD WITHOUT CONTRAST TECHNIQUE: Contiguous axial images were obtained from the base of the skull through the vertex without intravenous contrast. COMPARISON:  02/28/2020 FINDINGS: Brain: There is atrophy and chronic small vessel disease changes. No acute intracranial abnormality. Specifically, no hemorrhage, hydrocephalus, mass lesion, acute infarction, or significant intracranial injury. Vascular: No hyperdense vessel or unexpected calcification. Skull: No acute calvarial abnormality. Sinuses/Orbits: No acute findings Other: None IMPRESSION: Atrophy, chronic microvascular disease.  No acute intracranial abnormality. Electronically Signed   By: Rolm Baptise M.D.   On: 03/22/2020 17:42   CT ABDOMEN  PELVIS W CONTRAST  Result Date: 03/22/2020 CLINICAL DATA:  Nausea, vomiting EXAM: CT ABDOMEN AND PELVIS WITH CONTRAST TECHNIQUE: Multidetector CT imaging of the abdomen and pelvis was performed using the standard protocol following bolus administration of intravenous contrast. CONTRAST:  41mL OMNIPAQUE IOHEXOL 300 MG/ML  SOLN COMPARISON:  11/30/2014 FINDINGS: Lower chest: Scarring in the lung bases.  No acute abnormality. Hepatobiliary: Enhancing lesion in the right hepatic dome measures 2.7 cm. Peripheral puddling of contrast compatible with hemangioma. Benign appearing cyst in the lower right hepatic lobe measures 1.4 cm. Small layering gallstones within the gallbladder. No biliary ductal dilatation. Pancreas: No focal abnormality or ductal dilatation. Spleen: No focal abnormality.  Normal size. Adrenals/Urinary Tract: Small cysts in the lower pole of the right kidney and upper pole of the left kidney. No hydronephrosis. Adrenal glands and urinary bladder unremarkable. Right kidney is low, within the upper pelvis. Stomach/Bowel: Large stool burden throughout the colon. Stomach and small bowel decompressed, grossly unremarkable. There is mild wall thickening involving the distal transverse colon, splenic flexure and descending colon concerning for colitis. Vascular/Lymphatic: Heavily calcified aorta and iliac vessels. No evidence of aneurysm or adenopathy. Reproductive: No visible mass. Lower pelvis obscured by beam hardening artifact from right hip replacement. Other: No free fluid or free air. Musculoskeletal: Right hip replacement. Prior bilateral sacroplasty. No acute bony abnormality. IMPRESSION: Areas of wall thickening from the distal transverse colon through the descending colon concerning for colitis. Cholelithiasis. Large stool burden throughout the colon. Aortic atherosclerosis. Electronically Signed   By: Rolm Baptise M.D.   On: 03/22/2020 17:51    EKG: Independently reviewed. Sinus rhythm with  prolonged QT of 557  Assessment/Plan Principal Problem:   Colitis Active Problems:   Depression   Esophageal reflux   Protein-calorie malnutrition, severe (HCC)   Essential hypertension   Colitis - 1 day of nausea vomiting diarrhea per her daughter. - Colitis demonstrated on CT abdomen, also noted is large stool burden - Leukocytosis to 20 in ED, will trend CBC - Patient currently denies any abdominal pain and denies any nausea vomiting or diarrhea in the ED - Received dose of IV Zosyn in ED, transition to p.o. Ceftriaxone in the AM (not using azithro or cipro due to QTC).  Hypertension -Hold home losartan given soft BP initially.  Hyperlipidemia -Continue home atorvastatin  Migraines - Continue as needed Fioricet  GERD - Continue home PPI  Anxiety - Continue home low-dose as needed Xanax  History of thyroid surgery - History of thyroid surgery noted in chart and is on teriparatide though no diagnosis of hypothyroidism could be found - Continue home teriparatide  Generalized weakness Protein calorie malnutrition - Have ordered transitions of care, PT and OT - Albumin 3.3 on admission - Continue home Ensure  DVT prophylaxis: Lovenox Code Status:   Full Family Communication:  Daughter, Holly Valenzuela, contacted by phone at [phone]. Disposition Plan:   Patient is from:  Home  Anticipated DC to:  Home  Anticipated DC date:  10/23    Consults called:  None  Admission status:  Observation, telemetry   Severity of Illness: The appropriate patient status for this patient is OBSERVATION. Observation status is judged to be reasonable and necessary in order to provide the required intensity of service to ensure the patient's safety. The patient's presenting symptoms, physical exam findings, and initial radiographic and laboratory data  in the context of their medical condition is felt to place them at decreased risk for further clinical deterioration. Furthermore, it is  anticipated that the patient will be medically stable for discharge from the hospital within 2 midnights of admission. The following factors support the patient status of observation.   " The patient's presenting symptoms include nausea vomiting diarrhea per family report. " The physical exam findings include thin elderly female. " The initial radiographic and laboratory data are consistent with colitis.   Marcelyn Bruins MD [facility]  How to contact the [facility] Attending or Consulting provider [place] or covering provider during after hours [place], for this patient?   1. Check the care team in [facility] and look for a) attending/consulting [facility] provider listed and b) the [facility] team listed 2. Log into [email or web address] and use [place]'s universal password to access. If you do not have the password, please contact the hospital operator. 3. Locate the [facility] provider you are looking for under [facility] and page to a number that you can be directly reached. 4. If you still have difficulty reaching the provider, please page the [facility] (Director on Call) for the Hospitalists listed on amion for assistance.  03/22/2020, 9:18 PM

## 2020-03-23 DIAGNOSIS — F17210 Nicotine dependence, cigarettes, uncomplicated: Secondary | ICD-10-CM | POA: Diagnosis present

## 2020-03-23 DIAGNOSIS — K5909 Other constipation: Secondary | ICD-10-CM | POA: Diagnosis present

## 2020-03-23 DIAGNOSIS — I10 Essential (primary) hypertension: Secondary | ICD-10-CM | POA: Diagnosis present

## 2020-03-23 DIAGNOSIS — J449 Chronic obstructive pulmonary disease, unspecified: Secondary | ICD-10-CM | POA: Diagnosis present

## 2020-03-23 DIAGNOSIS — Z96641 Presence of right artificial hip joint: Secondary | ICD-10-CM | POA: Diagnosis present

## 2020-03-23 DIAGNOSIS — K219 Gastro-esophageal reflux disease without esophagitis: Secondary | ICD-10-CM | POA: Diagnosis present

## 2020-03-23 DIAGNOSIS — Z8249 Family history of ischemic heart disease and other diseases of the circulatory system: Secondary | ICD-10-CM | POA: Diagnosis not present

## 2020-03-23 DIAGNOSIS — Z681 Body mass index (BMI) 19 or less, adult: Secondary | ICD-10-CM | POA: Diagnosis not present

## 2020-03-23 DIAGNOSIS — R9431 Abnormal electrocardiogram [ECG] [EKG]: Secondary | ICD-10-CM | POA: Diagnosis present

## 2020-03-23 DIAGNOSIS — M1990 Unspecified osteoarthritis, unspecified site: Secondary | ICD-10-CM | POA: Diagnosis present

## 2020-03-23 DIAGNOSIS — R64 Cachexia: Secondary | ICD-10-CM | POA: Diagnosis present

## 2020-03-23 DIAGNOSIS — Z8542 Personal history of malignant neoplasm of other parts of uterus: Secondary | ICD-10-CM | POA: Diagnosis not present

## 2020-03-23 DIAGNOSIS — M810 Age-related osteoporosis without current pathological fracture: Secondary | ICD-10-CM | POA: Diagnosis present

## 2020-03-23 DIAGNOSIS — E43 Unspecified severe protein-calorie malnutrition: Secondary | ICD-10-CM | POA: Diagnosis present

## 2020-03-23 DIAGNOSIS — F419 Anxiety disorder, unspecified: Secondary | ICD-10-CM | POA: Diagnosis present

## 2020-03-23 DIAGNOSIS — K529 Noninfective gastroenteritis and colitis, unspecified: Secondary | ICD-10-CM | POA: Diagnosis present

## 2020-03-23 DIAGNOSIS — Z91048 Other nonmedicinal substance allergy status: Secondary | ICD-10-CM | POA: Diagnosis not present

## 2020-03-23 DIAGNOSIS — E871 Hypo-osmolality and hyponatremia: Secondary | ICD-10-CM | POA: Diagnosis present

## 2020-03-23 DIAGNOSIS — G43909 Migraine, unspecified, not intractable, without status migrainosus: Secondary | ICD-10-CM | POA: Diagnosis present

## 2020-03-23 DIAGNOSIS — Z20822 Contact with and (suspected) exposure to covid-19: Secondary | ICD-10-CM | POA: Diagnosis present

## 2020-03-23 DIAGNOSIS — E785 Hyperlipidemia, unspecified: Secondary | ICD-10-CM | POA: Diagnosis present

## 2020-03-23 DIAGNOSIS — Z90710 Acquired absence of both cervix and uterus: Secondary | ICD-10-CM | POA: Diagnosis not present

## 2020-03-23 DIAGNOSIS — Z85828 Personal history of other malignant neoplasm of skin: Secondary | ICD-10-CM | POA: Diagnosis not present

## 2020-03-23 DIAGNOSIS — Z820 Family history of epilepsy and other diseases of the nervous system: Secondary | ICD-10-CM | POA: Diagnosis not present

## 2020-03-23 LAB — URINALYSIS, ROUTINE W REFLEX MICROSCOPIC
Bilirubin Urine: NEGATIVE
Glucose, UA: NEGATIVE mg/dL
Ketones, ur: NEGATIVE mg/dL
Nitrite: NEGATIVE
Protein, ur: NEGATIVE mg/dL
Specific Gravity, Urine: >1.046 — ABNORMAL HIGH (ref 1.005–1.030)
pH: 7 (ref 5.0–8.0)

## 2020-03-23 LAB — COMPREHENSIVE METABOLIC PANEL
ALT: 14 U/L (ref 0–44)
AST: 18 U/L (ref 15–41)
Albumin: 2.7 g/dL — ABNORMAL LOW (ref 3.5–5.0)
Alkaline Phosphatase: 79 U/L (ref 38–126)
Anion gap: 8 (ref 5–15)
BUN: 13 mg/dL (ref 8–23)
CO2: 22 mmol/L (ref 22–32)
Calcium: 7.8 mg/dL — ABNORMAL LOW (ref 8.9–10.3)
Chloride: 105 mmol/L (ref 98–111)
Creatinine, Ser: 0.81 mg/dL (ref 0.44–1.00)
GFR, Estimated: >60 mL/min (ref >60)
Glucose, Bld: 82 mg/dL (ref 70–99)
Potassium: 3.9 mmol/L (ref 3.5–5.1)
Sodium: 135 mmol/L (ref 135–145)
Total Bilirubin: 0.8 mg/dL (ref 0.3–1.2)
Total Protein: 4.8 g/dL — ABNORMAL LOW (ref 6.5–8.1)

## 2020-03-23 LAB — CBC
HCT: 36.9 % (ref 36.0–46.0)
Hemoglobin: 12.2 g/dL (ref 12.0–15.0)
MCH: 33.1 pg (ref 26.0–34.0)
MCHC: 33.1 g/dL (ref 30.0–36.0)
MCV: 100 fL (ref 80.0–100.0)
Platelets: 226 10*3/uL (ref 150–400)
RBC: 3.69 MIL/uL — ABNORMAL LOW (ref 3.87–5.11)
RDW: 15.1 % (ref 11.5–15.5)
WBC: 14.2 10*3/uL — ABNORMAL HIGH (ref 4.0–10.5)
nRBC: 0 % (ref 0.0–0.2)

## 2020-03-23 LAB — CULTURE, BLOOD (ROUTINE X 2): Special Requests: ADEQUATE

## 2020-03-23 MED ORDER — BISACODYL 10 MG RE SUPP
10.0000 mg | Freq: Once | RECTAL | Status: AC
  Administered 2020-03-23: 10 mg via RECTAL
  Filled 2020-03-23: qty 1

## 2020-03-23 NOTE — ED Notes (Signed)
Report called  

## 2020-03-23 NOTE — Evaluation (Signed)
Physical Therapy Evaluation Patient Details Name: Holly Valenzuela MRN: [id number] DOB: [birth year] Today's Date: 03/23/2020   History of Present Illness  Pt is a 79 y.o. F with significant PMH of HTN, HLD, tobacco use, COPD, recent admissions for syncope and susbequent rehab stay who presents with 1 day of nausea, vomiting, diarrhea. Colitis demonstrated on CT abdomen.  Clinical Impression  Prior to admission, pt lives alone, is independent with mobility and ADL's, but has had assist from daughter with IADL's following recent rehab stay. Pt presents fairly close to her functional baseline. Pt denies pain or nausea. Ambulating 150 feet with no assistive device at a min guard assist level. Demonstrates mild gait abnormalities and balance deficits in addition to decreased cognition. Pt not oriented to time and STM deficits noted. Will benefit from follow up HHPT to address deficits and maximize functional independence.     Follow Up Recommendations Home health PT;Supervision/Assistance - 24 hour    Equipment Recommendations  None recommended by PT    Recommendations for Other Services       Precautions / Restrictions Precautions Precautions: Fall Restrictions Weight Bearing Restrictions: No      Mobility  Bed Mobility Overal bed mobility: Independent                  Transfers Overall transfer level: Needs assistance Equipment used: None Transfers: Sit to/from Stand Sit to Stand: Supervision            Ambulation/Gait Ambulation/Gait assistance: Min guard Gait Distance (Feet): 150 Feet Assistive device: None Gait Pattern/deviations: Step-through pattern;Decreased stride length;Decreased dorsiflexion - right;Decreased dorsiflexion - left Gait velocity: decreased   General Gait Details: Decreased bilateral foot clearance, no gross imbalance, min guard for safety  Stairs            Wheelchair Mobility    Modified Rankin (Stroke Patients Only)        Balance Overall balance assessment: Needs assistance Sitting-balance support: No upper extremity supported;Feet supported Sitting balance-Leahy Scale: Good     Standing balance support: No upper extremity supported;During functional activity Standing balance-Leahy Scale: Fair                               Pertinent Vitals/Pain Pain Assessment: No/denies pain    Home Living Family/patient expects to be discharged to:: Private residence Living Arrangements: Alone Available Help at Discharge: Family Type of Home: Other(Comment) (condo) Home Access: Stairs to enter   [employer] of Steps: 2-3 at front with single rail, 5 at back Home Layout: One level Home Equipment: Shower seat;Grab bars - tub/shower;Hand held Tourist information centre manager - 2 wheels;Cane - single point      Prior Function Level of Independence: Needs assistance   Gait / Transfers Assistance Needed: using cane intermittently  ADL's / Homemaking Assistance Needed: daughter assisting with IADL's        Hand Dominance        Extremity/Trunk Assessment   Upper Extremity Assessment Upper Extremity Assessment: Generalized weakness    Lower Extremity Assessment Lower Extremity Assessment: Generalized weakness    Cervical / Trunk Assessment Cervical / Trunk Assessment: Kyphotic  Communication   Communication: No difficulties  Cognition Arousal/Alertness: Awake/alert Behavior During Therapy: WFL for tasks assessed/performed Overall Cognitive Status: Impaired/Different from baseline Area of Impairment: Orientation;Memory                 Orientation Level: Disoriented to;Time   Memory: Decreased short-term memory  General Comments: Pt incorrectly answering "January," then "September," when asked what month it was. STM deficits noted.      General Comments      Exercises     Assessment/Plan    PT Assessment Patient needs continued PT services  PT Problem List  Decreased strength;Decreased mobility;Decreased safety awareness;Decreased activity tolerance;Decreased balance       PT Treatment Interventions Gait training;Stair training;Functional mobility training;Therapeutic activities;Patient/family education;DME instruction;Therapeutic exercise;Balance training    PT Goals (Current goals can be found in the Care Plan section)  Acute Rehab PT Goals Patient Stated Goal: to return home PT Goal Formulation: With patient Time For Goal Achievement: 04/06/20 Potential to Achieve Goals: Good    Frequency Min 3X/week   Barriers to discharge Decreased caregiver support      Co-evaluation               AM-PAC PT "6 Clicks" Mobility  Outcome Measure Help needed turning from your back to your side while in a flat bed without using bedrails?: None Help needed moving from lying on your back to sitting on the side of a flat bed without using bedrails?: None Help needed moving to and from a bed to a chair (including a wheelchair)?: None Help needed standing up from a chair using your arms (e.g., wheelchair or bedside chair)?: None Help needed to walk in hospital room?: A Little Help needed climbing 3-5 steps with a railing? : A Little 6 Click Score: 22    End of Session Equipment Utilized During Treatment: Gait belt Activity Tolerance: Patient tolerated treatment well Patient left: in bed;with call bell/phone within reach Nurse Communication: Mobility status PT Visit Diagnosis: Difficulty in walking, not elsewhere classified (R26.2);Muscle weakness (generalized) (M62.81)    Time: 1497-0263 PT Time Calculation (min) (ACUTE ONLY): 26 min   Charges:   PT Evaluation $PT Eval Moderate Complexity: 1 Mod PT Treatments $Therapeutic Activity: 8-22 mins        Wyona Almas, PT, DPT Acute Rehabilitation Services Pager [phone] Office [phone]   Deno Etienne 03/23/2020, 3:46 PM

## 2020-03-23 NOTE — Progress Notes (Signed)
Patient admitted to room . Alert and oriented x4 at time. No pain or discomfort. Respiration even and unlabor. Skin dry and warm to touch without and problem noted.

## 2020-03-23 NOTE — Progress Notes (Addendum)
PROGRESS NOTE    Holly Valenzuela  UXL:[id number] DOB: Nov 12, 1940 DOA: 03/22/2020 PCP: Crist Infante, MD   Brief Narrative:   Holly Valenzuela is a 79 y.o. female with medical history significant of hypertension, hyperlipidemia, tobacco use, COPD, GERD, protein calorie malnutrition, recent admissions for syncope with subsequent rehab stay who presented with 1 day of nausea vomiting diarrhea.  10/23: Spoke with patient again today about her diarrhea history given the fact that her CT shows large stool burden. She denies diarrhea. She states she has to take chronic constipation medication. Transitioned to rocephin today. WBC is improved. Will continue IV abx through today. If she continues to improve, look for discharge tomorrow. She will get BM regimen today.   Assessment & Plan: Colitis     - continue rocephin for now; likely transition to augmentin  Constipation     - added bisacodyl spp; PRN MOM  HTN     - BP acceptable, holding home losartan for now  HLD     - continue lipitor   Migrains     - continue fiorecet  GERD     - continue PPI  Anxiety     - continue xanax  Generalized weakness     - PT/OT consult pending  Severe protein calorie malnutrition     - continue dietary supplement  DVT prophylaxis: lovenox Code Status: FULL Family Communication: Attempted called to dtr and son, got VM only.   Status is: Observation  Will change to inpatient d/t ongoing need for IV abx.   Dispo: The patient is from: Home              Anticipated d/c is to: Home              Anticipated d/c date is: 1 day              Patient currently is not medically stable to d/c.  Consultants:   None  Procedures:   None  Antimicrobials:  . Rocephin   ROS:  ROS is negative for all not previously mentioned.  Subjective: "I did not have diarrhea"  Objective: Vitals:   03/23/20 0700 03/23/20 0800 03/23/20 1000 03/23/20 1100  BP: 118/77 (!) 133/98 125/73 128/89  Pulse:  67 72 69 63  Resp: 17 (!) 23 17 18   Temp:      TempSrc:      SpO2: 93% 96% 96% 93%  Weight:      Height:        Intake/Output Summary (Last 24 hours) at 03/23/2020 1431 Last data filed at 03/22/2020 1959 Gross per 24 hour  Intake 50 ml  Output --  Net 50 ml   Filed Weights   03/22/20 1521  Weight: 38.8 kg    Examination:  General: 79 y.o. female resting in bed in NAD Eyes: PERRL, normal sclera ENMT: Nares patent w/o discharge, orophaynx clear, dentition normal, ears w/o discharge/lesions/ulcers Neck: Supple, trachea midline Cardiovascular: RRR, +S1, S2, no m/g/r, equal pulses throughout Respiratory: CTABL, no w/r/r, normal WOB GI: BS+, NDNT, no masses noted, no organomegaly noted MSK: No e/c/c Skin: No rashes, bruises, ulcerations noted Neuro: A&O x 3, no focal deficits Psyc: Appropriate interaction and affect, calm/cooperative   Data Reviewed: I have personally reviewed following labs and imaging studies.  CBC: Recent Labs  Lab 03/22/20 1523 03/23/20 0315  WBC 20.3* 14.2*  HGB 14.1 12.2  HCT 43.4 36.9  MCV 100.9* 100.0  PLT 283 536   Basic Metabolic Panel:  Recent Labs  Lab 03/22/20 1523 03/22/20 1844 03/23/20 0315  NA 133*  --  135  K 4.5  --  3.9  CL 101  --  105  CO2 22  --  22  GLUCOSE 98  --  82  BUN 16  --  13  CREATININE 0.83  --  0.81  CALCIUM 8.4*  --  7.8*  MG  --  2.2  --    GFR: Estimated Creatinine Clearance: 33.9 mL/min (by C-G formula based on SCr of 0.81 mg/dL). Liver Function Tests: Recent Labs  Lab 03/22/20 1523 03/23/20 0315  AST 26 18  ALT 17 14  ALKPHOS 100 79  BILITOT 0.5 0.8  PROT 5.7* 4.8*  ALBUMIN 3.3* 2.7*   Recent Labs  Lab 03/22/20 1523  LIPASE 24   No results for input(s): AMMONIA in the last 168 hours. Coagulation Profile: No results for input(s): INR, PROTIME in the last 168 hours. Cardiac Enzymes: No results for input(s): CKTOTAL, CKMB, CKMBINDEX, TROPONINI in the last 168 hours. BNP (last 3  results) No results for input(s): PROBNP in the last 8760 hours. HbA1C: No results for input(s): HGBA1C in the last 72 hours. CBG: Recent Labs  Lab 03/22/20 1526  GLUCAP 105*   Lipid Profile: No results for input(s): CHOL, HDL, LDLCALC, TRIG, CHOLHDL, LDLDIRECT in the last 72 hours. Thyroid Function Tests: No results for input(s): TSH, T4TOTAL, FREET4, T3FREE, THYROIDAB in the last 72 hours. Anemia Panel: No results for input(s): VITAMINB12, FOLATE, FERRITIN, TIBC, IRON, RETICCTPCT in the last 72 hours. Sepsis Labs: Recent Labs  Lab 03/22/20 1845  LATICACIDVEN 1.1    Recent Results (from the past 240 hour(s))  Blood culture (routine x 2)     Status: None (Preliminary result)   Collection Time: 03/22/20  6:33 PM   Specimen: BLOOD  Result Value Ref Range Status   Specimen Description BLOOD SITE NOT SPECIFIED  Final   Special Requests   Final    BOTTLES DRAWN AEROBIC AND ANAEROBIC Blood Culture adequate volume   Culture   Final    NO GROWTH < 12 HOURS Performed at [facility], [place] [address] [place], [place] [address]    Report Status PENDING  Incomplete  Blood culture (routine x 2)     Status: None (Preliminary result)   Collection Time: 03/22/20  6:35 PM   Specimen: BLOOD  Result Value Ref Range Status   Specimen Description BLOOD SITE NOT SPECIFIED  Final   Special Requests   Final    BOTTLES DRAWN AEROBIC AND ANAEROBIC Blood Culture results may not be optimal due to an inadequate volume of blood received in culture bottles   Culture   Final    NO GROWTH < 12 HOURS Performed at [facility], [place] [address], [place], [place] [address]    Report Status PENDING  Incomplete  Respiratory Panel by RT PCR (Flu A&B, Covid) - Nasopharyngeal Swab     Status: None   Collection Time: 03/22/20  7:08 PM   Specimen: Nasopharyngeal Swab  Result Value Ref Range Status   SARS Coronavirus 2 by RT PCR NEGATIVE NEGATIVE Final    Comment: (NOTE) SARS-CoV-2 target nucleic  acids are NOT DETECTED.  The SARS-CoV-2 RNA is generally detectable in upper respiratoy specimens during the acute phase of infection. The lowest concentration of SARS-CoV-2 viral copies this assay can detect is 131 copies/mL. A negative result does not preclude SARS-Cov-2 infection and should not be used as  the sole basis for treatment or other patient management decisions. A negative result may occur with  improper specimen collection/handling, submission of specimen other than nasopharyngeal swab, presence of viral mutation(s) within the areas targeted by this assay, and inadequate number of viral copies (<131 copies/mL). A negative result must be combined with clinical observations, patient history, and epidemiological information. The expected result is Negative.  Fact Sheet for Patients:  PinkCheek.be  Fact Sheet for Healthcare Providers:  GravelBags.it  This test is no t yet approved or cleared by the [place] FDA and  has been authorized for detection and/or diagnosis of SARS-CoV-2 by FDA under an Emergency Use Authorization (EUA). This EUA will remain  in effect (meaning this test can be used) for the duration of the COVID-19 declaration under Section 564(b)(1) of the Act, 21 U.S.C. section 360bbb-3(b)(1), unless the authorization is terminated or revoked sooner.     Influenza A by PCR NEGATIVE NEGATIVE Final   Influenza B by PCR NEGATIVE NEGATIVE Final    Comment: (NOTE) The Xpert Xpress SARS-CoV-2/FLU/RSV assay is intended as an aid in  the diagnosis of influenza from Nasopharyngeal swab specimens and  should not be used as a sole basis for treatment. Nasal washings and  aspirates are unacceptable for Xpert Xpress SARS-CoV-2/FLU/RSV  testing.  Fact Sheet for Patients: PinkCheek.be  Fact Sheet for Healthcare Providers: GravelBags.it  This test  is not yet approved or cleared by the [place] FDA and  has been authorized for detection and/or diagnosis of SARS-CoV-2 by  FDA under an Emergency Use Authorization (EUA). This EUA will remain  in effect (meaning this test can be used) for the duration of the  Covid-19 declaration under Section 564(b)(1) of the Act, 21  U.S.C. section 360bbb-3(b)(1), unless the authorization is  terminated or revoked. Performed at [facility], [place] [address] [place], [place] [address]       Radiology Studies: CT Head Wo Contrast  Result Date: 03/22/2020 CLINICAL DATA:  Mental status changes EXAM: CT HEAD WITHOUT CONTRAST TECHNIQUE: Contiguous axial images were obtained from the base of the skull through the vertex without intravenous contrast. COMPARISON:  02/28/2020 FINDINGS: Brain: There is atrophy and chronic small vessel disease changes. No acute intracranial abnormality. Specifically, no hemorrhage, hydrocephalus, mass lesion, acute infarction, or significant intracranial injury. Vascular: No hyperdense vessel or unexpected calcification. Skull: No acute calvarial abnormality. Sinuses/Orbits: No acute findings Other: None IMPRESSION: Atrophy, chronic microvascular disease. No acute intracranial abnormality. Electronically Signed   By: Rolm Baptise M.D.   On: 03/22/2020 17:42   CT ABDOMEN PELVIS W CONTRAST  Result Date: 03/22/2020 CLINICAL DATA:  Nausea, vomiting EXAM: CT ABDOMEN AND PELVIS WITH CONTRAST TECHNIQUE: Multidetector CT imaging of the abdomen and pelvis was performed using the standard protocol following bolus administration of intravenous contrast. CONTRAST:  96mL OMNIPAQUE IOHEXOL 300 MG/ML  SOLN COMPARISON:  11/30/2014 FINDINGS: Lower chest: Scarring in the lung bases.  No acute abnormality. Hepatobiliary: Enhancing lesion in the right hepatic dome measures 2.7 cm. Peripheral puddling of contrast compatible with hemangioma. Benign appearing cyst in the lower right hepatic lobe  measures 1.4 cm. Small layering gallstones within the gallbladder. No biliary ductal dilatation. Pancreas: No focal abnormality or ductal dilatation. Spleen: No focal abnormality.  Normal size. Adrenals/Urinary Tract: Small cysts in the lower pole of the right kidney and upper pole of the left kidney. No hydronephrosis. Adrenal glands and urinary bladder unremarkable. Right kidney is low, within the upper pelvis. Stomach/Bowel: Large stool  burden throughout the colon. Stomach and small bowel decompressed, grossly unremarkable. There is mild wall thickening involving the distal transverse colon, splenic flexure and descending colon concerning for colitis. Vascular/Lymphatic: Heavily calcified aorta and iliac vessels. No evidence of aneurysm or adenopathy. Reproductive: No visible mass. Lower pelvis obscured by beam hardening artifact from right hip replacement. Other: No free fluid or free air. Musculoskeletal: Right hip replacement. Prior bilateral sacroplasty. No acute bony abnormality. IMPRESSION: Areas of wall thickening from the distal transverse colon through the descending colon concerning for colitis. Cholelithiasis. Large stool burden throughout the colon. Aortic atherosclerosis. Electronically Signed   By: Rolm Baptise M.D.   On: 03/22/2020 17:51     Scheduled Meds: . atorvastatin  10 mg Oral Daily  . cholecalciferol  1,000 Units Oral Daily  . enoxaparin (LOVENOX) injection  30 mg Subcutaneous Q24H  . feeding supplement  237 mL Oral BID BM  . multivitamin with minerals  1 tablet Oral Daily  . pantoprazole  40 mg Oral Daily  . Teriparatide (Recombinant)  20 mcg Subcutaneous Daily   Continuous Infusions: . sodium chloride    . sodium chloride 125 mL/hr at 03/23/20 0244  . cefTRIAXone (ROCEPHIN)  IV Stopped (03/23/20 1241)     LOS: 0 days    Time spent: 25 minutes spent in the coordination of care today.    Jonnie Finner, DO [facility]  If 7PM-7AM, please contact  night-coverage [email or web address] 03/23/2020, 2:31 PM

## 2020-03-23 NOTE — ED Notes (Signed)
Breakfast ordered 

## 2020-03-24 DIAGNOSIS — K529 Noninfective gastroenteritis and colitis, unspecified: Secondary | ICD-10-CM | POA: Diagnosis not present

## 2020-03-24 DIAGNOSIS — E43 Unspecified severe protein-calorie malnutrition: Secondary | ICD-10-CM | POA: Diagnosis not present

## 2020-03-24 LAB — RENAL FUNCTION PANEL
Albumin: 2.5 g/dL — ABNORMAL LOW (ref 3.5–5.0)
Anion gap: 6 (ref 5–15)
BUN: 9 mg/dL (ref 8–23)
CO2: 23 mmol/L (ref 22–32)
Calcium: 7.7 mg/dL — ABNORMAL LOW (ref 8.9–10.3)
Chloride: 103 mmol/L (ref 98–111)
Creatinine, Ser: 0.59 mg/dL (ref 0.44–1.00)
GFR, Estimated: >60 mL/min (ref >60)
Glucose, Bld: 92 mg/dL (ref 70–99)
Phosphorus: 2.5 mg/dL (ref 2.5–4.6)
Potassium: 3.5 mmol/L (ref 3.5–5.1)
Sodium: 132 mmol/L — ABNORMAL LOW (ref 135–145)

## 2020-03-24 LAB — CBC WITH DIFFERENTIAL/PLATELET
Abs Immature Granulocytes: 0.04 10*3/uL (ref 0.00–0.07)
Basophils Absolute: 0 10*3/uL (ref 0.0–0.1)
Basophils Relative: 0 %
Eosinophils Absolute: 0.2 10*3/uL (ref 0.0–0.5)
Eosinophils Relative: 2 %
HCT: 34.4 % — ABNORMAL LOW (ref 36.0–46.0)
Hemoglobin: 11.6 g/dL — ABNORMAL LOW (ref 12.0–15.0)
Immature Granulocytes: 0 %
Lymphocytes Relative: 22 %
Lymphs Abs: 2.3 10*3/uL (ref 0.7–4.0)
MCH: 33.5 pg (ref 26.0–34.0)
MCHC: 33.7 g/dL (ref 30.0–36.0)
MCV: 99.4 fL (ref 80.0–100.0)
Monocytes Absolute: 0.7 10*3/uL (ref 0.1–1.0)
Monocytes Relative: 6 %
Neutro Abs: 7.1 10*3/uL (ref 1.7–7.7)
Neutrophils Relative %: 70 %
Platelets: 206 10*3/uL (ref 150–400)
RBC: 3.46 MIL/uL — ABNORMAL LOW (ref 3.87–5.11)
RDW: 14.9 % (ref 11.5–15.5)
WBC: 10.3 10*3/uL (ref 4.0–10.5)
nRBC: 0 % (ref 0.0–0.2)

## 2020-03-24 MED ORDER — MAGNESIUM HYDROXIDE 400 MG/5ML PO SUSP: 15.0000 mL | Freq: Every day | ORAL | Status: DC | PRN

## 2020-03-24 MED ORDER — AMOXICILLIN-POT CLAVULANATE 875-125 MG PO TABS
1.0000 | ORAL_TABLET | Freq: Two times a day (BID) | ORAL | Status: DC
  Administered 2020-03-25: 1 via ORAL
  Filled 2020-03-24: qty 1

## 2020-03-24 NOTE — Evaluation (Signed)
Occupational Therapy Evaluation Patient Details Name: Holly Valenzuela MRN: [id number] DOB: 1940-06-05 Today's Date: 03/24/2020    History of Present Illness Pt is a 79 y.o. F with significant PMH of HTN, HLD, tobacco use, COPD, recent admissions for syncope and susbequent rehab stay who presents with 1 day of nausea, vomiting, diarrhea. Colitis demonstrated on CT abdomen.   Clinical Impression   This 79 yo female admitted with above presents to acute OT with PLOF of recently returning home from rehab and doing her own basic ADLs with dtr A'ing with IADLs. Pt currently is setup/S- min A for basic ADLs and associated mobility. She had one LOB with me that I had to help her correct. Pt will continue to benefit from acute OT with follow up [facility] if she has 24/7 A/S at home, if not then SNF is recommended.    Follow Up Recommendations  Home health OT;Supervision/Assistance - 24 hour (if does not have 24/7 care then recommended SNF)    Equipment Recommendations  None recommended by OT       Precautions / Restrictions Precautions Precaution Comments: admitted following fall at home. watch BP Restrictions Weight Bearing Restrictions: No      Mobility Bed Mobility Overal bed mobility: Needs Assistance Bed Mobility: Supine to Sit     Supine to sit: Supervision;HOB elevated          Transfers Overall transfer level: Needs assistance Equipment used: None Transfers: Sit to/from Stand Sit to Stand: Min guard         General transfer comment: Pt had one LOB when ambulating in room without AD for which needed A to correct. This occurred when she turned around at her room door.    Balance Overall balance assessment: Needs assistance Sitting-balance support: No upper extremity supported;Feet supported Sitting balance-Leahy Scale: Good     Standing balance support: No upper extremity supported;During functional activity Standing balance-Leahy Scale: Fair Standing balance  comment: standing at sink for grooming                           ADL either performed or assessed with clinical judgement   ADL Overall ADL's : Needs assistance/impaired Eating/Feeding: Independent;Sitting   Grooming: Min guard;Standing;Wash/dry hands;Wash/dry face;Oral care;Brushing hair   Upper Body Bathing: Set up;Supervision/ safety;Sitting   Lower Body Bathing: Min guard;Sit to/from stand   Upper Body Dressing : Supervision/safety;Set up;Sitting   Lower Body Dressing: Min guard;Sit to/from stand   Toilet Transfer: Minimal assistance;Ambulation;Comfort height toilet;Grab bars Toilet Transfer Details (indicate cue type and reason): No AD Toileting- Clothing Manipulation and Hygiene: Min guard;Sit to/from stand               Vision Patient Visual Report: No change from baseline              Pertinent Vitals/Pain Pain Assessment: Faces Faces Pain Scale: Hurts little more Pain Location: left hip area ("from where I fell before") Pain Intervention(s): Limited activity within patient's tolerance;Monitored during session;Repositioned     Hand Dominance Left   Extremity/Trunk Assessment Upper Extremity Assessment Upper Extremity Assessment: Overall WFL for tasks assessed           Communication Communication Communication: No difficulties   Cognition Arousal/Alertness: Awake/alert Behavior During Therapy: WFL for tasks assessed/performed Overall Cognitive Status: Impaired/Different from baseline Area of Impairment: Safety/judgement                 Orientation Level:  (took increased time  to come up with where she was, but she did get it correct)       Safety/Judgement: Decreased awareness of safety;Decreased awareness of deficits     General Comments: She was aware of year and month this session              [place] expects to be discharged to:: Private residence Living Arrangements: Alone Available Help at  Discharge: Family Type of Home:  (condo) Home Access: Stairs to enter [employer] of Steps: 2-3 at front with single rail, 5 at back   [place]: One level     Bathroom Shower/Tub: Occupational psychologist: [place]: Shower seat;Grab bars - tub/shower;Hand held Tourist information centre manager - 2 wheels;Cane - single point          Prior Functioning/Environment Level of Independence: Needs assistance  Gait / Transfers Assistance Needed: using cane intermittently ADL's / Homemaking Assistance Needed: daughter assisting with IADL's; pt independent with BADLs   Comments: Pt reports she does drive, not sure if she has driven since getting out of rehab        OT Problem List: Impaired balance (sitting and/or standing);Decreased cognition      OT Treatment/Interventions: Self-care/ADL training;DME and/or AE instruction;Patient/family education;Balance training    OT Goals(Current goals can be found in the care plan section) Acute Rehab OT Goals Patient Stated Goal: to go home OT Goal Formulation: With patient Time For Goal Achievement: 04/07/20 Potential to Achieve Goals: Good  OT Frequency: Min 2X/week              AM-PAC OT "6 Clicks" Daily Activity     Outcome Measure Help from another person eating meals?: None Help from another person taking care of personal grooming?: A Little Help from another person toileting, which includes using toliet, bedpan, or urinal?: A Little Help from another person bathing (including washing, rinsing, drying)?: A Little Help from another person to put on and taking off regular upper body clothing?: A Little Help from another person to put on and taking off regular lower body clothing?: A Little 6 Click Score: 19   End of Session Equipment Utilized During Treatment: Gait belt  Activity Tolerance: Patient tolerated treatment well Patient left: in chair;with call bell/phone within reach;with chair  alarm set  OT Visit Diagnosis: Unsteadiness on feet (R26.81);Other abnormalities of gait and mobility (R26.89)                Time: 7544-9201 OT Time Calculation (min): 28 min Charges:  OT General Charges $OT Visit: 1 Visit OT Evaluation $OT Eval Moderate Complexity: 1 Mod OT Treatments $Self Care/Home Management : 8-22 mins  [address], OTR/L Acute [employer] Pager [phone] Office [phone]     Almon Register 03/24/2020, 3:53 PM

## 2020-03-24 NOTE — Plan of Care (Signed)
  Problem: Health Behavior/Discharge Planning: Goal: Ability to manage health-related needs will improve Outcome: Progressing   Problem: Clinical Measurements: Goal: Ability to maintain clinical measurements within normal limits will improve Outcome: Progressing Goal: Will remain free from infection Outcome: Progressing   Problem: Nutrition: Goal: Adequate nutrition will be maintained Outcome: Progressing   Problem: Coping: Goal: Level of anxiety will decrease Outcome: Progressing

## 2020-03-24 NOTE — Progress Notes (Signed)
PROGRESS NOTE    Holly Valenzuela  JJH:[id number] DOB: 1940-06-27 DOA: 03/22/2020 PCP: Crist Infante, MD   Brief Narrative:   Holly Dunning Warffordis a 79 y.o.femalewith medical history significant ofhypertension, hyperlipidemia, tobacco use, COPD, GERD, protein calorie malnutrition, recent admissions for syncope with subsequent rehab stay who presented with 1 day of nausea vomiting diarrhea.  10/24: Spoke with dtr today for patient updates. She states she is arranging her house so that [facility] can come out to help and she would very much be interested in sending her mother home with those services. Pt has yet to have a BM here. CT w/ large stool burden. We will give her an enema. Transition to augment in the morning. If stable, look for discharge tomorrow.   Assessment & Plan:  Colitis     - rocephin through today; transition to augmentin in the AM  Constipation     - bisacodyl spp; add enema     - PRN MOM  HTN     - BP remains acceptable, holding home losartan for now  HLD     - continue lipitor   Migrains     - continue fiorecet  GERD     - continue PPI  Anxiety     - continue xanax  Generalized weakness     - OT consult pending     - PT rec's HHPT; will ask TOC to arrance  Severe protein calorie malnutrition     - continue dietary supplement  DVT prophylaxis: lovenox Code Status: FULL Family Communication: Spoke with dtr by phone   Status is: Inpatient  Remains inpatient appropriate because:Inpatient level of care appropriate due to severity of illness   Dispo: The patient is from: Home              Anticipated d/c is to: Home              Anticipated d/c date is: 1 day              Patient currently is not medically stable to d/c.  Consultants:   None  Procedures:   None  Antimicrobials:  . Rocephin   ROS:  ROS is negative for all not previously mentioned.  Subjective: "I still haven't had one."  Objective: Vitals:   03/23/20  1604 03/23/20 1950 03/24/20 0418 03/24/20 0821  BP: 126/75 124/77 122/65 128/75  Pulse: (!) 58 (!) 56 (!) 54 (!) 55  Resp: 17 18 16 17   Temp: 98 F (36.7 C) 97.9 F (36.6 C) 98.6 F (37 C) (!) 97.5 F (36.4 C)  TempSrc: Oral Oral Oral Oral  SpO2: 98% 97% 94% 99%  Weight:      Height:        Intake/Output Summary (Last 24 hours) at 03/24/2020 1459 Last data filed at 03/24/2020 0900 Gross per 24 hour  Intake 2487.51 ml  Output 475 ml  Net 2012.51 ml   Filed Weights   03/22/20 1521  Weight: 38.8 kg    Examination:  General: 79 y.o. female resting in bed in NAD Eyes: PERRL, normal sclera ENMT: Nares patent w/o discharge, orophaynx clear, dentition normal, ears w/o discharge/lesions/ulcers Neck: Supple, trachea midline Cardiovascular: RRR, +S1, S2, no m/g/r, equal pulses throughout Respiratory: CTABL, no w/r/r, normal WOB GI: BS+, NDNT, no masses noted, no organomegaly noted MSK: No e/c/c Skin: No rashes, bruises, ulcerations noted Neuro: A&O x 3, no focal deficits Psyc: Appropriate interaction and affect, calm/cooperative   Data Reviewed: I have  personally reviewed following labs and imaging studies.  CBC: Recent Labs  Lab 03/22/20 1523 03/23/20 0315 03/24/20 0302  WBC 20.3* 14.2* 10.3  NEUTROABS  --   --  7.1  HGB 14.1 12.2 11.6*  HCT 43.4 36.9 34.4*  MCV 100.9* 100.0 99.4  PLT 283 226 637   Basic Metabolic Panel: Recent Labs  Lab 03/22/20 1523 03/22/20 1844 03/23/20 0315 03/24/20 0302  NA 133*  --  135 132*  K 4.5  --  3.9 3.5  CL 101  --  105 103  CO2 22  --  22 23  GLUCOSE 98  --  82 92  BUN 16  --  13 9  CREATININE 0.83  --  0.81 0.59  CALCIUM 8.4*  --  7.8* 7.7*  MG  --  2.2  --   --   PHOS  --   --   --  2.5   GFR: Estimated Creatinine Clearance: 34.4 mL/min (by C-G formula based on SCr of 0.59 mg/dL). Liver Function Tests: Recent Labs  Lab 03/22/20 1523 03/23/20 0315 03/24/20 0302  AST 26 18  --   ALT 17 14  --   ALKPHOS 100 79   --   BILITOT 0.5 0.8  --   PROT 5.7* 4.8*  --   ALBUMIN 3.3* 2.7* 2.5*   Recent Labs  Lab 03/22/20 1523  LIPASE 24   No results for input(s): AMMONIA in the last 168 hours. Coagulation Profile: No results for input(s): INR, PROTIME in the last 168 hours. Cardiac Enzymes: No results for input(s): CKTOTAL, CKMB, CKMBINDEX, TROPONINI in the last 168 hours. BNP (last 3 results) No results for input(s): PROBNP in the last 8760 hours. HbA1C: No results for input(s): HGBA1C in the last 72 hours. CBG: Recent Labs  Lab 03/22/20 1526  GLUCAP 105*   Lipid Profile: No results for input(s): CHOL, HDL, LDLCALC, TRIG, CHOLHDL, LDLDIRECT in the last 72 hours. Thyroid Function Tests: No results for input(s): TSH, T4TOTAL, FREET4, T3FREE, THYROIDAB in the last 72 hours. Anemia Panel: No results for input(s): VITAMINB12, FOLATE, FERRITIN, TIBC, IRON, RETICCTPCT in the last 72 hours. Sepsis Labs: Recent Labs  Lab 03/22/20 1845  LATICACIDVEN 1.1    Recent Results (from the past 240 hour(s))  Blood culture (routine x 2)     Status: None (Preliminary result)   Collection Time: 03/22/20  6:33 PM   Specimen: BLOOD  Result Value Ref Range Status   Specimen Description BLOOD SITE NOT SPECIFIED  Final   Special Requests   Final    BOTTLES DRAWN AEROBIC AND ANAEROBIC Blood Culture adequate volume   Culture   Final    NO GROWTH 2 DAYS Performed at [facility], [address] [place], [place] [address]    Report Status PENDING  Incomplete  Blood culture (routine x 2)     Status: None (Preliminary result)   Collection Time: 03/22/20  6:35 PM   Specimen: BLOOD  Result Value Ref Range Status   Specimen Description BLOOD SITE NOT SPECIFIED  Final   Special Requests   Final    BOTTLES DRAWN AEROBIC AND ANAEROBIC Blood Culture results may not be optimal due to an inadequate volume of blood received in culture bottles   Culture   Final    NO GROWTH 2 DAYS Performed at [facility], [place] [address] [place], [place] [address]    Report Status PENDING  Incomplete  Respiratory Panel by RT PCR (  Flu A&B, Covid) - Nasopharyngeal Swab     Status: None   Collection Time: 03/22/20  7:08 PM   Specimen: Nasopharyngeal Swab  Result Value Ref Range Status   SARS Coronavirus 2 by RT PCR NEGATIVE NEGATIVE Final    Comment: (NOTE) SARS-CoV-2 target nucleic acids are NOT DETECTED.  The SARS-CoV-2 RNA is generally detectable in upper respiratoy specimens during the acute phase of infection. The lowest concentration of SARS-CoV-2 viral copies this assay can detect is 131 copies/mL. A negative result does not preclude SARS-Cov-2 infection and should not be used as the sole basis for treatment or other patient management decisions. A negative result may occur with  improper specimen collection/handling, submission of specimen other than nasopharyngeal swab, presence of viral mutation(s) within the areas targeted by this assay, and inadequate number of viral copies (<131 copies/mL). A negative result must be combined with clinical observations, patient history, and epidemiological information. The expected result is Negative.  Fact Sheet for Patients:  PinkCheek.be  Fact Sheet for Healthcare Providers:  GravelBags.it  This test is no t yet approved or cleared by the [place] FDA and  has been authorized for detection and/or diagnosis of SARS-CoV-2 by FDA under an Emergency Use Authorization (EUA). This EUA will remain  in effect (meaning this test can be used) for the duration of the COVID-19 declaration under Section 564(b)(1) of the Act, 21 U.S.C. section 360bbb-3(b)(1), unless the authorization is terminated or revoked sooner.     Influenza A by PCR NEGATIVE NEGATIVE Final   Influenza B by PCR NEGATIVE NEGATIVE Final    Comment: (NOTE) The Xpert Xpress SARS-CoV-2/FLU/RSV assay is intended as an aid in    the diagnosis of influenza from Nasopharyngeal swab specimens and  should not be used as a sole basis for treatment. Nasal washings and  aspirates are unacceptable for Xpert Xpress SARS-CoV-2/FLU/RSV  testing.  Fact Sheet for Patients: PinkCheek.be  Fact Sheet for Healthcare Providers: GravelBags.it  This test is not yet approved or cleared by the [place] FDA and  has been authorized for detection and/or diagnosis of SARS-CoV-2 by  FDA under an Emergency Use Authorization (EUA). This EUA will remain  in effect (meaning this test can be used) for the duration of the  Covid-19 declaration under Section 564(b)(1) of the Act, 21  U.S.C. section 360bbb-3(b)(1), unless the authorization is  terminated or revoked. Performed at [facility], [place] [address] [place], [place] [address]       Radiology Studies: CT Head Wo Contrast  Result Date: 03/22/2020 CLINICAL DATA:  Mental status changes EXAM: CT HEAD WITHOUT CONTRAST TECHNIQUE: Contiguous axial images were obtained from the base of the skull through the vertex without intravenous contrast. COMPARISON:  02/28/2020 FINDINGS: Brain: There is atrophy and chronic small vessel disease changes. No acute intracranial abnormality. Specifically, no hemorrhage, hydrocephalus, mass lesion, acute infarction, or significant intracranial injury. Vascular: No hyperdense vessel or unexpected calcification. Skull: No acute calvarial abnormality. Sinuses/Orbits: No acute findings Other: None IMPRESSION: Atrophy, chronic microvascular disease. No acute intracranial abnormality. Electronically Signed   By: Rolm Baptise M.D.   On: 03/22/2020 17:42   CT ABDOMEN PELVIS W CONTRAST  Result Date: 03/22/2020 CLINICAL DATA:  Nausea, vomiting EXAM: CT ABDOMEN AND PELVIS WITH CONTRAST TECHNIQUE: Multidetector CT imaging of the abdomen and pelvis was performed using the standard protocol following  bolus administration of intravenous contrast. CONTRAST:  33mL OMNIPAQUE IOHEXOL 300 MG/ML  SOLN COMPARISON:  11/30/2014 FINDINGS: Lower chest: Scarring  in the lung bases.  No acute abnormality. Hepatobiliary: Enhancing lesion in the right hepatic dome measures 2.7 cm. Peripheral puddling of contrast compatible with hemangioma. Benign appearing cyst in the lower right hepatic lobe measures 1.4 cm. Small layering gallstones within the gallbladder. No biliary ductal dilatation. Pancreas: No focal abnormality or ductal dilatation. Spleen: No focal abnormality.  Normal size. Adrenals/Urinary Tract: Small cysts in the lower pole of the right kidney and upper pole of the left kidney. No hydronephrosis. Adrenal glands and urinary bladder unremarkable. Right kidney is low, within the upper pelvis. Stomach/Bowel: Large stool burden throughout the colon. Stomach and small bowel decompressed, grossly unremarkable. There is mild wall thickening involving the distal transverse colon, splenic flexure and descending colon concerning for colitis. Vascular/Lymphatic: Heavily calcified aorta and iliac vessels. No evidence of aneurysm or adenopathy. Reproductive: No visible mass. Lower pelvis obscured by beam hardening artifact from right hip replacement. Other: No free fluid or free air. Musculoskeletal: Right hip replacement. Prior bilateral sacroplasty. No acute bony abnormality. IMPRESSION: Areas of wall thickening from the distal transverse colon through the descending colon concerning for colitis. Cholelithiasis. Large stool burden throughout the colon. Aortic atherosclerosis. Electronically Signed   By: Rolm Baptise M.D.   On: 03/22/2020 17:51     Scheduled Meds: . atorvastatin  10 mg Oral Daily  . cholecalciferol  1,000 Units Oral Daily  . enoxaparin (LOVENOX) injection  30 mg Subcutaneous Q24H  . feeding supplement  237 mL Oral BID BM  . multivitamin with minerals  1 tablet Oral Daily  . pantoprazole  40 mg Oral  Daily   Continuous Infusions: . sodium chloride Stopped (03/24/20 1156)  . cefTRIAXone (ROCEPHIN)  IV 2 g (03/24/20 0903)     LOS: 1 day    Time spent: 25 minutes spent in the coordination of care today.    Jonnie Finner, DO [facility]  If 7PM-7AM, please contact night-coverage [email or web address] 03/24/2020, 2:59 PM

## 2020-03-24 NOTE — Plan of Care (Signed)
  Problem: Education: Goal: Knowledge of General Education information will improve Description: Including pain rating scale, medication(s)/side effects and non-pharmacologic comfort measures Outcome: Progressing   Problem: Health Behavior/Discharge Planning: Goal: Ability to manage health-related needs will improve Outcome: Progressing   Problem: Clinical Measurements: Goal: Will remain free from infection Outcome: Progressing   

## 2020-03-25 DIAGNOSIS — I10 Essential (primary) hypertension: Secondary | ICD-10-CM

## 2020-03-25 DIAGNOSIS — E43 Unspecified severe protein-calorie malnutrition: Secondary | ICD-10-CM | POA: Diagnosis not present

## 2020-03-25 DIAGNOSIS — K529 Noninfective gastroenteritis and colitis, unspecified: Secondary | ICD-10-CM | POA: Diagnosis not present

## 2020-03-25 LAB — CBC WITH DIFFERENTIAL/PLATELET
Abs Immature Granulocytes: 0.03 10*3/uL (ref 0.00–0.07)
Basophils Absolute: 0 10*3/uL (ref 0.0–0.1)
Basophils Relative: 0 %
Eosinophils Absolute: 0.1 10*3/uL (ref 0.0–0.5)
Eosinophils Relative: 1 %
HCT: 44.4 % (ref 36.0–46.0)
Hemoglobin: 14.8 g/dL (ref 12.0–15.0)
Immature Granulocytes: 0 %
Lymphocytes Relative: 22 %
Lymphs Abs: 1.7 10*3/uL (ref 0.7–4.0)
MCH: 32.5 pg (ref 26.0–34.0)
MCHC: 33.3 g/dL (ref 30.0–36.0)
MCV: 97.6 fL (ref 80.0–100.0)
Monocytes Absolute: 0.5 10*3/uL (ref 0.1–1.0)
Monocytes Relative: 7 %
Neutro Abs: 5.4 10*3/uL (ref 1.7–7.7)
Neutrophils Relative %: 70 %
Platelets: 237 10*3/uL (ref 150–400)
RBC: 4.55 MIL/uL (ref 3.87–5.11)
RDW: 14.5 % (ref 11.5–15.5)
WBC: 7.8 10*3/uL (ref 4.0–10.5)
nRBC: 0 % (ref 0.0–0.2)

## 2020-03-25 LAB — RENAL FUNCTION PANEL
Albumin: 3.7 g/dL (ref 3.5–5.0)
Anion gap: 12 (ref 5–15)
BUN: 5 mg/dL — ABNORMAL LOW (ref 8–23)
CO2: 25 mmol/L (ref 22–32)
Calcium: 9.4 mg/dL (ref 8.9–10.3)
Chloride: 96 mmol/L — ABNORMAL LOW (ref 98–111)
Creatinine, Ser: 0.72 mg/dL (ref 0.44–1.00)
GFR, Estimated: >60 mL/min (ref >60)
Glucose, Bld: 98 mg/dL (ref 70–99)
Phosphorus: 2.8 mg/dL (ref 2.5–4.6)
Potassium: 3.2 mmol/L — ABNORMAL LOW (ref 3.5–5.1)
Sodium: 133 mmol/L — ABNORMAL LOW (ref 135–145)

## 2020-03-25 MED ORDER — MECLIZINE HCL 12.5 MG PO TABS: 12.5000 mg | ORAL_TABLET | Freq: Four times a day (QID) | ORAL | Status: DC | PRN

## 2020-03-25 MED ORDER — ENSURE COMPLETE PO LIQD
237.0000 mL | Freq: Every day | ORAL | Status: DC | PRN
Start: 2020-03-25 — End: 2021-03-28

## 2020-03-25 MED ORDER — DULCOLAX 5 MG PO TBEC: 5.0000 mg | DELAYED_RELEASE_TABLET | Freq: Every day | ORAL | 0 refills | Status: AC | PRN

## 2020-03-25 MED ORDER — AMOXICILLIN-POT CLAVULANATE 875-125 MG PO TABS: 1.0000 | ORAL_TABLET | Freq: Two times a day (BID) | ORAL | 0 refills | Status: AC

## 2020-03-25 NOTE — TOC Transition Note (Signed)
Transition of Care [facility]) - CM/SW Discharge Note   Patient Details  Name: Holly Valenzuela MRN: [id number] Date of Birth: 07-23-1940  Transition of [facility] CM/SW Contact:  Sharin Mons, RN Phone Number: 03/25/2020, 12:50 PM   Clinical Narrative:     Patient will DC to: home Anticipated DC date: 03/25/2020 Family notified: daughter, Holly Valenzuela Transport by: care  Readmitted with Colitis. Previous admit, syncope. From home alone. Per MD patient ready for DC today. Pt states once d/c daughter Holly Valenzuela and a friend will provide the supervision and assist needed. Pt agreeable to home health services and prior to admit active with [facility]. Pt stated ok with continued usage of them. NCM made liaison with [facility] aware. DME: rolling walker will be delivered to pt's bedside prior to d/c.  Pt without Rx med concerns / affordability.  Post hospital f/u noted on AVS.  RNCM will sign off for now as intervention is no longer needed. Please consult [place] again if new needs arise.  Holly Valenzuela (Daughter)  (Son)    [phone]        Final next level of care: Home w Home Health Services Barriers to Discharge: No Barriers Identified   Patient Goals and CMS Choice Patient states their goals for this hospitalization and ongoing recovery are:: go home and get better   Choice offered to / list presented to : Patient  Discharge Placement                       Discharge Plan and Services   Discharge Planning Services: CM Consult Post Acute Care Choice: Durable Medical Equipment, Home Health          DME Arranged: Walker rolling DME Agency: [employer] Date DME Agency Contacted: 03/25/20 Time DME Agency Contacted: 1215 Representative spoke with at DME Agency: Freda Munro HH Arranged: PT, OT [place] Agency:  (Active with [place]) Date [place]: 03/25/20 Time [place]: 1216 Representative spoke with at [place]:  [place] ([place]) Interventions     Readmission Risk Interventions Readmission Risk Prevention Plan 03/01/2020  Post Dischage Appt Not Complete  Appt Comments rec for SNF  Medication Screening Complete  Transportation Screening Complete  Some recent data might be hidden

## 2020-03-25 NOTE — Discharge Summary (Signed)
Physician Discharge Summary  Holly Valenzuela TGG:[id number] DOB: 28-Oct-1940 DOA: 03/22/2020  PCP: Holly Infante, MD  Admit date: 03/22/2020 Discharge date: 03/25/2020  Admitted From: Home Disposition:  Discharged to home  Recommendations for Outpatient Follow-up:  1. Follow up with PCP in 1 weeks  Home Health: HHPT/OT  Equipment/Devices: None   Discharge Condition: Stable  CODE STATUS: FULL   Brief/Interim Summary: Holly Dunning Warffordis a 79 y.o.femalewith medical history significant ofhypertension, hyperlipidemia, tobacco use, COPD, GERD, protein calorie malnutrition, recent admissions for syncope with subsequent rehab stay who presented with 1 day of nausea vomiting diarrhea.  Had long discussion with patient this morning about going home and living alone. She reports that she is hurt that "my oriental rugs are getting thrown away". She says, "You can not keep me wrapped up in a bubble. I could get hurt no matter where I am, but you have to let me live." She says, "I know they want to protect me, but you just have to let me live. If I die, it's gonna happen." She is aware of the recommendations made by PT/OT. She is able to tell me name, place and year. She seems frustrated this AM. Spoke with daughter to update her. She states that her mom is functional at home. She pays her bills on time. She keeps her fridge stocked. Her daughter is arranging for a sitter to come out to the house and meals on wheels as well. She feels that the patient will be ok with discharge to home. She is stable and ready for discharge. She will be discharged to home with HHPT/OT and 5 days augmentin.  Discharge Diagnoses: Colitis - CT with colitis and large stool burden     - started on zosyn in ED. Transitioned to rocephin and then augmentin. Will continue augmentin for 5 days.     - Denies diarrhea, N, V.  Constipation - bisacodyl spp; added enema     - PRN MOM     - had two BMs last  night     - has seen [facility] in the past (Dr. Darden Dates), rec continuing follow up with them for chronic constipation  HTN - BP is moving up. Can resume home losartan at discharge  HLD - continue lipitor   Migrains - continue fiorecet  GERD - continue PPI  Anxiety - continue xanax  Generalized weakness     - PT/OT rec HHPT/OT; have asked TOC to arrance  Severe protein calorie malnutrition - continue dietary supplement   Discharge Instructions   Allergies as of 03/25/2020      Reactions   Adhesive [tape] Itching      Medication List    STOP taking these medications   Linzess 145 MCG Caps capsule Generic drug: linaclotide     TAKE these medications   ALPRAZolam 0.5 MG tablet Commonly known as: XANAX Take 0.5-1 tablets (0.25-0.5 mg total) by mouth 3 (three) times daily as needed for anxiety. What changed:   when to take this  additional instructions  Another medication with the same name was removed. Continue taking this medication, and follow the directions you see here.   amoxicillin-clavulanate 875-125 MG tablet Commonly known as: AUGMENTIN Take 1 tablet by mouth every 12 (twelve) hours for 9 doses.   ascorbic acid 500 MG tablet Commonly known as: VITAMIN C Take 500-1,000 mg by mouth daily.   atorvastatin 10 MG tablet Commonly known as: LIPITOR Take 10 mg by mouth daily.   butalbital-acetaminophen-caffeine 50-325-40 MG  tablet Commonly known as: FIORICET Take 0.5-1 tablets by mouth 4 (four) times daily as needed for migraine.   cyanocobalamin 1000 MCG/ML injection Commonly known as: (VITAMIN B-12) Inject 1,000 mcg into the muscle every 30 (thirty) days.   diclofenac 75 MG EC tablet Commonly known as: VOLTAREN TAKE 1 TABLET BY MOUTH TWICE A DAY WITH MEALS FOR INFLAMMATION/SWELLING What changed: See the new instructions.   Dulcolax 5 MG EC tablet Generic drug: bisacodyl Take 1 tablet (5 mg total) by mouth daily as  needed for moderate constipation.   EYE-VITE PLUS LUTEIN PO Take 1 capsule by mouth 3 (three) times a week.   famotidine 20 MG tablet Commonly known as: PEPCID Take 20 mg by mouth 2 (two) times daily as needed for heartburn or indigestion.   feeding supplement (ENSURE COMPLETE) Liqd Take 237 mLs by mouth daily as needed (for supplementation).   fluticasone 50 MCG/ACT nasal spray Commonly known as: FLONASE Place 2 sprays into both nostrils daily as needed for allergies or rhinitis.   Forteo 600 MCG/2.4ML Soln Generic drug: Teriparatide (Recombinant) Inject 20 mcg into the skin every 6 (six) months.   HAIR SKIN & NAILS GUMMIES PO Take 1 tablet by mouth See admin instructions. Chew 1 gummie by mouth daily   losartan 50 MG tablet Commonly known as: COZAAR Take 50 mg by mouth daily.   meclizine 12.5 MG tablet Commonly known as: ANTIVERT Take 1-2 tablets (12.5-25 mg total) by mouth every 6 (six) hours as needed for dizziness.   mirtazapine 15 MG tablet Commonly known as: REMERON Take 7.5 mg by mouth at bedtime as needed ("for sleep").   multivitamin with minerals Tabs tablet Take 1 tablet by mouth daily.   pantoprazole 40 MG tablet Commonly known as: PROTONIX Take 40 mg by mouth daily before breakfast.   TURMERIC PO Take 1 capsule by mouth at bedtime.   Vitamin D-3 25 MCG (1000 UT) Caps Take 1,000 Units by mouth daily.            Durable Medical Equipment  (From admission, onward)         Start     Ordered   03/25/20 1227  For home use only DME Walker rolling  Once       Question Answer Comment  Walker: With 5 Inch Wheels   Patient needs a walker to treat with the following condition Weakness      03/25/20 1228          Allergies  Allergen Reactions  . Adhesive [Tape] Itching    Consultations:  None   Procedures/Studies: CT Head Wo Contrast  Result Date: 03/22/2020 CLINICAL DATA:  Mental status changes EXAM: CT HEAD WITHOUT CONTRAST  TECHNIQUE: Contiguous axial images were obtained from the base of the skull through the vertex without intravenous contrast. COMPARISON:  02/28/2020 FINDINGS: Brain: There is atrophy and chronic small vessel disease changes. No acute intracranial abnormality. Specifically, no hemorrhage, hydrocephalus, mass lesion, acute infarction, or significant intracranial injury. Vascular: No hyperdense vessel or unexpected calcification. Skull: No acute calvarial abnormality. Sinuses/Orbits: No acute findings Other: None IMPRESSION: Atrophy, chronic microvascular disease. No acute intracranial abnormality. Electronically Signed   By: Rolm Baptise M.D.   On: 03/22/2020 17:42   CT Head Wo Contrast  Result Date: 02/28/2020 CLINICAL DATA:  Fall EXAM: CT HEAD WITHOUT CONTRAST CT CERVICAL SPINE WITHOUT CONTRAST TECHNIQUE: Multidetector CT imaging of the head and cervical spine was performed following the standard protocol without intravenous contrast. Multiplanar CT image  reconstructions of the cervical spine were also generated. COMPARISON:  None. FINDINGS: CT HEAD FINDINGS Brain: There is no mass, hemorrhage or extra-axial collection. There is generalized atrophy without lobar predilection. There is hypoattenuation of the periventricular white matter, most commonly indicating chronic ischemic microangiopathy. Vascular: No abnormal hyperdensity of the major intracranial arteries or dural venous sinuses. No intracranial atherosclerosis. Skull: The visualized skull base, calvarium and extracranial soft tissues are normal. Sinuses/Orbits: No fluid levels or advanced mucosal thickening of the visualized paranasal sinuses. No mastoid or middle ear effusion. The orbits are normal. CT CERVICAL SPINE FINDINGS Alignment: Reversal of normal cervical lordosis centered at C5. Grade 1 anterolisthesis at C4-5. Grade 1 retrolisthesis at C5-6. Skull base and vertebrae: No acute fracture. Soft tissues and spinal canal: No prevertebral fluid or  swelling. No visible canal hematoma. Disc levels: No advanced spinal canal or neural foraminal stenosis. Upper chest: No pneumothorax, pulmonary nodule or pleural effusion. Other: Normal visualized paraspinal cervical soft tissues. IMPRESSION: 1. Chronic ischemic microangiopathy and generalized volume loss without acute intracranial abnormality. 2. No acute fracture or static subluxation of the cervical spine. Electronically Signed   By: Ulyses Jarred M.D.   On: 02/28/2020 20:51   CT Cervical Spine Wo Contrast  Result Date: 02/28/2020 CLINICAL DATA:  Fall EXAM: CT HEAD WITHOUT CONTRAST CT CERVICAL SPINE WITHOUT CONTRAST TECHNIQUE: Multidetector CT imaging of the head and cervical spine was performed following the standard protocol without intravenous contrast. Multiplanar CT image reconstructions of the cervical spine were also generated. COMPARISON:  None. FINDINGS: CT HEAD FINDINGS Brain: There is no mass, hemorrhage or extra-axial collection. There is generalized atrophy without lobar predilection. There is hypoattenuation of the periventricular white matter, most commonly indicating chronic ischemic microangiopathy. Vascular: No abnormal hyperdensity of the major intracranial arteries or dural venous sinuses. No intracranial atherosclerosis. Skull: The visualized skull base, calvarium and extracranial soft tissues are normal. Sinuses/Orbits: No fluid levels or advanced mucosal thickening of the visualized paranasal sinuses. No mastoid or middle ear effusion. The orbits are normal. CT CERVICAL SPINE FINDINGS Alignment: Reversal of normal cervical lordosis centered at C5. Grade 1 anterolisthesis at C4-5. Grade 1 retrolisthesis at C5-6. Skull base and vertebrae: No acute fracture. Soft tissues and spinal canal: No prevertebral fluid or swelling. No visible canal hematoma. Disc levels: No advanced spinal canal or neural foraminal stenosis. Upper chest: No pneumothorax, pulmonary nodule or pleural effusion.  Other: Normal visualized paraspinal cervical soft tissues. IMPRESSION: 1. Chronic ischemic microangiopathy and generalized volume loss without acute intracranial abnormality. 2. No acute fracture or static subluxation of the cervical spine. Electronically Signed   By: Ulyses Jarred M.D.   On: 02/28/2020 20:51   MR BRAIN WO CONTRAST  Result Date: 03/01/2020 CLINICAL DATA:  79 year old female with dizziness. Episode of loss of consciousness. EXAM: MRI HEAD WITHOUT CONTRAST TECHNIQUE: Multiplanar, multiecho pulse sequences of the brain and surrounding structures were obtained without intravenous contrast. COMPARISON:  Head CT 02/28/2020. FINDINGS: Brain: Stable cerebral volume. No restricted diffusion to suggest acute infarction. No midline shift, mass effect, evidence of mass lesion, ventriculomegaly, extra-axial collection or acute intracranial hemorrhage. Cervicomedullary junction and pituitary are within normal limits. Widespread, confluent bilateral cerebral white matter T2 and FLAIR hyperintensity, with similar signal changes in the pons. Comparatively mild T2 heterogeneity in the bilateral deep gray nuclei, relatively sparing the right thalamus. No cortical encephalomalacia identified. There is a solitary chronic microhemorrhage in the left parietal lobe (series 8, image 18). Negative cerebellum. Vascular: Major intracranial vascular flow voids  are preserved. Fetal type right PCA origin suspected (normal variant). Skull and upper cervical spine: Mild for age visible cervical spine degeneration, capacious spinal canal. Visualized bone marrow signal is within normal limits. Sinuses/Orbits: Negative. Other: Mastoids are clear. Visible internal auditory structures appear normal. Scalp and face appear negative. IMPRESSION: 1. No acute intracranial abnormality. 2. 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 Electronically Signed   By: Genevie Ann M.D.   On: 03/01/2020 14:41    CT ABDOMEN PELVIS W CONTRAST  Result Date: 03/22/2020 CLINICAL DATA:  Nausea, vomiting EXAM: CT ABDOMEN AND PELVIS WITH CONTRAST TECHNIQUE: Multidetector CT imaging of the abdomen and pelvis was performed using the standard protocol following bolus administration of intravenous contrast. CONTRAST:  21mL OMNIPAQUE IOHEXOL 300 MG/ML  SOLN COMPARISON:  11/30/2014 FINDINGS: Lower chest: Scarring in the lung bases.  No acute abnormality. Hepatobiliary: Enhancing lesion in the right hepatic dome measures 2.7 cm. Peripheral puddling of contrast compatible with hemangioma. Benign appearing cyst in the lower right hepatic lobe measures 1.4 cm. Small layering gallstones within the gallbladder. No biliary ductal dilatation. Pancreas: No focal abnormality or ductal dilatation. Spleen: No focal abnormality.  Normal size. Adrenals/Urinary Tract: Small cysts in the lower pole of the right kidney and upper pole of the left kidney. No hydronephrosis. Adrenal glands and urinary bladder unremarkable. Right kidney is low, within the upper pelvis. Stomach/Bowel: Large stool burden throughout the colon. Stomach and small bowel decompressed, grossly unremarkable. There is mild wall thickening involving the distal transverse colon, splenic flexure and descending colon concerning for colitis. Vascular/Lymphatic: Heavily calcified aorta and iliac vessels. No evidence of aneurysm or adenopathy. Reproductive: No visible mass. Lower pelvis obscured by beam hardening artifact from right hip replacement. Other: No free fluid or free air. Musculoskeletal: Right hip replacement. Prior bilateral sacroplasty. No acute bony abnormality. IMPRESSION: Areas of wall thickening from the distal transverse colon through the descending colon concerning for colitis. Cholelithiasis. Large stool burden throughout the colon. Aortic atherosclerosis. Electronically Signed   By: Rolm Baptise M.D.   On: 03/22/2020 17:51   DG Chest Port 1 View  Result Date:  02/28/2020 CLINICAL DATA:  Fall.  Syncope. EXAM: PORTABLE CHEST 1 VIEW COMPARISON:  04/17/2014 chest radiograph, 03/15/2018 chest CT FINDINGS: The lungs are hyperinflated with diffuse interstitial prominence. No focal airspace consolidation or pulmonary edema. No pleural effusion or pneumothorax. Normal cardiomediastinal contours. IMPRESSION: COPD without acute airspace disease. Electronically Signed   By: Ulyses Jarred M.D.   On: 02/28/2020 20:35   ECHOCARDIOGRAM COMPLETE  Result Date: 02/29/2020    ECHOCARDIOGRAM REPORT   Patient Name:   NEESHA LANGTON [facility] Date of Exam: 02/29/2020 Medical Rec #:  [id number]               Height:       65.0 in Accession #:    [id number]              Weight:       85.5 lb Date of Birth:  [birth year]               BSA:          1.379 m Patient Age:    75 years                BP:           136/87 mmHg Patient Gender: F  HR:           72 bpm. Exam Location:  Inpatient Procedure: 2D Echo Indications:    syncope 780.2  History:        Patient has no prior history of Echocardiogram examinations.                 COPD, Signs/Symptoms:Syncope; Risk Factors:Current Smoker and                 Hypertension.  Sonographer:    Johny Chess Referring Phys: [place]  1. Left ventricular ejection fraction, by estimation, is 60 to 65%. The left ventricle has normal function. The left ventricle has no regional wall motion abnormalities. Left ventricular diastolic parameters are consistent with Grade I diastolic dysfunction (impaired relaxation).  2. Right ventricular systolic function is normal. The right ventricular size is normal.  3. The mitral valve is normal in structure. Trivial mitral valve regurgitation.  4. The aortic valve is tricuspid. There is mild calcification of the aortic valve. There is mild thickening of the aortic valve. Aortic valve regurgitation is not visualized.  5. The inferior vena cava is normal in size with <50%  respiratory variability, suggesting right atrial pressure of 8 mmHg. Comparison(s): No prior Echocardiogram. FINDINGS  Left Ventricle: Left ventricular ejection fraction, by estimation, is 60 to 65%. The left ventricle has normal function. The left ventricle has no regional wall motion abnormalities. The left ventricular internal cavity size was normal in size. There is  no left ventricular hypertrophy. Left ventricular diastolic parameters are consistent with Grade I diastolic dysfunction (impaired relaxation). Right Ventricle: The right ventricular size is normal. Right vetricular wall thickness was not assessed. Right ventricular systolic function is normal. Left Atrium: Left atrial size was normal in size. Right Atrium: Right atrial size was normal in size. Pericardium: There is no evidence of pericardial effusion. Mitral Valve: The mitral valve is normal in structure. There is mild thickening of the mitral valve leaflet(s). There is mild calcification of the mitral valve leaflet(s). Mild mitral annular calcification. Trivial mitral valve regurgitation. Tricuspid Valve: The tricuspid valve is normal in structure. Tricuspid valve regurgitation is trivial. Aortic Valve: The aortic valve is tricuspid. There is mild calcification of the aortic valve. There is mild thickening of the aortic valve. Aortic valve regurgitation is not visualized. Pulmonic Valve: The pulmonic valve was normal in structure. Pulmonic valve regurgitation is mild. Aorta: The aortic root and ascending aorta are structurally normal, with no evidence of dilitation. Venous: The inferior vena cava is normal in size with less than 50% respiratory variability, suggesting right atrial pressure of 8 mmHg. IAS/Shunts: No atrial level shunt detected by color flow Doppler.  LEFT VENTRICLE PLAX 2D LVIDd:         3.60 cm  Diastology LVIDs:         2.70 cm  LV e' medial:    6.53 cm/s LV PW:         0.80 cm  LV E/e' medial:  10.0 LV IVS:        0.80 cm  LV e'  lateral:   7.94 cm/s LVOT diam:     1.80 cm  LV E/e' lateral: 8.3 LV SV:         47 LV SV Index:   34 LVOT Area:     2.54 cm  RIGHT VENTRICLE             IVC RV S prime:  13.60 cm/s  IVC diam: 1.70 cm TAPSE (M-mode): 2.2 cm LEFT ATRIUM             Index       RIGHT ATRIUM          Index LA diam:        2.90 cm 2.10 cm/m  RA Area:     7.60 cm LA Vol (A2C):   22.6 ml 16.39 ml/m RA Volume:   12.00 ml 8.70 ml/m LA Vol (A4C):   24.4 ml 17.70 ml/m LA Biplane Vol: 25.0 ml 18.13 ml/m  AORTIC VALVE LVOT Vmax:   86.20 cm/s LVOT Vmean:  56.000 cm/s LVOT VTI:    0.184 m  AORTA Ao Root diam: 2.80 cm Ao Asc diam:  2.60 cm MITRAL VALVE MV Area (PHT): 2.56 cm    SHUNTS MV Decel Time: 296 msec    Systemic VTI:  0.18 m MV E velocity: 65.60 cm/s  Systemic Diam: 1.80 cm MV A velocity: 92.50 cm/s MV E/A ratio:  0.71 Gwyndolyn Kaufman MD Electronically signed by Gwyndolyn Kaufman MD Signature Date/Time: 02/29/2020/3:15:05 PM    Final       Subjective: "I just want to live my life."  Discharge Exam: Vitals:   03/25/20 0417 03/25/20 0811  BP: (!) 133/92 132/84  Pulse: (!) 58 70  Resp: 16 16  Temp: 98.2 F (36.8 C) 98.4 F (36.9 C)  SpO2: 94% 96%   Vitals:   03/24/20 0821 03/24/20 2055 03/25/20 0417 03/25/20 0811  BP: 128/75 127/82 (!) 133/92 132/84  Pulse: (!) 55 (!) 52 (!) 58 70  Resp: 17 16 16 16   Temp: (!) 97.5 F (36.4 C) 98.1 F (36.7 C) 98.2 F (36.8 C) 98.4 F (36.9 C)  TempSrc: Oral Oral Oral Oral  SpO2: 99% 100% 94% 96%  Weight:      Height:        General: 79 y.o. female resting in bed in NAD Eyes: PERRL, normal sclera ENMT: Nares patent w/o discharge, orophaynx clear, dentition normal, ears w/o discharge/lesions/ulcers Neck: Supple, trachea midline Cardiovascular: RRR, +S1, S2, no m/g/r, equal pulses throughout Respiratory: CTABL, no w/r/r, normal WOB GI: BS+, NDNT, no masses noted, no organomegaly noted MSK: No e/c/c Skin: No rashes, bruises, ulcerations noted Neuro: A&O x  3, no focal deficits Psyc: Appropriate interaction and affect, calm/cooperative   The results of significant diagnostics from this hospitalization (including imaging, microbiology, ancillary and laboratory) are listed below for reference.     Microbiology: Recent Results (from the past 240 hour(s))  Blood culture (routine x 2)     Status: None (Preliminary result)   Collection Time: 03/22/20  6:33 PM   Specimen: BLOOD  Result Value Ref Range Status   Specimen Description BLOOD SITE NOT SPECIFIED  Final   Special Requests   Final    BOTTLES DRAWN AEROBIC AND ANAEROBIC Blood Culture adequate volume   Culture   Final    NO GROWTH 3 DAYS Performed at [facility], [address], [place], [place] [address]    Report Status PENDING  Incomplete  Blood culture (routine x 2)     Status: None (Preliminary result)   Collection Time: 03/22/20  6:35 PM   Specimen: BLOOD  Result Value Ref Range Status   Specimen Description BLOOD SITE NOT SPECIFIED  Final   Special Requests   Final    BOTTLES DRAWN AEROBIC AND ANAEROBIC Blood Culture results may not be optimal due to an inadequate volume  of blood received in culture bottles   Culture   Final    NO GROWTH 3 DAYS Performed at [facility], [place] [address] [place], [place] [address]    Report Status PENDING  Incomplete  Respiratory Panel by RT PCR (Flu A&B, Covid) - Nasopharyngeal Swab     Status: None   Collection Time: 03/22/20  7:08 PM   Specimen: Nasopharyngeal Swab  Result Value Ref Range Status   SARS Coronavirus 2 by RT PCR NEGATIVE NEGATIVE Final    Comment: (NOTE) SARS-CoV-2 target nucleic acids are NOT DETECTED.  The SARS-CoV-2 RNA is generally detectable in upper respiratoy specimens during the acute phase of infection. The lowest concentration of SARS-CoV-2 viral copies this assay can detect is 131 copies/mL. A negative result does not preclude SARS-Cov-2 infection and should not be used as the sole basis for  treatment or other patient management decisions. A negative result may occur with  improper specimen collection/handling, submission of specimen other than nasopharyngeal swab, presence of viral mutation(s) within the areas targeted by this assay, and inadequate number of viral copies (<131 copies/mL). A negative result must be combined with clinical observations, patient history, and epidemiological information. The expected result is Negative.  Fact Sheet for Patients:  PinkCheek.be  Fact Sheet for Healthcare Providers:  GravelBags.it  This test is no t yet approved or cleared by the [place] FDA and  has been authorized for detection and/or diagnosis of SARS-CoV-2 by FDA under an Emergency Use Authorization (EUA). This EUA will remain  in effect (meaning this test can be used) for the duration of the COVID-19 declaration under Section 564(b)(1) of the Act, 21 U.S.C. section 360bbb-3(b)(1), unless the authorization is terminated or revoked sooner.     Influenza A by PCR NEGATIVE NEGATIVE Final   Influenza B by PCR NEGATIVE NEGATIVE Final    Comment: (NOTE) The Xpert Xpress SARS-CoV-2/FLU/RSV assay is intended as an aid in  the diagnosis of influenza from Nasopharyngeal swab specimens and  should not be used as a sole basis for treatment. Nasal washings and  aspirates are unacceptable for Xpert Xpress SARS-CoV-2/FLU/RSV  testing.  Fact Sheet for Patients: PinkCheek.be  Fact Sheet for Healthcare Providers: GravelBags.it  This test is not yet approved or cleared by the [place] FDA and  has been authorized for detection and/or diagnosis of SARS-CoV-2 by  FDA under an Emergency Use Authorization (EUA). This EUA will remain  in effect (meaning this test can be used) for the duration of the  Covid-19 declaration under Section 564(b)(1) of the Act, 21   U.S.C. section 360bbb-3(b)(1), unless the authorization is  terminated or revoked. Performed at [facility], [place] [address], [place], [place] [address]      Labs: BNP (last 3 results) No results for input(s): BNP in the last 8760 hours. Basic Metabolic Panel: Recent Labs  Lab 03/22/20 1523 03/22/20 1844 03/23/20 0315 03/24/20 0302  NA 133*  --  135 132*  K 4.5  --  3.9 3.5  CL 101  --  105 103  CO2 22  --  22 23  GLUCOSE 98  --  82 92  BUN 16  --  13 9  CREATININE 0.83  --  0.81 0.59  CALCIUM 8.4*  --  7.8* 7.7*  MG  --  2.2  --   --   PHOS  --   --   --  2.5   Liver Function Tests: Recent Labs  Lab 03/22/20 1523 03/23/20 0315 03/24/20 0302  AST 26 18  --   ALT 17 14  --   ALKPHOS 100 79  --   BILITOT 0.5 0.8  --   PROT 5.7* 4.8*  --   ALBUMIN 3.3* 2.7* 2.5*   Recent Labs  Lab 03/22/20 1523  LIPASE 24   No results for input(s): AMMONIA in the last 168 hours. CBC: Recent Labs  Lab 03/22/20 1523 03/23/20 0315 03/24/20 0302  WBC 20.3* 14.2* 10.3  NEUTROABS  --   --  7.1  HGB 14.1 12.2 11.6*  HCT 43.4 36.9 34.4*  MCV 100.9* 100.0 99.4  PLT 283 226 206   Cardiac Enzymes: No results for input(s): CKTOTAL, CKMB, CKMBINDEX, TROPONINI in the last 168 hours. BNP: Invalid input(s): POCBNP CBG: Recent Labs  Lab 03/22/20 1526  GLUCAP 105*   D-Dimer No results for input(s): DDIMER in the last 72 hours. Hgb A1c No results for input(s): HGBA1C in the last 72 hours. Lipid Profile No results for input(s): CHOL, HDL, LDLCALC, TRIG, CHOLHDL, LDLDIRECT in the last 72 hours. Thyroid function studies No results for input(s): TSH, T4TOTAL, T3FREE, THYROIDAB in the last 72 hours.  Invalid input(s): FREET3 Anemia work up No results for input(s): VITAMINB12, FOLATE, FERRITIN, TIBC, IRON, RETICCTPCT in the last 72 hours. Urinalysis    Component Value Date/Time   COLORURINE YELLOW 03/23/2020 0657   APPEARANCEUR CLEAR 03/23/2020 0657   LABSPEC >1.046  (H) 03/23/2020 0657   PHURINE 7.0 03/23/2020 0657   GLUCOSEU NEGATIVE 03/23/2020 0657   HGBUR SMALL (A) 03/23/2020 0657   BILIRUBINUR NEGATIVE 03/23/2020 0657   KETONESUR NEGATIVE 03/23/2020 0657   PROTEINUR NEGATIVE 03/23/2020 0657   UROBILINOGEN 0.2 04/19/2014 1253   NITRITE NEGATIVE 03/23/2020 0657   LEUKOCYTESUR TRACE (A) 03/23/2020 0657   Sepsis Labs Invalid input(s): PROCALCITONIN,  WBC,  LACTICIDVEN Microbiology Recent Results (from the past 240 hour(s))  Blood culture (routine x 2)     Status: None (Preliminary result)   Collection Time: 03/22/20  6:33 PM   Specimen: BLOOD  Result Value Ref Range Status   Specimen Description BLOOD SITE NOT SPECIFIED  Final   Special Requests   Final    BOTTLES DRAWN AEROBIC AND ANAEROBIC Blood Culture adequate volume   Culture   Final    NO GROWTH 3 DAYS Performed at [facility], [place] [address], [place], [place] [address]    Report Status PENDING  Incomplete  Blood culture (routine x 2)     Status: None (Preliminary result)   Collection Time: 03/22/20  6:35 PM   Specimen: BLOOD  Result Value Ref Range Status   Specimen Description BLOOD SITE NOT SPECIFIED  Final   Special Requests   Final    BOTTLES DRAWN AEROBIC AND ANAEROBIC Blood Culture results may not be optimal due to an inadequate volume of blood received in culture bottles   Culture   Final    NO GROWTH 3 DAYS Performed at [facility], [place] [address], [place], [place] [address]    Report Status PENDING  Incomplete  Respiratory Panel by RT PCR (Flu A&B, Covid) - Nasopharyngeal Swab     Status: None   Collection Time: 03/22/20  7:08 PM   Specimen: Nasopharyngeal Swab  Result Value Ref Range Status   SARS Coronavirus 2 by RT PCR NEGATIVE NEGATIVE Final    Comment: (NOTE) SARS-CoV-2 target nucleic acids are NOT DETECTED.  The SARS-CoV-2 RNA is generally detectable in  upper respiratoy specimens during the acute phase of infection. The lowest concentration of  SARS-CoV-2 viral copies this assay can detect is 131 copies/mL. A negative result does not preclude SARS-Cov-2 infection and should not be used as the sole basis for treatment or other patient management decisions. A negative result may occur with  improper specimen collection/handling, submission of specimen other than nasopharyngeal swab, presence of viral mutation(s) within the areas targeted by this assay, and inadequate number of viral copies (<131 copies/mL). A negative result must be combined with clinical observations, patient history, and epidemiological information. The expected result is Negative.  Fact Sheet for Patients:  PinkCheek.be  Fact Sheet for Healthcare Providers:  GravelBags.it  This test is no t yet approved or cleared by the [place] FDA and  has been authorized for detection and/or diagnosis of SARS-CoV-2 by FDA under an Emergency Use Authorization (EUA). This EUA will remain  in effect (meaning this test can be used) for the duration of the COVID-19 declaration under Section 564(b)(1) of the Act, 21 U.S.C. section 360bbb-3(b)(1), unless the authorization is terminated or revoked sooner.     Influenza A by PCR NEGATIVE NEGATIVE Final   Influenza B by PCR NEGATIVE NEGATIVE Final    Comment: (NOTE) The Xpert Xpress SARS-CoV-2/FLU/RSV assay is intended as an aid in  the diagnosis of influenza from Nasopharyngeal swab specimens and  should not be used as a sole basis for treatment. Nasal washings and  aspirates are unacceptable for Xpert Xpress SARS-CoV-2/FLU/RSV  testing.  Fact Sheet for Patients: PinkCheek.be  Fact Sheet for Healthcare Providers: GravelBags.it  This test is not yet approved or cleared by the [place] FDA and  has been authorized for detection and/or diagnosis of SARS-CoV-2 by  FDA under an Emergency Use  Authorization (EUA). This EUA will remain  in effect (meaning this test can be used) for the duration of the  Covid-19 declaration under Section 564(b)(1) of the Act, 21  U.S.C. section 360bbb-3(b)(1), unless the authorization is  terminated or revoked. Performed at [facility], [place] [address], [place], [place] [address]      Time coordinating discharge: 35 minutes  SIGNED:   Jonnie Finner, DO  [facility] 03/25/2020, 12:16 PM   If 7PM-7AM, please contact night-coverage [email or web address]

## 2020-03-25 NOTE — Progress Notes (Signed)
Pt is ambulating in the hall independently with walker. She tolerated regular diet. Denies abd pain. Discharge instructions given to pt. Discharged to home accompanied by her daughter.

## 2020-03-25 NOTE — Progress Notes (Signed)
Physical Therapy Treatment Patient Details Name: Holly Valenzuela MRN: [id number] DOB: [birth year] Today's Date: 03/25/2020    History of Present Illness Pt is a 79 y.o. F with significant PMH of HTN, HLD, tobacco use, COPD, recent admissions for syncope and susbequent rehab stay who presents with 1 day of nausea, vomiting, diarrhea. Colitis demonstrated on CT abdomen.    PT Comments    Patient progressing towards physical therapy goals. Session focused on increasing ambulation distance and balance. Patient ambulated 250' with no AD and min guard, unsteady with head turns. Patient impulsive this session with fast pace and quick turns towards bathroom, cues required for slower pace for safety due to balance deficits. Patient continues to be limited by impaired balance, generalized weakness, decreased activity tolerance, and impaired cognition. Continue to recommend HHPT to address deficits and 24 hour supervision and assistance for safety. PT will continue to follow.    Follow Up Recommendations  Home health PT;Supervision/Assistance - 24 hour     Equipment Recommendations  None recommended by PT    Recommendations for Other Services       Precautions / Restrictions Precautions Precautions: Fall Restrictions Weight Bearing Restrictions: No    Mobility  Bed Mobility Overal bed mobility: Needs Assistance Bed Mobility: Supine to Sit;Sit to Supine     Supine to sit: Supervision;HOB elevated Sit to supine: Supervision      Transfers Overall transfer level: Needs assistance Equipment used: None Transfers: Sit to/from Stand Sit to Stand: Supervision            Ambulation/Gait Ambulation/Gait assistance: Min guard Gait Distance (Feet): 250 Feet Assistive device: None Gait Pattern/deviations: Step-through pattern;Decreased stride length;Decreased dorsiflexion - right;Decreased dorsiflexion - left;Drifts right/left     General Gait Details: Pt unsteady with head  turns during ambulation, min guard for safety and cues for focusing during ambulation.    Stairs             Wheelchair Mobility    Modified Rankin (Stroke Patients Only)       Balance Overall balance assessment: Needs assistance Sitting-balance support: No upper extremity supported;Feet supported Sitting balance-Leahy Scale: Fair     Standing balance support: No upper extremity supported;During functional activity Standing balance-Leahy Scale: Fair                              Cognition Arousal/Alertness: Awake/alert Behavior During Therapy: Impulsive Overall Cognitive Status: Impaired/Different from baseline Area of Impairment: Safety/judgement;Memory                     Memory: Decreased short-term memory   Safety/Judgement: Decreased awareness of safety;Decreased awareness of deficits     General Comments: Pt impulsive with OOB mobility, cues for slowed pace with intermittent follow through.      Exercises      General Comments        Pertinent Vitals/Pain Pain Assessment: Faces Faces Pain Scale: Hurts little more Pain Location: Left hip (pt states previous fall) Pain Descriptors / Indicators: Guarding;Grimacing Pain Intervention(s): Monitored during session    Home Living                      Prior Function            PT Goals (current goals can now be found in the care plan section) Acute Rehab PT Goals Patient Stated Goal: to go home PT Goal Formulation: With patient Time  For Goal Achievement: 04/06/20 Potential to Achieve Goals: Good Progress towards PT goals: Progressing toward goals    Frequency    Min 3X/week      PT Plan Current plan remains appropriate    Co-evaluation              AM-PAC PT "6 Clicks" Mobility   Outcome Measure  Help needed turning from your back to your side while in a flat bed without using bedrails?: None Help needed moving from lying on your back to sitting on  the side of a flat bed without using bedrails?: None Help needed moving to and from a bed to a chair (including a wheelchair)?: None Help needed standing up from a chair using your arms (e.g., wheelchair or bedside chair)?: None Help needed to walk in hospital room?: A Little Help needed climbing 3-5 steps with a railing? : A Little 6 Click Score: 22    End of Session Equipment Utilized During Treatment: Gait belt Activity Tolerance: Patient tolerated treatment well Patient left: in bed;with call bell/phone within reach;with bed alarm set Nurse Communication: Mobility status PT Visit Diagnosis: Difficulty in walking, not elsewhere classified (R26.2);Muscle weakness (generalized) (M62.81)     Time: 0045-9977 PT Time Calculation (min) (ACUTE ONLY): 18 min  Charges:  $Therapeutic Activity: 8-22 mins                     Holly Valenzuela, PT, DPT Acute Rehabilitation Services Pager [phone] Office [phone]    Holly Valenzuela 03/25/2020, 9:02 AM

## 2020-03-27 LAB — CULTURE, BLOOD (ROUTINE X 2)
Culture: NO GROWTH
Culture: NO GROWTH

## 2020-04-23 ENCOUNTER — Ambulatory Visit: Payer: Medicare Other | Attending: Internal Medicine

## 2020-04-23 DIAGNOSIS — Z23 Encounter for immunization: Secondary | ICD-10-CM

## 2020-04-23 NOTE — Progress Notes (Signed)
   Covid-19 Vaccination Clinic  Name:  Holly Valenzuela    MRN: [id number] DOB: 1941/01/11  04/23/2020  Holly Valenzuela was observed post Covid-19 immunization for 15 minutes without incident. She was provided with Vaccine Information Sheet and instruction to access the V-Safe system.   Holly Valenzuela was instructed to call 911 with any severe reactions post vaccine: Marland Kitchen Difficulty breathing  . Swelling of face and throat  . A fast heartbeat  . A bad rash all over body  . Dizziness and weakness   Immunizations Administered    Name Date Dose VIS Date Route   Pfizer COVID-19 Vaccine 04/23/2020  1:42 PM 0.3 mL 03/20/2020 Intramuscular   Manufacturer: [place]   Lot: [id number]   [place]: [address]-6

## 2020-05-06 ENCOUNTER — Ambulatory Visit: Payer: Medicare Other | Admitting: Cardiology

## 2020-06-04 ENCOUNTER — Other Ambulatory Visit (INDEPENDENT_AMBULATORY_CARE_PROVIDER_SITE_OTHER): Payer: Self-pay | Admitting: Orthopaedic Surgery

## 2020-09-07 ENCOUNTER — Other Ambulatory Visit (INDEPENDENT_AMBULATORY_CARE_PROVIDER_SITE_OTHER): Payer: Self-pay | Admitting: Orthopaedic Surgery

## 2020-09-18 ENCOUNTER — Other Ambulatory Visit (HOSPITAL_COMMUNITY): Payer: Self-pay | Admitting: *Deleted

## 2020-09-19 ENCOUNTER — Ambulatory Visit (HOSPITAL_COMMUNITY)
Admission: RE | Admit: 2020-09-19 | Discharge: 2020-09-19 | Disposition: A | Discharge status: Home / Self Care | Payer: Medicare Other | Source: Ambulatory Visit | Attending: Internal Medicine | Admitting: Internal Medicine

## 2020-09-19 ENCOUNTER — Other Ambulatory Visit: Payer: Self-pay

## 2020-09-19 DIAGNOSIS — M810 Age-related osteoporosis without current pathological fracture: Secondary | ICD-10-CM | POA: Insufficient documentation

## 2020-09-19 MED ORDER — DENOSUMAB 60 MG/ML ~~LOC~~ SOSY
PREFILLED_SYRINGE | SUBCUTANEOUS | Status: AC
  Filled 2020-09-19: qty 1

## 2020-09-19 MED ORDER — DENOSUMAB 60 MG/ML ~~LOC~~ SOSY
60.0000 mg | PREFILLED_SYRINGE | Freq: Once | SUBCUTANEOUS | Status: AC
  Administered 2020-09-19: 60 mg via SUBCUTANEOUS

## 2020-09-20 ENCOUNTER — Encounter (HOSPITAL_COMMUNITY): Payer: BC Managed Care – PPO

## 2021-03-19 ENCOUNTER — Inpatient Hospital Stay (HOSPITAL_COMMUNITY)
Admission: EM | Admit: 2021-03-19 | Discharge: 2021-03-21 | DRG: 644 | Disposition: A | Discharge status: Home Health | Payer: Medicare Other | Attending: Family Medicine | Admitting: Family Medicine

## 2021-03-19 ENCOUNTER — Other Ambulatory Visit: Payer: Self-pay

## 2021-03-19 ENCOUNTER — Emergency Department (HOSPITAL_COMMUNITY): Payer: Medicare Other

## 2021-03-19 ENCOUNTER — Emergency Department (HOSPITAL_BASED_OUTPATIENT_CLINIC_OR_DEPARTMENT_OTHER): Payer: Medicare Other

## 2021-03-19 ENCOUNTER — Encounter (HOSPITAL_COMMUNITY): Payer: Self-pay | Admitting: Oncology

## 2021-03-19 DIAGNOSIS — N189 Chronic kidney disease, unspecified: Secondary | ICD-10-CM | POA: Diagnosis present

## 2021-03-19 DIAGNOSIS — Z79899 Other long term (current) drug therapy: Secondary | ICD-10-CM

## 2021-03-19 DIAGNOSIS — R609 Edema, unspecified: Secondary | ICD-10-CM | POA: Diagnosis not present

## 2021-03-19 DIAGNOSIS — F17210 Nicotine dependence, cigarettes, uncomplicated: Secondary | ICD-10-CM | POA: Diagnosis present

## 2021-03-19 DIAGNOSIS — R413 Other amnesia: Secondary | ICD-10-CM

## 2021-03-19 DIAGNOSIS — Z820 Family history of epilepsy and other diseases of the nervous system: Secondary | ICD-10-CM

## 2021-03-19 DIAGNOSIS — F32A Depression, unspecified: Secondary | ICD-10-CM | POA: Diagnosis not present

## 2021-03-19 DIAGNOSIS — E222 Syndrome of inappropriate secretion of antidiuretic hormone: Secondary | ICD-10-CM | POA: Diagnosis not present

## 2021-03-19 DIAGNOSIS — Z20822 Contact with and (suspected) exposure to covid-19: Secondary | ICD-10-CM | POA: Diagnosis present

## 2021-03-19 DIAGNOSIS — E785 Hyperlipidemia, unspecified: Secondary | ICD-10-CM | POA: Diagnosis present

## 2021-03-19 DIAGNOSIS — M7989 Other specified soft tissue disorders: Secondary | ICD-10-CM

## 2021-03-19 DIAGNOSIS — F419 Anxiety disorder, unspecified: Secondary | ICD-10-CM | POA: Diagnosis present

## 2021-03-19 DIAGNOSIS — K219 Gastro-esophageal reflux disease without esophagitis: Secondary | ICD-10-CM | POA: Diagnosis present

## 2021-03-19 DIAGNOSIS — E871 Hypo-osmolality and hyponatremia: Secondary | ICD-10-CM | POA: Diagnosis not present

## 2021-03-19 DIAGNOSIS — I10 Essential (primary) hypertension: Secondary | ICD-10-CM | POA: Diagnosis not present

## 2021-03-19 DIAGNOSIS — Z96641 Presence of right artificial hip joint: Secondary | ICD-10-CM | POA: Diagnosis present

## 2021-03-19 DIAGNOSIS — I129 Hypertensive chronic kidney disease with stage 1 through stage 4 chronic kidney disease, or unspecified chronic kidney disease: Secondary | ICD-10-CM | POA: Diagnosis present

## 2021-03-19 DIAGNOSIS — Z8249 Family history of ischemic heart disease and other diseases of the circulatory system: Secondary | ICD-10-CM

## 2021-03-19 DIAGNOSIS — Z823 Family history of stroke: Secondary | ICD-10-CM

## 2021-03-19 DIAGNOSIS — I3139 Other pericardial effusion (noninflammatory): Secondary | ICD-10-CM | POA: Diagnosis present

## 2021-03-19 DIAGNOSIS — J449 Chronic obstructive pulmonary disease, unspecified: Secondary | ICD-10-CM | POA: Diagnosis present

## 2021-03-19 LAB — TSH: TSH: 1.164 u[IU]/mL (ref 0.350–4.500)

## 2021-03-19 LAB — COMPREHENSIVE METABOLIC PANEL
ALT: 12 U/L (ref 0–44)
AST: 20 U/L (ref 15–41)
Albumin: 3.5 g/dL (ref 3.5–5.0)
Alkaline Phosphatase: 120 U/L (ref 38–126)
Anion gap: 7 (ref 5–15)
BUN: 11 mg/dL (ref 8–23)
CO2: 25 mmol/L (ref 22–32)
Calcium: 8.4 mg/dL — ABNORMAL LOW (ref 8.9–10.3)
Chloride: 93 mmol/L — ABNORMAL LOW (ref 98–111)
Creatinine, Ser: 0.62 mg/dL (ref 0.44–1.00)
GFR, Estimated: >60 mL/min (ref >60)
Glucose, Bld: 80 mg/dL (ref 70–99)
Potassium: 3.9 mmol/L (ref 3.5–5.1)
Sodium: 125 mmol/L — ABNORMAL LOW (ref 135–145)
Total Bilirubin: 0.4 mg/dL (ref 0.3–1.2)
Total Protein: 6.3 g/dL — ABNORMAL LOW (ref 6.5–8.1)

## 2021-03-19 LAB — TROPONIN I (HIGH SENSITIVITY)
Troponin I (High Sensitivity): 4 ng/L (ref <18)
Troponin I (High Sensitivity): 5 ng/L (ref <18)

## 2021-03-19 LAB — CBC WITH DIFFERENTIAL/PLATELET
Abs Immature Granulocytes: 0.06 10*3/uL (ref 0.00–0.07)
Basophils Absolute: 0 10*3/uL (ref 0.0–0.1)
Basophils Relative: 0 %
Eosinophils Absolute: 0 10*3/uL (ref 0.0–0.5)
Eosinophils Relative: 1 %
HCT: 39.1 % (ref 36.0–46.0)
Hemoglobin: 13.3 g/dL (ref 12.0–15.0)
Immature Granulocytes: 1 %
Lymphocytes Relative: 14 %
Lymphs Abs: 1.2 10*3/uL (ref 0.7–4.0)
MCH: 34.5 pg — ABNORMAL HIGH (ref 26.0–34.0)
MCHC: 34 g/dL (ref 30.0–36.0)
MCV: 101.3 fL — ABNORMAL HIGH (ref 80.0–100.0)
Monocytes Absolute: 0.6 10*3/uL (ref 0.1–1.0)
Monocytes Relative: 7 %
Neutro Abs: 6.7 10*3/uL (ref 1.7–7.7)
Neutrophils Relative %: 77 %
Platelets: 357 10*3/uL (ref 150–400)
RBC: 3.86 MIL/uL — ABNORMAL LOW (ref 3.87–5.11)
RDW: 14.4 % (ref 11.5–15.5)
WBC: 8.6 10*3/uL (ref 4.0–10.5)
nRBC: 0 % (ref 0.0–0.2)

## 2021-03-19 LAB — BASIC METABOLIC PANEL
Anion gap: 7 (ref 5–15)
BUN: 11 mg/dL (ref 8–23)
CO2: 28 mmol/L (ref 22–32)
Calcium: 8.7 mg/dL — ABNORMAL LOW (ref 8.9–10.3)
Chloride: 94 mmol/L — ABNORMAL LOW (ref 98–111)
Creatinine, Ser: 0.55 mg/dL (ref 0.44–1.00)
GFR, Estimated: >60 mL/min (ref >60)
Glucose, Bld: 78 mg/dL (ref 70–99)
Potassium: 3.9 mmol/L (ref 3.5–5.1)
Sodium: 129 mmol/L — ABNORMAL LOW (ref 135–145)

## 2021-03-19 LAB — BRAIN NATRIURETIC PEPTIDE: B Natriuretic Peptide: 69.2 pg/mL (ref 0.0–100.0)

## 2021-03-19 LAB — RESP PANEL BY RT-PCR (FLU A&B, COVID) ARPGX2
Influenza A by PCR: NEGATIVE
Influenza B by PCR: NEGATIVE
SARS Coronavirus 2 by RT PCR: NEGATIVE

## 2021-03-19 LAB — SODIUM, URINE, RANDOM: Sodium, Ur: 61 mmol/L

## 2021-03-19 MED ORDER — ATORVASTATIN CALCIUM 10 MG PO TABS
10.0000 mg | ORAL_TABLET | Freq: Every day | ORAL | Status: DC
  Administered 2021-03-20 – 2021-03-21 (×2): 10 mg via ORAL
  Filled 2021-03-19 (×2): qty 1

## 2021-03-19 MED ORDER — FLUTICASONE PROPIONATE 50 MCG/ACT NA SUSP
2.0000 | Freq: Every day | NASAL | Status: DC | PRN
  Filled 2021-03-19: qty 16

## 2021-03-19 MED ORDER — LOSARTAN POTASSIUM 25 MG PO TABS
50.0000 mg | ORAL_TABLET | Freq: Every day | ORAL | Status: DC
  Administered 2021-03-20: 50 mg via ORAL
  Filled 2021-03-19: qty 2

## 2021-03-19 MED ORDER — ENOXAPARIN SODIUM 40 MG/0.4ML IJ SOSY
40.0000 mg | PREFILLED_SYRINGE | INTRAMUSCULAR | Status: DC
  Administered 2021-03-19: 40 mg via SUBCUTANEOUS
  Filled 2021-03-19: qty 0.4

## 2021-03-19 MED ORDER — MIRTAZAPINE 30 MG PO TABS
30.0000 mg | ORAL_TABLET | Freq: Every day | ORAL | Status: DC
  Administered 2021-03-19 – 2021-03-20 (×2): 30 mg via ORAL
  Filled 2021-03-19 (×2): qty 1

## 2021-03-19 MED ORDER — SODIUM CHLORIDE 0.9 % IV SOLN: INTRAVENOUS | Status: DC

## 2021-03-19 MED ORDER — PANTOPRAZOLE SODIUM 40 MG PO TBEC
40.0000 mg | DELAYED_RELEASE_TABLET | Freq: Every day | ORAL | Status: DC
  Administered 2021-03-20 – 2021-03-21 (×2): 40 mg via ORAL
  Filled 2021-03-19 (×2): qty 1

## 2021-03-19 MED ORDER — ACETAMINOPHEN 325 MG PO TABS
650.0000 mg | ORAL_TABLET | Freq: Four times a day (QID) | ORAL | Status: DC | PRN
  Administered 2021-03-21: 650 mg via ORAL
  Filled 2021-03-19: qty 2

## 2021-03-19 MED ORDER — SODIUM CHLORIDE 0.9% FLUSH
3.0000 mL | Freq: Two times a day (BID) | INTRAVENOUS | Status: DC
  Administered 2021-03-19 – 2021-03-20 (×2): 3 mL via INTRAVENOUS

## 2021-03-19 MED ORDER — ACETAMINOPHEN 650 MG RE SUPP: 650.0000 mg | Freq: Four times a day (QID) | RECTAL | Status: DC | PRN

## 2021-03-19 NOTE — ED Notes (Signed)
Pt refusing a second EKG at this time.

## 2021-03-19 NOTE — ED Triage Notes (Signed)
Pt bib [facility] from home d/t lower extremity edema.  Seen at [facility] for the same 3 days ago.

## 2021-03-19 NOTE — ED Provider Notes (Signed)
Emergency Medicine Provider Triage Evaluation Note  Holly Valenzuela , a 80 y.o. female  was evaluated in triage.  Pt complains of bilateral leg swelling for the past several days.  She states that she is gradually noticed swelling in both of her legs, worse on her right foot.  States this is never happened to her before, she does not take any diuretics.  She is not complaining of any pain.  No recent injury or illness.  Review of Systems  Positive: Bilateral leg swelling Negative: Chest pain, shortness of breath, lightheaded, dizziness  Physical Exam  BP 139/81   Pulse 71   Temp 97.7 F (36.5 C) (Oral)   Resp 16   SpO2 96%  Gen:   Awake, no distress   Resp:  Normal effort  MSK:   Moves extremities without difficulty  Other:  1+ pitting edema bilaterally to the level of the mid shins, increased redness of bilateral feet, no wounds or obvious cellulitic changes  Medical Decision Making  Medically screening exam initiated at 1:35 PM.  Appropriate orders placed.  Holly Valenzuela was informed that the remainder of the evaluation will be completed by another provider, this initial triage assessment does not replace that evaluation, and the importance of remaining in the ED until their evaluation is complete.     Holly Valenzuela 03/19/21 1337    Dorie Rank, MD 03/20/21 [phone]

## 2021-03-19 NOTE — Progress Notes (Signed)
Lower extremity venous RT study completed.  Preliminary results relayed to Regenia Skeeter, MD.  See CV Proc for preliminary results report.   Darlin Coco, RDMS, RVT

## 2021-03-19 NOTE — H&P (Signed)
History and Physical   Holly Valenzuela:[id number] DOB: [birth year] DOA: 03/19/2021  PCP: Crist Infante, MD   Patient coming from: Home  Chief Complaint: Lower extremity edema  HPI: Holly Valenzuela is a 80 y.o. female with medical history significant of colitis, arthritis, GERD, depression, hypertension, hyperlipidemia, hyponatremia, malnutrition, CKD, COPD, migraines presenting with lower extremity swelling.  Patient reports 3 to 4 days of lower extremity edema right worse than left.  She states this is new.  She does not remember having this ever before in the past however she states she does not really pay close attention.  She reports some decreased p.o. intake for the past couple days.  She also states that since arriving to the ED her edema has improved since her legs have been elevated.  Daughter confirms that patient has been acting unusual on the phone and reported not having a diet for the past few days at least. They had tried calling 911 but ems did not transport to ED at that time. She was instructed to call her doctor but she did not and daughter further reports some confusion.  She denies fever, chills, chest pain, shortness breath, abdominal pain, constipation or diarrhea, nausea, vomiting   ED Course: Vital signs in the ED were stable.  Lab work-up showed CMP with sodium 125, chloride 93, calcium 8.4, protein 6.3.  CBC within normal limits.  BMP normal.  Troponin normal.  Respiratory flu and COVID pending.  Chest x-ray with no acute abnormality but changes of COPD evident.  Right lower extremity DVT study was negative.  Review of Systems: As per HPI otherwise all other systems reviewed and are negative.  Past Medical History:  Diagnosis Date   Arthritis    Basal cell adenocarcinoma    right hand   Complication of anesthesia    SEVERE nausea vomiting   COPD (chronic obstructive pulmonary disease) (HCC)    Depression    no per pt 02-27-16   DJD (degenerative  joint disease)    Endometrial cancer (HCC)    Essential hypertension    Genital warts    Headache(784.0)    HX MIGRAINES   HLD (hyperlipidemia)    Hyperlipidemia    Hypertension    Migraine headache    Occular    Migraines    Osteoporosis hypetens   Recurrent UTI    Tobacco abuse    Vitamin B12 deficiency    Vitamin D deficiency     Past Surgical History:  Procedure Laterality Date   ABDOMINAL HYSTERECTOMY     APPENDECTOMY     CARPAL TUNNEL RELEASE     CESAREAN SECTION     COLONOSCOPY     KYPHOPLASTY     THYROID SURGERY     nodule removal   TOTAL HIP ARTHROPLASTY Right 01/12/2014   Procedure: RIGHT TOTAL HIP ARTHROPLASTY ANTERIOR APPROACH;  Surgeon: Mcarthur Rossetti, MD;  Location: [facility];  Service: Orthopedics;  Laterality: Right;   VERTEBROPLASTY      Social History  reports that she has been smoking cigarettes. She has a 25.00 pack-year smoking history. She has never used smokeless tobacco. She reports that she does not drink alcohol and does not use drugs.  Allergies  Allergen Reactions   Adhesive [Tape] Itching    Family History  Problem Relation Age of Onset   Alzheimer's disease Mother    Heart disease Mother    Stroke Father    Colon cancer Neg Hx    Esophageal cancer  Neg Hx    Stomach cancer Neg Hx    Rectal cancer Neg Hx   Reviewed on admission  Prior to Admission medications   Medication Sig Start Date End Date Taking? Authorizing Provider  ALPRAZolam Duanne Moron) 0.5 MG tablet Take 0.5-1 tablets (0.25-0.5 mg total) by mouth 3 (three) times daily as needed for anxiety. Patient taking differently: Take 0.25-0.5 mg by mouth See admin instructions. Take 0.25 mg by mouth at bedtime and 0.25 mg once a a day as needed for anxiety 03/04/20   Eulogio Bear U, DO  ascorbic acid (VITAMIN C) 500 MG tablet Take 500-1,000 mg by mouth daily.    [provider]  atorvastatin (LIPITOR) 10 MG tablet Take 10 mg by mouth daily.    [provider]   Biotin w/ Vitamins C & E (HAIR SKIN & NAILS GUMMIES PO) Take 1 tablet by mouth See admin instructions. Chew 1 gummie by mouth daily    [provider]  butalbital-acetaminophen-caffeine (FIORICET) 50-325-40 MG tablet Take 0.5-1 tablets by mouth 4 (four) times daily as needed for migraine.  02/11/20   [provider]  Cholecalciferol (VITAMIN D-3) 25 MCG (1000 UT) CAPS Take 1,000 Units by mouth daily.    [provider]  cyanocobalamin (,VITAMIN B-12,) 1000 MCG/ML injection Inject 1,000 mcg into the muscle every 30 (thirty) days.     [provider]  diclofenac (VOLTAREN) 75 MG EC tablet TAKE 1 TABLET BY MOUTH TWICE A DAY WITH MEALS FOR INFLAMMATION/SWELLING 09/09/20   Mcarthur Rossetti, MD  famotidine (PEPCID) 20 MG tablet Take 20 mg by mouth 2 (two) times daily as needed for heartburn or indigestion.    [provider]  feeding supplement, ENSURE COMPLETE, (ENSURE COMPLETE) LIQD Take 237 mLs by mouth daily as needed (for supplementation). 03/25/20   Cherylann Ratel A, DO  fluticasone (FLONASE) 50 MCG/ACT nasal spray Place 2 sprays into both nostrils daily as needed for allergies or rhinitis.     [provider]  losartan (COZAAR) 50 MG tablet Take 50 mg by mouth daily. 02/21/20   [provider]  meclizine (ANTIVERT) 12.5 MG tablet Take 1-2 tablets (12.5-25 mg total) by mouth every 6 (six) hours as needed for dizziness. 03/25/20   Cherylann Ratel A, DO  mirtazapine (REMERON) 15 MG tablet Take 7.5 mg by mouth at bedtime as needed ("for sleep").  01/03/20   [provider]  Multiple Vitamin (MULTIVITAMIN WITH MINERALS) TABS tablet Take 1 tablet by mouth daily. 03/05/20   Geradine Girt, DO  Multiple Vitamins-Minerals (EYE-VITE PLUS LUTEIN PO) Take 1 capsule by mouth 3 (three) times a week.    [provider]  pantoprazole (PROTONIX) 40 MG tablet Take 40 mg by mouth daily before breakfast.     [provider]   Teriparatide, Recombinant, (FORTEO) 600 MCG/2.4ML SOLN Inject 20 mcg into the skin every 6 (six) months.     [provider]  TURMERIC PO Take 1 capsule by mouth at bedtime.    [provider]    Physical Exam: Vitals:   03/19/21 1326 03/19/21 1341 03/19/21 1700 03/19/21 1915  BP: 139/81  131/82 135/82  Pulse: 71  70 62  Resp: 16  18 18   Temp: 97.7 F (36.5 C)     TempSrc: Oral     SpO2: 96%  99% 99%  Weight:  38.1 kg    Height:  5\' 6"  (1.676 m)     Physical Exam Constitutional:  General: She is not in acute distress.    Appearance: Normal appearance.     Comments: Thin elderly female  HENT:     Head: Normocephalic and atraumatic.     Mouth/Throat:     Mouth: Mucous membranes are moist.     Pharynx: Oropharynx is clear.  Eyes:     Extraocular Movements: Extraocular movements intact.     Pupils: Pupils are equal, round, and reactive to light.  Cardiovascular:     Rate and Rhythm: Normal rate and regular rhythm.     Pulses: Normal pulses.     Heart sounds: Normal heart sounds.  Pulmonary:     Effort: Pulmonary effort is normal. No respiratory distress.     Breath sounds: Normal breath sounds.  Abdominal:     General: Bowel sounds are normal. There is no distension.     Palpations: Abdomen is soft.     Tenderness: There is no abdominal tenderness.  Musculoskeletal:        General: No swelling or deformity.     Right lower leg: Edema present.     Left lower leg: Edema (Trace) present.  Skin:    General: Skin is warm and dry.  Neurological:     General: No focal deficit present.     Mental Status: Mental status is at baseline.   Labs on Admission: I have personally reviewed following labs and imaging studies  CBC: Recent Labs  Lab 03/19/21 1350  WBC 8.6  NEUTROABS 6.7  HGB 13.3  HCT 39.1  MCV 101.3*  PLT 300    Basic Metabolic Panel: Recent Labs  Lab 03/19/21 1350  NA 125*  K 3.9  CL 93*  CO2 25  GLUCOSE 80  BUN 11   CREATININE 0.62  CALCIUM 8.4*    GFR: Estimated Creatinine Clearance: 33.2 mL/min (by C-G formula based on SCr of 0.62 mg/dL).  Liver Function Tests: Recent Labs  Lab 03/19/21 1350  AST 20  ALT 12  ALKPHOS 120  BILITOT 0.4  PROT 6.3*  ALBUMIN 3.5    Urine analysis:    Component Value Date/Time   COLORURINE YELLOW 03/23/2020 0657   APPEARANCEUR CLEAR 03/23/2020 0657   LABSPEC >1.046 (H) 03/23/2020 0657   PHURINE 7.0 03/23/2020 0657   GLUCOSEU NEGATIVE 03/23/2020 0657   HGBUR SMALL (A) 03/23/2020 0657   BILIRUBINUR NEGATIVE 03/23/2020 0657   [place] 03/23/2020 0657   PROTEINUR NEGATIVE 03/23/2020 0657   UROBILINOGEN 0.2 04/19/2014 1253   NITRITE NEGATIVE 03/23/2020 0657   LEUKOCYTESUR TRACE (A) 03/23/2020 0657    Radiological Exams on Admission: DG Chest 2 View  Result Date: 03/19/2021 CLINICAL DATA:  Edema, swelling EXAM: CHEST - 2 VIEW COMPARISON:  02/28/2020 FINDINGS: There is hyperinflation of the lungs compatible with COPD. Heart and mediastinal contours are within normal limits. No focal opacities or effusions. No acute bony abnormality. Aortic atherosclerosis. IMPRESSION: COPD.  No active disease. Electronically Signed   By: Rolm Baptise M.D.   On: 03/19/2021 17:49   VAS [place] LOWER EXTREMITY VENOUS (DVT) (7a-7p)  Result Date: 03/19/2021  Lower Venous DVT Study Patient Name:  AIDEL DAVISSON [id number]  Date of Exam:   03/19/2021 Medical Rec #: [id number]            Accession #:    [id number] Date of Birth: [birth year]            Patient Gender: F Patient Age:   70 years Exam Location[facility]  [facility] Procedure:  VAS [place] LOWER EXTREMITY VENOUS (DVT) Referring Phys: SOPHIA CACCAVALE --------------------------------------------------------------------------------  Indications: Swelling RT>LT.  Comparison Study: No prior studies. Performing Technologist: Darlin Coco RDMS, RVT  Examination Guidelines: A complete evaluation includes B-mode imaging, spectral  Doppler, color Doppler, and power Doppler as needed of all accessible portions of each vessel. Bilateral testing is considered an integral part of a complete examination. Limited examinations for reoccurring indications may be performed as noted. The reflux portion of the exam is performed with the patient in reverse Trendelenburg.  +---------+---------------+---------+-----------+----------+-------------------+ RIGHT    CompressibilityPhasicitySpontaneityPropertiesThrombus Aging      +---------+---------------+---------+-----------+----------+-------------------+ CFV      Full           Yes      Yes                                      +---------+---------------+---------+-----------+----------+-------------------+ SFJ      Full                                                             +---------+---------------+---------+-----------+----------+-------------------+ FV Prox  Full                                                             +---------+---------------+---------+-----------+----------+-------------------+ FV Mid   Full                                                             +---------+---------------+---------+-----------+----------+-------------------+ FV DistalFull                                                             +---------+---------------+---------+-----------+----------+-------------------+ PFV      Full                                                             +---------+---------------+---------+-----------+----------+-------------------+ POP      Full           Yes      Yes                  Appearance of                                                             thickened valve at  popliteal level     +---------+---------------+---------+-----------+----------+-------------------+ PTV      Full                                                              +---------+---------------+---------+-----------+----------+-------------------+ PERO     Full                                                             +---------+---------------+---------+-----------+----------+-------------------+ Gastroc  Full                                                             +---------+---------------+---------+-----------+----------+-------------------+     Summary: RIGHT: - There is no evidence of deep vein thrombosis in the lower extremity.  - No cystic structure found in the popliteal fossa.   *See table(s) above for measurements and observations. Electronically signed by Orlie Pollen on 03/19/2021 at 7:47:40 PM.    Final     EKG: Independently reviewed. sinus rhythm at 73 bpm.  Significant baseline artifact.  Some baseline wander as well.    Assessment/Plan Principal Problem:   Hyponatremia Active Problems:   Depression   Hyperlipidemia   Essential hypertension  Hyponatremia > Patient noted to have sodium of 125.  Possible etiology is dehydration versus decreased solute intake this thin elderly female. > Also noted to have some new lower extremity edema, however no evidence of DVT and BNP is normal with minimal decreased p.o. intake lower suspicion for hypervolemic hyponatremia. - Monitor overnight - Based on degree of sodium deficit appropriate repletion rate would be 50 to 60 cc an hour we will go at 50 cc an hour for now monitor response. - Trend sodium - Check urine sodium, urine awesome, serum osmole's, add on TSH - Holding home mirtazapine Addendum > Repeat sodium showed increase of 4 already, will stop IV fluids for now and continue to monitor rate of rise.  Lower extremity edema > New onset last 3 to 4 days.  Negative DVT study on the right this is larger than left.  Negative BNP.  Also, reassuring that her edema has improved while her legs have been up in the bed in the ED. - Continue to monitor  Hypertension - Continue  losartan  Hyperlipidemia - Continue home atorvastatin  Depression Anxiety - Continue home Xanax - Consider holding mirtazapine however patient is requesting it and this is a very rare cause of hyponatremia  ADDENDUM  Confusion > Problem added after discussion with daughter > Daughter reporting significant gradual decline since sept 2021 when she had a head injury. > Also reports a degree of hoarding at home and is now house bound. > Likely reflects a degree of dementia - Will likely need formal diagnosis and SW consult to help daughter figure out options to continue to care for patient - Delirium precautions  DVT prophylaxis: Lovenox  Code Status:   4.,  After discussion she  states she wants to do whenever her daughter would want her to do, and her daughter would like to be full code.  Family Communication:  Daughter updated by phone, reports that patient sound atypical and wasn't eating/drinking much recently (as per HPI) Disposition Plan:   Patient is from:  Home  Anticipated DC to:  Home  Anticipated DC date:  1 to 2 days  Anticipated DC barriers: None  Consults called:  None  Admission status:  Ulceration, telemetry   Severity of Illness: The appropriate patient status for this patient is OBSERVATION. Observation status is judged to be reasonable and necessary in order to provide the required intensity of service to ensure the patient's safety. The patient's presenting symptoms, physical exam findings, and initial radiographic and laboratory data in the context of their medical condition is felt to place them at decreased risk for further clinical deterioration. Furthermore, it is anticipated that the patient will be medically stable for discharge from the hospital within 2 midnights of admission.     Marcelyn Bruins MD [facility]  How to contact the [facility] Attending or Consulting provider [place] or covering provider during after hours [place], for this patient?    Check the care team in [facility] Main and look for a) attending/consulting [facility] provider listed and b) the [facility] team listed Log into [email or web address] and use [employer]'s universal password to access. If you do not have the password, please contact the hospital operator. Locate the [facility] provider you are looking for under [facility] and page to a number that you can be directly reached. If you still have difficulty reaching the provider, please page the [facility] (Director on Call) for the Hospitalists listed on amion for assistance.  03/19/2021, 8:47 PM

## 2021-03-19 NOTE — ED Notes (Signed)
Patient given sandwich, crackers, peanut butter, drink. RN spoke to patients daughter and gave her an update.

## 2021-03-19 NOTE — ED Provider Notes (Signed)
[name] Provider Note   CSN: [id number] Arrival date & time: 03/19/21  1317     History Chief Complaint  Patient presents with   Leg Swelling    Holly Valenzuela is a 80 y.o. female presenting for leg swelling.  Patient states in the past 2 days she has had swelling of her legs, worse on the right side.  She denies history of similar.  She is not on any diuretics.  She denies fevers, chills, chest pain, shortness of breath, cough, nausea, vomiting, abdominal pain, urinary symptoms, normal bowel movements.  She states she has not really been eating, just been snacking on junk food.  She denies fatigue or feeling weaker than normal.   Pt lives at home by herself.   Additional history obtained from chart review.  History of COPD, depression, endometrial cancer, hypertension, hyperlipidemia,  HPI     Past Medical History:  Diagnosis Date   Arthritis    Basal cell adenocarcinoma    right hand   Complication of anesthesia    SEVERE nausea vomiting   COPD (chronic obstructive pulmonary disease) (HCC)    Depression    no per pt 02-27-16   DJD (degenerative joint disease)    Endometrial cancer ([place])    Essential hypertension    Genital warts    Headache(784.0)    HX MIGRAINES   HLD (hyperlipidemia)    Hyperlipidemia    Hypertension    Migraine headache    Occular    Migraines    Osteoporosis hypetens   Recurrent UTI    Tobacco abuse    Vitamin B12 deficiency    Vitamin D deficiency     Patient Active Problem List   Diagnosis Date Noted   Edema 03/19/2021   Colitis 03/22/2020   Syncope 02/28/2020   Essential hypertension 02/28/2020   Hyponatremia    Pain    Weakness    Protein-calorie malnutrition, severe ([place]) 04/19/2014   Sacral back pain    Failure to thrive in adult 04/18/2014   Tachycardia 04/18/2014   Vitamin D deficiency 04/18/2014   Hyperlipidemia 04/18/2014   Tobacco abuse 04/18/2014   Fall 04/17/2014   Sacral fracture,  closed ([place])    Pure hypercholesterolemia 01/16/2014   Esophageal reflux 01/16/2014   Other B-complex deficiencies 01/16/2014   Arthritis of right hip 01/12/2014   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     Family History  Problem Relation Age of Onset   Alzheimer's disease Mother    Heart disease Mother    Stroke Father    Colon cancer Neg Hx    Esophageal cancer Neg Hx    Stomach cancer Neg Hx    Rectal cancer Neg Hx     Social History  Tobacco Use   Smoking status: Every Day    Packs/day: 0.50    Years: 50.00    Pack years: 25.00    Types: Cigarettes   Smokeless tobacco: Never  Substance Use Topics   Alcohol use: Never   Drug use: No    Home Medications Prior to Admission medications   Medication Sig Start Date End Date Taking? Authorizing Provider  ALPRAZolam Duanne Moron) 0.5 MG tablet Take 0.5-1 tablets (0.25-0.5 mg total) by mouth 3 (three) times daily as needed for anxiety. Patient taking differently: Take 0.5 mg by mouth at bedtime. 03/04/20  Yes Vann, Jessica U, DO  amLODipine (NORVASC) 5 MG tablet Take 5 mg by mouth daily. 01/08/21  Yes [provider]  ascorbic acid (VITAMIN C) 500 MG tablet Take 500-1,000 mg by mouth daily.   Yes [provider]  atorvastatin (LIPITOR) 10 MG tablet Take 10 mg by mouth daily.   Yes [provider]  butalbital-acetaminophen-caffeine (FIORICET) 50-325-40 MG tablet Take 0.5-1 tablets by mouth 4 (four) times daily as needed for migraine.  02/11/20  Yes [provider]  cyanocobalamin (,VITAMIN B-12,) 1000 MCG/ML injection Inject 1,000 mcg into the muscle every 30 (thirty) days.    Yes [provider]  famotidine (PEPCID) 20 MG tablet Take 20 mg by mouth 2 (two) times daily as needed for heartburn or indigestion.   Yes [provider]  fluticasone (FLONASE) 50 MCG/ACT nasal spray Place 2 sprays into both nostrils daily as needed for allergies or rhinitis.    Yes [provider]  losartan (COZAAR) 25 MG tablet Take 50 mg by mouth daily. 02/06/21  Yes [provider]  mirtazapine (REMERON) 30 MG tablet Take 30 mg by mouth at bedtime.   Yes [provider]  pantoprazole (PROTONIX) 40 MG tablet Take 40 mg by mouth daily as needed (heartburn).   Yes [provider]  diclofenac (VOLTAREN) 75 MG EC tablet TAKE 1 TABLET BY MOUTH TWICE A DAY WITH MEALS FOR INFLAMMATION/SWELLING Patient not taking: No sig reported 09/09/20   Mcarthur Rossetti, MD  feeding supplement, ENSURE COMPLETE, (ENSURE COMPLETE) LIQD Take 237 mLs by mouth daily as needed (for supplementation). Patient not taking: No sig reported 03/25/20   Cherylann Ratel A, DO  losartan (COZAAR) 50 MG tablet Take 50 mg by mouth daily. 02/21/20   [provider]  meclizine (ANTIVERT) 12.5 MG tablet Take 1-2 tablets (12.5-25 mg total) by mouth every 6 (six) hours as needed for dizziness. Patient not taking: No sig reported 03/25/20   Cherylann Ratel A, DO  Multiple Vitamin (MULTIVITAMIN WITH MINERALS) TABS tablet Take 1 tablet by mouth daily. Patient not taking: No sig reported 03/05/20   Geradine Girt, DO  Multiple Vitamins-Minerals (EYE-VITE PLUS LUTEIN PO) Take 1 capsule by mouth 3 (three) times a week. Patient not taking: No sig reported    [provider]     Allergies    Adhesive [tape]  Review of Systems   Review of Systems  Cardiovascular:  Positive for leg swelling.  All other systems reviewed and are negative.  Physical Exam Updated Vital Signs BP 138/82 (BP Location: Left Arm)   Pulse 71   Temp 97.8 F (36.6 C) (Oral)   Resp 16   Ht 5\' 6"  (1.676 m)   Wt 38.1 kg   SpO2 98%   BMI 13.56 kg/m   Physical Exam Vitals and nursing note reviewed.  Constitutional:      General: She is  not in acute distress.    Appearance: Normal appearance.     Comments: Resting in the bed in NAD  HENT:     Head: Normocephalic and atraumatic.  Eyes:     Conjunctiva/sclera: Conjunctivae normal.     Pupils: Pupils are equal, round, and reactive to light.  Cardiovascular:     Rate and Rhythm: Normal rate and regular rhythm.     Pulses: Normal pulses.  Pulmonary:     Effort: Pulmonary effort is normal. No respiratory distress.     Breath sounds: Normal breath sounds. No wheezing.     Comments: Speaking in full sentences.  Clear lung sounds in all fields. Abdominal:     General: There is no distension.     Palpations: Abdomen is soft. There is no mass.     Tenderness: There is no abdominal tenderness. There is no guarding or rebound.  Musculoskeletal:        General: Normal range of motion.     Cervical back: Normal range of motion and neck supple.     Right lower leg: Edema present.     Left lower leg: Edema present.     Comments: Bilateral pitting edema, R>L.   Skin:    General: Skin is warm and dry.     Capillary Refill: Capillary refill takes less than 2 seconds.  Neurological:     Mental Status: She is alert and oriented to person, place, and time.  Psychiatric:        Mood and Affect: Mood and affect normal.        Speech: Speech normal.        Behavior: Behavior normal.    ED Results / Procedures / Treatments   Labs (all labs ordered are listed, but only abnormal results are displayed) Labs Reviewed  CBC WITH  DIFFERENTIAL/PLATELET - Abnormal; Notable for the following components:      Result Value   RBC 3.86 (*)    MCV 101.3 (*)    MCH 34.5 (*)    All other components within normal limits  COMPREHENSIVE METABOLIC PANEL - Abnormal; Notable for the following components:   Sodium 125 (*)    Chloride 93 (*)    Calcium 8.4 (*)    Total Protein 6.3 (*)    All other components within normal limits  BASIC METABOLIC PANEL - Abnormal; Notable for the following components:   Sodium 129 (*)    Chloride 94 (*)    Calcium 8.7 (*)    All other components within normal limits  RESP PANEL BY RT-PCR (FLU A&B, COVID) ARPGX2  BRAIN NATRIURETIC PEPTIDE  TSH  SODIUM, URINE, RANDOM  OSMOLALITY, URINE  BASIC METABOLIC PANEL  BASIC METABOLIC PANEL  BASIC METABOLIC PANEL  CBC  OSMOLALITY  TROPONIN I (HIGH SENSITIVITY)  TROPONIN I (HIGH SENSITIVITY)    EKG EKG Interpretation  Date/Time:  Wednesday March 19 2021 20:03:42 EDT Ventricular Rate:  73 PR Interval:  167 QRS Duration: 102 QT Interval:  426 QTC Calculation: 463 R Axis:   75 Text Interpretation: Sinus rhythm Ventricular premature complex Probable left atrial enlargement Anterior infarct, old Poor data quality in current ECG precludes serial comparison Baseline wander in lead(s) V3 Confirmed by Sherwood Gambler [phone]) on 03/19/2021 8:27:57 PM  Radiology DG Chest 2 View  Result Date: 03/19/2021 CLINICAL DATA:  Edema, swelling EXAM: CHEST - 2 VIEW COMPARISON:  02/28/2020 FINDINGS: There is hyperinflation of the lungs compatible with COPD. Heart and mediastinal contours are within normal  limits. No focal opacities or effusions. No acute bony abnormality. Aortic atherosclerosis. IMPRESSION: COPD.  No active disease. Electronically Signed   By: Rolm Baptise M.D.   On: 03/19/2021 17:49   VAS [place] LOWER EXTREMITY VENOUS (DVT) (7a-7p)  Result Date: 03/19/2021  Lower Venous DVT Study Patient Name:  Holly Valenzuela [id number]  Date of Exam:   03/19/2021  Medical Rec #: [id number]            Accession #:    [id number] Date of Birth: 1940/12/09            Patient Gender: F Patient Age:   34 years Exam Location[facility]  [facility] Procedure:      VAS [place] LOWER EXTREMITY VENOUS (DVT) Referring Phys: Reisha Wos --------------------------------------------------------------------------------  Indications: Swelling RT>LT.  Comparison Study: No prior studies. Performing Technologist: Darlin Coco RDMS, RVT  Examination Guidelines: A complete evaluation includes B-mode imaging, spectral Doppler, color Doppler, and power Doppler as needed of all accessible portions of each vessel. Bilateral testing is considered an integral part of a complete examination. Limited examinations for reoccurring indications may be performed as noted. The reflux portion of the exam is performed with the patient in reverse Trendelenburg.  +---------+---------------+---------+-----------+----------+-------------------+ RIGHT    CompressibilityPhasicitySpontaneityPropertiesThrombus Aging      +---------+---------------+---------+-----------+----------+-------------------+ CFV      Full           Yes      Yes                                      +---------+---------------+---------+-----------+----------+-------------------+ SFJ      Full                                                             +---------+---------------+---------+-----------+----------+-------------------+ FV Prox  Full                                                             +---------+---------------+---------+-----------+----------+-------------------+ FV Mid   Full                                                             +---------+---------------+---------+-----------+----------+-------------------+ FV DistalFull                                                             +---------+---------------+---------+-----------+----------+-------------------+ PFV      Full                                                              +---------+---------------+---------+-----------+----------+-------------------+  POP      Full           Yes      Yes                  Appearance of                                                             thickened valve at                                                        popliteal level     +---------+---------------+---------+-----------+----------+-------------------+ PTV      Full                                                             +---------+---------------+---------+-----------+----------+-------------------+ PERO     Full                                                             +---------+---------------+---------+-----------+----------+-------------------+ Gastroc  Full                                                             +---------+---------------+---------+-----------+----------+-------------------+     Summary: RIGHT: - There is no evidence of deep vein thrombosis in the lower extremity.  - No cystic structure found in the popliteal fossa.   *See table(s) above for measurements and observations. Electronically signed by Orlie Pollen on 03/19/2021 at 7:47:40 PM.    Final     Procedures Procedures   Medications Ordered in ED Medications  atorvastatin (LIPITOR) tablet 10 mg (has no administration in time range)  losartan (COZAAR) tablet 50 mg (has no administration in time range)  pantoprazole (PROTONIX) EC tablet 40 mg (has no administration in time range)  fluticasone (FLONASE) 50 MCG/ACT nasal spray 2 spray (has no administration in time range)  enoxaparin (LOVENOX) injection 40 mg (has no administration in time range)  sodium chloride flush (NS) 0.9 % injection 3 mL (3 mLs Intravenous Given 03/19/21 2104)  acetaminophen (TYLENOL) tablet 650 mg (has no administration in time range)    Or  acetaminophen (TYLENOL) suppository 650 mg (has no administration in time  range)  0.9 %  sodium chloride infusion ( Intravenous New Bag/Given 03/19/21 2102)    ED Course  I have reviewed the triage vital signs and the nursing notes.  Pertinent labs & imaging results that were available during my care of the patient were reviewed by me and considered in my medical decision making (see chart for  details).    MDM Rules/Calculators/A&P                           Pt presenting for evaluation of leg swelling. On exam, pt appears frail, but nontoxic. She has bilateral leg edema, R greater than left.  Will obtain labs to look for electrolyte abnormality.  Will obtain BNP and troponin to ensure no cardiac event.  Will obtain chest x-ray to ensure no fluid on the lungs, though patient does not appear symptomatic.   Labs interpreted by me, shows hyponatremia of 125.  This may be due to decreased p.o. intake, however in the setting of patient living at home by herself and appearing frail, she may need observation admission for this.  BNP is negative.  Troponin negative.  COVID and flu negative.  Chest x-ray viewed and independently interpreted by me, no pneumonia.  There was some effusion.  EKG is nonischemic.  DVT ultrasound of the right leg was negative.  Will call for admission.  Discussed with Dr. Trilby Drummer from [facility] hospitalist service, patient to be admitted  Final Clinical Impression(s) / ED Diagnoses Final diagnoses:  Hyponatremia    Rx / DC Orders ED Discharge Orders     None        Franchot Heidelberg, PA-C 03/19/21 2243    Sherwood Gambler, MD 03/19/21 2245

## 2021-03-19 NOTE — ED Notes (Signed)
Blue top tube sent to lab. 

## 2021-03-20 ENCOUNTER — Observation Stay (HOSPITAL_COMMUNITY): Payer: Medicare Other

## 2021-03-20 DIAGNOSIS — Z20822 Contact with and (suspected) exposure to covid-19: Secondary | ICD-10-CM | POA: Diagnosis present

## 2021-03-20 DIAGNOSIS — F17210 Nicotine dependence, cigarettes, uncomplicated: Secondary | ICD-10-CM | POA: Diagnosis present

## 2021-03-20 DIAGNOSIS — Z8249 Family history of ischemic heart disease and other diseases of the circulatory system: Secondary | ICD-10-CM | POA: Diagnosis not present

## 2021-03-20 DIAGNOSIS — J449 Chronic obstructive pulmonary disease, unspecified: Secondary | ICD-10-CM | POA: Diagnosis present

## 2021-03-20 DIAGNOSIS — I129 Hypertensive chronic kidney disease with stage 1 through stage 4 chronic kidney disease, or unspecified chronic kidney disease: Secondary | ICD-10-CM | POA: Diagnosis present

## 2021-03-20 DIAGNOSIS — R600 Localized edema: Secondary | ICD-10-CM

## 2021-03-20 DIAGNOSIS — Z79899 Other long term (current) drug therapy: Secondary | ICD-10-CM | POA: Diagnosis not present

## 2021-03-20 DIAGNOSIS — F419 Anxiety disorder, unspecified: Secondary | ICD-10-CM | POA: Diagnosis present

## 2021-03-20 DIAGNOSIS — E871 Hypo-osmolality and hyponatremia: Secondary | ICD-10-CM | POA: Diagnosis present

## 2021-03-20 DIAGNOSIS — F32A Depression, unspecified: Secondary | ICD-10-CM | POA: Diagnosis present

## 2021-03-20 DIAGNOSIS — E222 Syndrome of inappropriate secretion of antidiuretic hormone: Secondary | ICD-10-CM | POA: Diagnosis present

## 2021-03-20 DIAGNOSIS — Z820 Family history of epilepsy and other diseases of the nervous system: Secondary | ICD-10-CM | POA: Diagnosis not present

## 2021-03-20 DIAGNOSIS — I3139 Other pericardial effusion (noninflammatory): Secondary | ICD-10-CM | POA: Diagnosis present

## 2021-03-20 DIAGNOSIS — K219 Gastro-esophageal reflux disease without esophagitis: Secondary | ICD-10-CM | POA: Diagnosis present

## 2021-03-20 DIAGNOSIS — E785 Hyperlipidemia, unspecified: Secondary | ICD-10-CM | POA: Diagnosis present

## 2021-03-20 DIAGNOSIS — N189 Chronic kidney disease, unspecified: Secondary | ICD-10-CM | POA: Diagnosis present

## 2021-03-20 DIAGNOSIS — Z823 Family history of stroke: Secondary | ICD-10-CM | POA: Diagnosis not present

## 2021-03-20 DIAGNOSIS — Z96641 Presence of right artificial hip joint: Secondary | ICD-10-CM | POA: Diagnosis present

## 2021-03-20 LAB — BASIC METABOLIC PANEL
Anion gap: 7 (ref 5–15)
Anion gap: 4 — ABNORMAL LOW (ref 5–15)
Anion gap: 7 (ref 5–15)
Anion gap: 9 (ref 5–15)
BUN: 12 mg/dL (ref 8–23)
BUN: 10 mg/dL (ref 8–23)
BUN: 10 mg/dL (ref 8–23)
BUN: 12 mg/dL (ref 8–23)
CO2: 23 mmol/L (ref 22–32)
CO2: 27 mmol/L (ref 22–32)
CO2: 25 mmol/L (ref 22–32)
CO2: 25 mmol/L (ref 22–32)
Calcium: 7.9 mg/dL — ABNORMAL LOW (ref 8.9–10.3)
Calcium: 8.2 mg/dL — ABNORMAL LOW (ref 8.9–10.3)
Calcium: 8.2 mg/dL — ABNORMAL LOW (ref 8.9–10.3)
Calcium: 8.6 mg/dL — ABNORMAL LOW (ref 8.9–10.3)
Chloride: 92 mmol/L — ABNORMAL LOW (ref 98–111)
Chloride: 94 mmol/L — ABNORMAL LOW (ref 98–111)
Chloride: 97 mmol/L — ABNORMAL LOW (ref 98–111)
Chloride: 94 mmol/L — ABNORMAL LOW (ref 98–111)
Creatinine, Ser: 0.52 mg/dL (ref 0.44–1.00)
Creatinine, Ser: 0.58 mg/dL (ref 0.44–1.00)
Creatinine, Ser: 0.57 mg/dL (ref 0.44–1.00)
Creatinine, Ser: 0.8 mg/dL (ref 0.44–1.00)
GFR, Estimated: >60 mL/min (ref >60)
GFR, Estimated: >60 mL/min (ref >60)
GFR, Estimated: >60 mL/min (ref >60)
GFR, Estimated: >60 mL/min (ref >60)
Glucose, Bld: 92 mg/dL (ref 70–99)
Glucose, Bld: 79 mg/dL (ref 70–99)
Glucose, Bld: 91 mg/dL (ref 70–99)
Glucose, Bld: 131 mg/dL — ABNORMAL HIGH (ref 70–99)
Potassium: 3.5 mmol/L (ref 3.5–5.1)
Potassium: 3.7 mmol/L (ref 3.5–5.1)
Potassium: 4.2 mmol/L (ref 3.5–5.1)
Potassium: 4.1 mmol/L (ref 3.5–5.1)
Sodium: 122 mmol/L — ABNORMAL LOW (ref 135–145)
Sodium: 125 mmol/L — ABNORMAL LOW (ref 135–145)
Sodium: 129 mmol/L — ABNORMAL LOW (ref 135–145)
Sodium: 128 mmol/L — ABNORMAL LOW (ref 135–145)

## 2021-03-20 LAB — ECHOCARDIOGRAM COMPLETE
Height: 66 in
Weight: 1418 oz

## 2021-03-20 LAB — CBC
HCT: 36.4 % (ref 36.0–46.0)
Hemoglobin: 12.6 g/dL (ref 12.0–15.0)
MCH: 34.7 pg — ABNORMAL HIGH (ref 26.0–34.0)
MCHC: 34.6 g/dL (ref 30.0–36.0)
MCV: 100.3 fL — ABNORMAL HIGH (ref 80.0–100.0)
Platelets: 367 10*3/uL (ref 150–400)
RBC: 3.63 MIL/uL — ABNORMAL LOW (ref 3.87–5.11)
RDW: 14.3 % (ref 11.5–15.5)
WBC: 10.8 10*3/uL — ABNORMAL HIGH (ref 4.0–10.5)
nRBC: 0 % (ref 0.0–0.2)

## 2021-03-20 LAB — CORTISOL: Cortisol, Plasma: 29.6 ug/dL

## 2021-03-20 LAB — OSMOLALITY: Osmolality: 266 mOsm/kg — ABNORMAL LOW (ref 275–295)

## 2021-03-20 LAB — OSMOLALITY, URINE: Osmolality, Ur: 354 mOsm/kg (ref 300–900)

## 2021-03-20 MED ORDER — ENSURE ENLIVE PO LIQD
237.0000 mL | Freq: Three times a day (TID) | ORAL | Status: DC
  Administered 2021-03-20 – 2021-03-21 (×3): 237 mL via ORAL

## 2021-03-20 MED ORDER — SODIUM CHLORIDE 1 G PO TABS
1.0000 g | ORAL_TABLET | Freq: Two times a day (BID) | ORAL | Status: DC
  Administered 2021-03-20 – 2021-03-21 (×2): 1 g via ORAL
  Filled 2021-03-20 (×4): qty 1

## 2021-03-20 MED ORDER — ENOXAPARIN SODIUM 30 MG/0.3ML IJ SOSY
30.0000 mg | PREFILLED_SYRINGE | INTRAMUSCULAR | Status: DC
  Administered 2021-03-20: 30 mg via SUBCUTANEOUS
  Filled 2021-03-20: qty 0.3

## 2021-03-20 MED ORDER — ADULT MULTIVITAMIN W/MINERALS CH
1.0000 | ORAL_TABLET | Freq: Every day | ORAL | Status: DC
  Administered 2021-03-20 – 2021-03-21 (×2): 1 via ORAL
  Filled 2021-03-20 (×2): qty 1

## 2021-03-20 NOTE — Consult Note (Addendum)
[facility] Inpatient Consult   03/20/2021  Holly Valenzuela Dec 16, 1940 [id number]  [facility]   Referral received from inpatient LCSW for possible [facility] CM post hospital follow up for transition of care needs. Patient's primary provider is Dr. [name].  Spoke with patient at bedside. Explained [facility] CM services. Provided a brochure. Patient states that she lives alone but has neighbors who look in on her sometimes. Denies need for additional meals.    Plan: Will continue to follow for progression. If patient is discharges home will place referral for [facility] CM RN post hospital follow up.  Of note, [facility] Care Management services does not replace or interfere with any services that are arranged by inpatient case management or social work.   [name], MSN, [facility] Liaison Nurse Mobile Phone [phone]  Toll free office [phone]

## 2021-03-20 NOTE — TOC Initial Note (Signed)
Transition of Care [facility]) - Initial/Assessment Note    Patient Details  Name: Holly Valenzuela MRN: [id number] Date of Birth: [birth year]  Transition of Care [facility]) CM/SW Contact:    Lennart Pall, LCSW Phone Number: 03/20/2021, 4:14 PM  Clinical Narrative:                 Met with pt today and contacted daughter, [name], to introduce self/ TOC role and discuss possible dc needs.  Pt was very pleasant and eager to return home today.  She states, "I just came in so they could check the swelling in my legs and they kept me 3 days!"  Pt quickly denies any concerns about her ability to manage alone at home.  Notes daughter is living in [place] and agreeable to have me contact her.  Pt states that she does have home health, however, daughter notes this is just a 4 hour visit from CNA 1 day/week ([employer]).  Pt appears fully oriented and insists she feels she is managing herself "just fine" at home. Good discussion following this with pt's daughter who has had increasing concerns about her mother's ability to manage independently for ~ the past year.  She notes that, in addition to [place], pt also has mobile meal delivery Monday - Friday.  Pt does not drive and cannot easily mobilize outside of her home.  Daughter does not feel that pt is adequately managing her medications and feels this is a safety concern.  Daughter reports she calls pt regularly but pt often gives confusing answers or vague about people who may or may not be coming by (I.e. pt often cannot recall if a mobile meal was brought that day).  We discussed these concerns and daughter understands that this is a longer term planning issue that needs further planning and discussion with her mother.  Encouraged daughter to consider memory care facilities, increase in private duty services or spreading out the hours across additional days but daughter notes that financially there are limits to what can be arranged.  It does  appear that pt may be eligible for [facility] case management and daughter agreeable to have me reach out for referral.  Have contacted [facility], [name], and requested review of case - pt is eligible and has agreed for [facility] to follow for community case management.  Hopeful that this support service can assist with longer term planning and monitoring for needs.  Will follow up again with pt/ daughter tomorrow.  Expected Discharge Plan: Home/Self Care Barriers to Discharge: Continued Medical Work up   Patient Goals and CMS Choice Patient states their goals for this hospitalization and ongoing recovery are:: to go home      Expected Discharge Plan and Services Expected Discharge Plan: Home/Self Care In-house Referral: Clinical Social Work     Living arrangements for the past 2 months: Single Family Home                 DME Arranged: N/A DME Agency: NA                  Prior Living Arrangements/Services Living arrangements for the past 2 months: Single Family Home Lives with:: Self Patient language and need for interpreter reviewed:: Yes Do you feel safe going back to the place where you live?: Yes      Need for Family Participation in Patient Care: Yes (Comment) Care giver support system in place?: No (comment)   Criminal Activity/Legal Involvement Pertinent  to Current Situation/Hospitalization: No - Comment as needed  Activities of Daily Living Home Assistive Devices/Equipment: Walker (specify type) ADL Screening (condition at time of admission) Patient's cognitive ability adequate to safely complete daily activities?: No Is the patient deaf or have difficulty hearing?: Yes Does the patient have difficulty seeing, even when wearing glasses/contacts?: Yes Does the patient have difficulty concentrating, remembering, or making decisions?: Yes Patient able to express need for assistance with ADLs?: Yes Does the patient have difficulty dressing or bathing?: Yes Independently  performs ADLs?: No Communication: Independent Dressing (OT): Independent Grooming: Independent Feeding: Independent Bathing: Independent with device (comment) Toileting: Independent with device (comment) In/Out Bed: Independent with device (comment) Walks in Home: Independent with device (comment) Does the patient have difficulty walking or climbing stairs?: Yes Weakness of Legs: Both Weakness of Arms/Hands: Both  Permission Sought/Granted Permission sought to share information with : Family Supports Permission granted to share information with : Yes, Verbal Permission Granted  Share Information with NAME: Margarita Rana     Permission granted to share info w Relationship: daughter  Permission granted to share info w Contact Information: [phone]  Emotional Assessment Appearance:: Appears stated age Attitude/Demeanor/Rapport: Gracious, Engaged, Self-Confident Affect (typically observed): Pleasant, Accepting Orientation: : Oriented to Self, Oriented to Place, Oriented to  Time, Oriented to Situation Alcohol / Substance Use: Not Applicable Psych Involvement: No (comment)  Admission diagnosis:  Hyponatremia [E87.1] Patient Active Problem List   Diagnosis Date Noted   Edema 03/19/2021   Colitis 03/22/2020   Syncope 02/28/2020   Essential hypertension 02/28/2020   Hyponatremia    Pain    Weakness    Protein-calorie malnutrition, severe ([place]) 04/19/2014   Sacral back pain    Failure to thrive in adult 04/18/2014   Tachycardia 04/18/2014   Vitamin D deficiency 04/18/2014   Hyperlipidemia 04/18/2014   Tobacco abuse 04/18/2014   Fall 04/17/2014   Sacral fracture, closed ([place])    Pure hypercholesterolemia 01/16/2014   Esophageal reflux 01/16/2014   Other B-complex deficiencies 01/16/2014   Arthritis of right hip 01/12/2014   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

## 2021-03-20 NOTE — Progress Notes (Signed)
PROGRESS NOTE    Holly Valenzuela  HAF:[id number] DOB: 27-Oct-1940 DOA: 03/19/2021 PCP: Crist Infante, MD   Chief Complaint  Patient presents with   Leg Swelling   Brief Narrative:  Holly Valenzuela is Holly Valenzuela 80 y.o. female with medical history significant of colitis, arthritis, GERD, depression, hypertension, hyperlipidemia, hyponatremia, malnutrition, CKD, COPD, migraines presenting with lower extremity swelling.  She was noted to have hyponatremia and admitted for further workup and treatment.      Assessment & Plan:   Principal Problem:   Hyponatremia Active Problems:   Depression   Hyperlipidemia   Essential hypertension   Edema  Euvolemic Hyponatremia - picture appears c/w SIADH (my exam without edema, though concern for this yesterday - she appears grossly euvolemic on exam) -> poor solute intake could play Holly Valenzuela role  - Na 125 at presentation, this fluctuated widely yesterday -> down to 122 10/19 PM - 125 this am again -> most recent 128 - really no significant fluid received by patient on 10/19 that I see on chart review - TSH wnl - will check AM cortisol - urine sodium 61 with urine osm 354, fits with SIADH picture - will hold losartan, postmarketing association with hyponatremia - salt tabs and fluid restriction for now, follow response - will need close outpatient follow up  Lower Extremity Edema None apparent on exam at this time Negative RLE [place] Follow echo Seems to have improved with elevation  Hypertension Stop losartan with above  Depression  Anxiety Continue xanax, remeron for now (consider remeron as contributor to hyponatremia)  Concern for cognitive deficit From daughter per discussion last night, gradual decline since head injury Sept 2021 Hoarding at home and home bound Needs neurology outpatient follow up She's Holly Valenzuela&O for me today, has capacity to make medical decisions at this time TSH wnl, follow b12, folate, RPR  DVT prophylaxis:  lovenox Code Status: full Family Communication: none at bedside Disposition:   Status is: inpatinet  The patient will require care spanning > 2 midnights and should be moved to inpatient because: continued management of hyponatremia      Consultants:  none  Procedures:  LE [place] Summary:  RIGHT:  - There is no evidence of deep vein thrombosis in the lower extremity.     - No cystic structure found in the popliteal fossa.   Echo pending  Antimicrobials: Anti-infectives (From admission, onward)    None          Subjective: Denies complaints Notes she presented due to LE edema  Objective: Vitals:   03/19/21 2100 03/20/21 0124 03/20/21 0522 03/20/21 1418  BP: 138/82 106/70 118/80 125/74  Pulse: 71 68 72 81  Resp: 16 16 18 16   Temp: 97.8 F (36.6 C) 98.7 F (37.1 C) 98 F (36.7 C) 98.4 F (36.9 C)  TempSrc: Oral  Oral Oral  SpO2: 98% 100% 94% 98%  Weight:   40.2 kg   Height:        Intake/Output Summary (Last 24 hours) at 03/20/2021 1703 Last data filed at 03/20/2021 1518 Gross per 24 hour  Intake 840 ml  Output 1050 ml  Net -210 ml   Filed Weights   03/19/21 1341 03/20/21 0522  Weight: 38.1 kg 40.2 kg    Examination:  General exam: Appears calm and comfortable.  Cachetic. HEENT: moist mucous membranes Respiratory system: Clear to auscultation. Respiratory effort normal. Cardiovascular system: S1 & S2 heard, RRR.  Gastrointestinal system: Abdomen is nondistended, soft and nontender.  Central  nervous system: Alert and oriented. No focal neurological deficits. Extremities: no LEE Skin: No rashes, lesions or ulcers Psychiatry: Judgement and insight appear normal. Mood & affect appropriate.     Data Reviewed: I have personally reviewed following labs and imaging studies  CBC: Recent Labs  Lab 03/19/21 1350 03/20/21 0536  WBC 8.6 10.8*  NEUTROABS 6.7  --   HGB 13.3 12.6  HCT 39.1 36.4  MCV 101.3* 100.3*  PLT 357 367    Basic  Metabolic Panel: Recent Labs  Lab 03/19/21 1951 03/19/21 2351 03/20/21 0536 03/20/21 0751 03/20/21 1242  NA 129* 122* 125* 129* 128*  K 3.9 3.5 3.7 4.2 4.1  CL 94* 92* 94* 97* 94*  CO2 28 23 27 25 25   GLUCOSE 78 92 79 91 131*  BUN 11 12 10 10 12   CREATININE 0.55 0.52 0.58 0.57 0.80  CALCIUM 8.7* 7.9* 8.2* 8.2* 8.6*    GFR: Estimated Creatinine Clearance: 35 mL/min (by C-G formula based on SCr of 0.8 mg/dL).  Liver Function Tests: Recent Labs  Lab 03/19/21 1350  AST 20  ALT 12  ALKPHOS 120  BILITOT 0.4  PROT 6.3*  ALBUMIN 3.5    CBG: No results for input(s): GLUCAP in the last 168 hours.   Recent Results (from the past 240 hour(s))  Resp Panel by RT-PCR (Flu Holly Valenzuela&B, Covid) Nasopharyngeal Swab     Status: None   Collection Time: 03/19/21  8:21 PM   Specimen: Nasopharyngeal Swab; Nasopharyngeal(NP) swabs in vial transport medium  Result Value Ref Range Status   SARS Coronavirus 2 by RT PCR NEGATIVE NEGATIVE Final    Comment: (NOTE) SARS-CoV-2 target nucleic acids are NOT DETECTED.  The SARS-CoV-2 RNA is generally detectable in upper respiratory specimens during the acute phase of infection. The lowest concentration of SARS-CoV-2 viral copies this assay can detect is 138 copies/mL. Holly Valenzuela negative result does not preclude SARS-Cov-2 infection and should not be used as the sole basis for treatment or other patient management decisions. Holly Valenzuela negative result may occur with  improper specimen collection/handling, submission of specimen other than nasopharyngeal swab, presence of viral mutation(s) within the areas targeted by this assay, and inadequate number of viral copies(<138 copies/mL). Holly Valenzuela negative result must be combined with clinical observations, patient history, and epidemiological information. The expected result is Negative.  Fact Sheet for Patients:  EntrepreneurPulse.com.au  Fact Sheet for Healthcare Providers:   IncredibleEmployment.be  This test is no t yet approved or cleared by the [place] FDA and  has been authorized for detection and/or diagnosis of SARS-CoV-2 by FDA under an Emergency Use Authorization (EUA). This EUA will remain  in effect (meaning this test can be used) for the duration of the COVID-19 declaration under Section 564(b)(1) of the Act, 21 U.S.C.section 360bbb-3(b)(1), unless the authorization is terminated  or revoked sooner.       Influenza Christinamarie Tall by PCR NEGATIVE NEGATIVE Final   Influenza B by PCR NEGATIVE NEGATIVE Final    Comment: (NOTE) The Xpert Xpress SARS-CoV-2/FLU/RSV plus assay is intended as an aid in the diagnosis of influenza from Nasopharyngeal swab specimens and should not be used as Loura Pitt sole basis for treatment. Nasal washings and aspirates are unacceptable for Xpert Xpress SARS-CoV-2/FLU/RSV testing.  Fact Sheet for Patients: EntrepreneurPulse.com.au  Fact Sheet for Healthcare Providers: IncredibleEmployment.be  This test is not yet approved or cleared by the [place] FDA and has been authorized for detection and/or diagnosis of SARS-CoV-2 by FDA under an Emergency Use Authorization (EUA).  This EUA will remain in effect (meaning this test can be used) for the duration of the COVID-19 declaration under Section 564(b)(1) of the Act, 21 U.S.C. section 360bbb-3(b)(1), unless the authorization is terminated or revoked.  Performed at [facility], [place] [address], [place], [place] [address]          Radiology Studies: DG Chest 2 View  Result Date: 03/19/2021 CLINICAL DATA:  Edema, swelling EXAM: CHEST - 2 VIEW COMPARISON:  02/28/2020 FINDINGS: There is hyperinflation of the lungs compatible with COPD. Heart and mediastinal contours are within normal limits. No focal opacities or effusions. No acute bony abnormality. Aortic atherosclerosis. IMPRESSION: COPD.  No active  disease. Electronically Signed   By: Rolm Baptise M.D.   On: 03/19/2021 17:49   VAS [place] LOWER EXTREMITY VENOUS (DVT) (7a-7p)  Result Date: 03/19/2021  Lower Venous DVT Study Patient Name:  FATIHA GUZY SWNIOEVO  Date of Exam:   03/19/2021 Medical Rec #: [id number]            Accession #:    [id number] Date of Birth: [birth year]            Patient Gender: F Patient Age:   87 years Exam Location:  [facility] Procedure:      VAS [place] LOWER EXTREMITY VENOUS (DVT) Referring Phys: SOPHIA CACCAVALE --------------------------------------------------------------------------------  Indications: Swelling RT>LT.  Comparison Study: No prior studies. Performing Technologist: Darlin Coco RDMS, RVT  Examination Guidelines: Margretta Zamorano complete evaluation includes B-mode imaging, spectral Doppler, color Doppler, and power Doppler as needed of all accessible portions of each vessel. Bilateral testing is considered an integral part of Terena Bohan complete examination. Limited examinations for reoccurring indications may be performed as noted. The reflux portion of the exam is performed with the patient in reverse Trendelenburg.  +---------+---------------+---------+-----------+----------+-------------------+ RIGHT    CompressibilityPhasicitySpontaneityPropertiesThrombus Aging      +---------+---------------+---------+-----------+----------+-------------------+ CFV      Full           Yes      Yes                                      +---------+---------------+---------+-----------+----------+-------------------+ SFJ      Full                                                             +---------+---------------+---------+-----------+----------+-------------------+ FV Prox  Full                                                             +---------+---------------+---------+-----------+----------+-------------------+ FV Mid   Full                                                              +---------+---------------+---------+-----------+----------+-------------------+ FV DistalFull                                                             +---------+---------------+---------+-----------+----------+-------------------+  PFV      Full                                                             +---------+---------------+---------+-----------+----------+-------------------+ POP      Full           Yes      Yes                  Appearance of                                                             thickened valve at                                                        popliteal level     +---------+---------------+---------+-----------+----------+-------------------+ PTV      Full                                                             +---------+---------------+---------+-----------+----------+-------------------+ PERO     Full                                                             +---------+---------------+---------+-----------+----------+-------------------+ Gastroc  Full                                                             +---------+---------------+---------+-----------+----------+-------------------+     Summary: RIGHT: - There is no evidence of deep vein thrombosis in the lower extremity.  - No cystic structure found in the popliteal fossa.   *See table(s) above for measurements and observations. Electronically signed by Orlie Pollen on 03/19/2021 at 7:47:40 PM.    Final         Scheduled Meds:  atorvastatin  10 mg Oral Daily   enoxaparin (LOVENOX) injection  30 mg Subcutaneous Q24H   feeding supplement  237 mL Oral TID BM   losartan  50 mg Oral Daily   mirtazapine  30 mg Oral QHS   multivitamin with minerals  1 tablet Oral Daily   pantoprazole  40 mg Oral QAC breakfast   sodium chloride flush  3 mL Intravenous Q12H   sodium chloride  1 g Oral BID WC   Continuous Infusions:   LOS: 0 days    Time  spent: over 30 min  Fayrene Helper, MD [facility]   To contact the attending provider between 7A-7P or the covering provider during after hours 7P-7A, please log into the web site [email or web address] and access using universal [place] password for that web site. If you do not have the password, please call the hospital operator.  03/20/2021, 5:03 PM

## 2021-03-20 NOTE — Progress Notes (Signed)
Initial Nutrition Assessment  DOCUMENTATION CODES:   Underweight, Severe malnutrition in context of social or environmental circumstances  INTERVENTION:   -Ensure Plus PO TID, each provides 350 kcals and 13g protein  -Multivitamin with minerals daily  -Magic cup BID with meals, each supplement provides 290 kcal and 9 grams of protein  NUTRITION DIAGNOSIS:   Severe Malnutrition related to social / environmental circumstances (depression) as evidenced by severe fat depletion, severe muscle depletion.  GOAL:   Patient will meet greater than or equal to 90% of their needs  MONITOR:   PO intake, Supplement acceptance, Labs, Weight trends, I & O's  REASON FOR ASSESSMENT:   Malnutrition Screening Tool    ASSESSMENT:   80 y.o. female with medical history significant of colitis, arthritis, GERD, depression, hypertension, hyperlipidemia, hyponatremia, malnutrition, CKD, COPD, migraines presenting with lower extremity swelling.  Patient sitting up in bed, just got done walking around, she reports. Pt eager to go home, states her leg swelling has improved and she just wants to go.  When trying to obtain information on recent intakes pt states she is eating well at home. Reports from daughter, however, state she has not been eating that well at home at least for the last 2-3 days. Pt has had a noticeable decline since sustaining a head injury in September 2021. Pt has had increased confusion and may not be a reliable historian at this time.   Reports she didn't eat any breakfast as it was cold. She doesn't want to order lunch as she feels she is going home.   Denies issues with swallowing or chewing.  Has a history of vitamin D and B-12 deficiency. Per home meds, pt receives Vitamin B-12 shots monthly. No vitamin D supplementation noted.  Will order Ensure supplements, daily MVI and Magic cups with meals as pt with severe malnutrition.   Per weight records, no significant weight loss  noted.   Medications: Remeron, Multivitamin with minerals daily  Labs reviewed:  Low Na  NUTRITION - FOCUSED PHYSICAL EXAM:  Flowsheet Row Most Recent Value  Orbital Region Moderate depletion  Upper Arm Region Severe depletion  Thoracic and Lumbar Region Unable to assess  Buccal Region Severe depletion  Temple Region Severe depletion  Clavicle Bone Region Severe depletion  Clavicle and Acromion Bone Region Severe depletion  Scapular Bone Region Severe depletion  Dorsal Hand Severe depletion  Patellar Region Severe depletion  Anterior Thigh Region Severe depletion  Posterior Calf Region Severe depletion  Edema (RD Assessment) Mild  Hair Reviewed  Eyes Unable to assess  Mouth Unable to assess  Skin Reviewed  Nails Reviewed       Diet Order:   Diet Order             Diet regular Room service appropriate? Yes; Fluid consistency: Thin  Diet effective now                   EDUCATION NEEDS:   Education needs have been addressed  Skin:  Skin Assessment: Reviewed RN Assessment  Last BM:  10/19  Height:   Ht Readings from Last 1 Encounters:  03/19/21 5\' 6"  (1.676 m)    Weight:   Wt Readings from Last 1 Encounters:  03/20/21 40.2 kg    BMI:  Body mass index is 14.3 kg/m.  Estimated Nutritional Needs:   Kcal:  1250-1450  Protein:  65-80g  Fluid:  1.5L/day  Clayton Bibles, MS, RD, LDN Inpatient Clinical Dietitian Contact information available via Amion

## 2021-03-20 NOTE — Progress Notes (Signed)
Mobility Specialist - Progress Note    03/20/21 1204  Mobility  Activity Ambulated in hall  Level of Assistance Standby assist, set-up cues, supervision of patient - no hands on  Assistive Device Front wheel walker  Distance Ambulated (ft) 300 ft  Mobility Ambulated with assistance in hallway  Mobility Response Tolerated well  Mobility performed by Mobility specialist  $Mobility charge 1 Mobility   Upon entry pt was agreeable to walk, but acted impulsively to reach for RW and refused use for gait belt and non slip socks. Pt ambulated 300 ft in hallway and reported no pain, dizziness, or SOB. Pt stated eagerness to d/c home. After session, pt returned to room to sit at [facility] and was left with PT in room.   [place] Specialist Acute Rehabilitation Services Phone: [phone] 03/20/21, 12:07 PM

## 2021-03-20 NOTE — Evaluation (Addendum)
Physical Therapy Evaluation Patient Details Name: Holly Valenzuela MRN: [id number] DOB: 1940-10-02 Today's Date: 03/20/2021  History of Present Illness  Holly Valenzuela is a 80 y.o. female with medical history significant of colitis, arthritis, GERD, depression, hypertension, hyperlipidemia, hyponatremia, malnutrition, CKD, COPD, migraines presenting with lower extremity swelling  Clinical Impression  Patient expresses strong desire to go home. States that she will never come back.   States she will call a cab. Patient reports having a "girl" assist her on thursdays but has not communicated with that person. Patient reports a fall and stated" I dislocated my left shoulder but did not tell anyone because I did not want to go to ER. "Patient endorses some L shoulder discomfort with decreased shoulder elevation. Patient uses Rw at baseline and lives alone.  Patient's daughter lives out of town. Patient most likely near her baseline. Unsure of any increase in caregiver coverage.  Pt admitted with above diagnosis.  Pt currently with functional limitations due to the deficits listed below (see PT Problem List). Pt will benefit from skilled PT to increase their independence and safety with mobility to allow discharge to the venue listed below.        Recommendations for follow up therapy are one component of a multi-disciplinary discharge planning process, led by the attending physician.  Recommendations may be updated based on patient status, additional functional criteria and insurance authorization.  Follow Up Recommendations No PT follow up    Equipment Recommendations  None recommended by PT    Recommendations for Other Services       Precautions / Restrictions Precautions Precautions: Fall      Mobility  Bed Mobility Overal bed mobility: Independent                  Transfers   Equipment used: None                Ambulation/Gait Ambulation/Gait  assistance: Supervision;Min guard Gait Distance (Feet): 20 Feet (x 2)     Gait velocity: decr   General Gait Details: patient observed ambulating with mobi.ity tech using Rw so PT assessed without Rw. patient has shuffle gait witout Ad, slight more stable with RW, mild balance loss with and without Rw  Stairs            Wheelchair Mobility    Modified Rankin (Stroke Patients Only)       Balance Overall balance assessment: Needs assistance Sitting-balance support: Feet supported;No upper extremity supported Sitting balance-Leahy Scale: Good     Standing balance support: During functional activity;No upper extremity supported Standing balance-Leahy Scale: Poor Standing balance comment: noted unsteady after toileting and stnading to wipe, trunk sways a bit.                             Pertinent Vitals/Pain Pain Assessment: Faces Faces Pain Scale: Hurts little more Pain Location: left shoulder to lift to head height Pain Descriptors / Indicators: Discomfort Pain Intervention(s): Monitored during session    Home Living Family/patient expects to be discharged to:: Private residence Living Arrangements: Alone Available Help at Discharge: Personal care attendant Type of Home: House         Home Equipment: Gilford Rile - 2 wheels;Shower seat;Hand held shower head;Cane - single point Additional Comments: has a "dirl" come in on thursdays to do whatever is needed.    Prior Function Level of Independence: Independent with assistive device(s)  Comments: pt reports drives but car giving trouble     Hand Dominance   Dominant Hand: Left    Extremity/Trunk Assessment   Upper Extremity Assessment Upper Extremity Assessment: RUE deficits/detail RUE Deficits / Details: decreased shoulder elevation, to be eval'd by OT, patient reports a fall a few weeks ago    Lower Extremity Assessment Lower Extremity Assessment: Generalized weakness    Cervical /  Trunk Assessment Cervical / Trunk Assessment: Kyphotic  Communication   Communication: No difficulties  Cognition Arousal/Alertness: Awake/alert Behavior During Therapy: WFL for tasks assessed/performed Overall Cognitive Status: No family/caregiver present to determine baseline cognitive functioning Area of Impairment: Safety/judgement;Awareness                         Safety/Judgement: Decreased awareness of deficits Awareness: Anticipatory   General Comments: patient states that she will call a  cab to go home, denies need for any assistance at hope. Seems upset that she is in hospital. I will never listen to anyone again. Asked patient who encouraged her to come, response-"everyone who saw my legs"< could not name anyone specific.      General Comments      Exercises     Assessment/Plan    PT Assessment Patient needs continued PT services  PT Problem List Decreased strength;Decreased mobility;Decreased balance;Decreased activity tolerance       PT Treatment Interventions DME instruction;Therapeutic activities;Gait training;Therapeutic exercise;Patient/family education;Cognitive remediation;Functional mobility training    PT Goals (Current goals can be found in the Care Plan section)  Acute Rehab PT Goals Patient Stated Goal: I am going home. I will calll a cab PT Goal Formulation: With patient Time For Goal Achievement: 04/03/21 Potential to Achieve Goals: Good    Frequency Min 2X/week   Barriers to discharge Decreased caregiver support      Co-evaluation               AM-PAC PT "6 Clicks" Mobility  Outcome Measure Help needed turning from your back to your side while in a flat bed without using bedrails?: None Help needed moving from lying on your back to sitting on the side of a flat bed without using bedrails?: None Help needed moving to and from a bed to a chair (including a wheelchair)?: A Little Help needed standing up from a chair using  your arms (e.g., wheelchair or bedside chair)?: A Little Help needed to walk in hospital room?: A Little Help needed climbing 3-5 steps with a railing? : A Little 6 Click Score: 20    End of Session   Activity Tolerance: Patient tolerated treatment well Patient left: in bed;with call bell/phone within reach;with bed alarm set Nurse Communication: Mobility status PT Visit Diagnosis: Muscle weakness (generalized) (M62.81);Pain Pain - Right/Left: Left Pain - part of body: Shoulder    Time: 1155-1230 PT Time Calculation (min) (ACUTE ONLY): 35 min   Charges:   PT Evaluation $PT Eval Low Complexity: 1 Low PT Treatments $Gait Training: 8-22 mins        Tresa Endo PT Acute Rehabilitation Services Pager [phone] Office [phone]  Claretha Cooper 03/20/2021, 1:03 PM

## 2021-03-21 ENCOUNTER — Other Ambulatory Visit (HOSPITAL_COMMUNITY): Payer: Self-pay

## 2021-03-21 LAB — CBC WITH DIFFERENTIAL/PLATELET
Abs Immature Granulocytes: 0.02 10*3/uL (ref 0.00–0.07)
Basophils Absolute: 0 10*3/uL (ref 0.0–0.1)
Basophils Relative: 0 %
Eosinophils Absolute: 0.2 10*3/uL (ref 0.0–0.5)
Eosinophils Relative: 3 %
HCT: 34.6 % — ABNORMAL LOW (ref 36.0–46.0)
Hemoglobin: 11.7 g/dL — ABNORMAL LOW (ref 12.0–15.0)
Immature Granulocytes: 0 %
Lymphocytes Relative: 26 %
Lymphs Abs: 2.1 10*3/uL (ref 0.7–4.0)
MCH: 33.9 pg (ref 26.0–34.0)
MCHC: 33.8 g/dL (ref 30.0–36.0)
MCV: 100.3 fL — ABNORMAL HIGH (ref 80.0–100.0)
Monocytes Absolute: 0.6 10*3/uL (ref 0.1–1.0)
Monocytes Relative: 7 %
Neutro Abs: 4.9 10*3/uL (ref 1.7–7.7)
Neutrophils Relative %: 64 %
Platelets: 327 10*3/uL (ref 150–400)
RBC: 3.45 MIL/uL — ABNORMAL LOW (ref 3.87–5.11)
RDW: 14.4 % (ref 11.5–15.5)
WBC: 7.8 10*3/uL (ref 4.0–10.5)
nRBC: 0 % (ref 0.0–0.2)

## 2021-03-21 LAB — COMPREHENSIVE METABOLIC PANEL
ALT: 11 U/L (ref 0–44)
AST: 18 U/L (ref 15–41)
Albumin: 2.7 g/dL — ABNORMAL LOW (ref 3.5–5.0)
Alkaline Phosphatase: 99 U/L (ref 38–126)
Anion gap: 7 (ref 5–15)
BUN: 12 mg/dL (ref 8–23)
CO2: 27 mmol/L (ref 22–32)
Calcium: 8.2 mg/dL — ABNORMAL LOW (ref 8.9–10.3)
Chloride: 96 mmol/L — ABNORMAL LOW (ref 98–111)
Creatinine, Ser: 0.55 mg/dL (ref 0.44–1.00)
GFR, Estimated: >60 mL/min (ref >60)
Glucose, Bld: 92 mg/dL (ref 70–99)
Potassium: 4 mmol/L (ref 3.5–5.1)
Sodium: 130 mmol/L — ABNORMAL LOW (ref 135–145)
Total Bilirubin: 0.2 mg/dL — ABNORMAL LOW (ref 0.3–1.2)
Total Protein: 5.2 g/dL — ABNORMAL LOW (ref 6.5–8.1)

## 2021-03-21 LAB — RPR: RPR Ser Ql: NONREACTIVE

## 2021-03-21 LAB — MAGNESIUM: Magnesium: 2 mg/dL (ref 1.7–2.4)

## 2021-03-21 LAB — PHOSPHORUS: Phosphorus: 3.3 mg/dL (ref 2.5–4.6)

## 2021-03-21 LAB — FOLATE: Folate: 10.8 ng/mL (ref >5.9)

## 2021-03-21 LAB — VITAMIN B12: Vitamin B-12: 462 pg/mL (ref 180–914)

## 2021-03-21 MED ORDER — FAMOTIDINE 20 MG PO TABS: 20.0000 mg | ORAL_TABLET | Freq: Every day | ORAL | Status: AC | PRN

## 2021-03-21 MED ORDER — SODIUM CHLORIDE 1 G PO TABS: 1.0000 g | ORAL_TABLET | Freq: Every day | ORAL | 0 refills | Status: DC

## 2021-03-21 MED ORDER — SODIUM CHLORIDE 1 G PO TABS
1.0000 g | ORAL_TABLET | Freq: Every day | ORAL | 0 refills | Status: AC
  Filled 2021-03-21: qty 30, 30d supply, fill #0

## 2021-03-21 NOTE — Progress Notes (Signed)
Discharge instructions reviewed with patient. Patient verbalized that she would not be able to go to pharmacy to pick up sodium tablets. Spoke with MD and switched to outpatient pharmacy. Medication being delivered to room. Patient verbalized full understanding of all discharge instructions.

## 2021-03-21 NOTE — Discharge Summary (Signed)
Physician Discharge Summary  Holly Valenzuela:[id number] DOB: May 29, 1941 DOA: 03/19/2021  PCP: Crist Infante, MD  Admit date: 03/19/2021 Discharge date: 03/21/2021  Time spent: 40 minutes  Recommendations for Outpatient Follow-up:  Follow outpatietn CBC/CMP W/u SIADH further outpatient per PCP Follow pending RPR Attention to sodium outpatient  On salt tabs Follow BP, holding BP meds at discharge Follow PO intake, poor here Follow memory concerns, referral placed to neurology   Discharge Diagnoses:  Principal Problem:   Hyponatremia Active Problems:   Depression   Hyperlipidemia   Essential hypertension   Edema   Discharge Condition: stable  Diet recommendation: heart healthy  Filed Weights   03/19/21 1341 03/20/21 0522  Weight: 38.1 kg 40.2 kg    History of present illness:  Holly Valenzuela is Holly Valenzuela 80 y.o. female with medical history significant of colitis, arthritis, GERD, depression, hypertension, hyperlipidemia, hyponatremia, malnutrition, CKD, COPD, migraines presenting with lower extremity swelling.   She was noted to have hyponatremia and admitted for further workup and treatment.     Her edema resolved promptly, reportedly with elevation.  Echo and LE [place] without cause noted.  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  Improved with salt tabs.    Discharge 10/21 with plans for outpatient follow up, home health if able to arrange, and PCP/neurology follow up.  See below for additional details  Hospital Course:  Euvolemic Hyponatremia - picture appears c/w SIADH (my exam without edema, though concern for this yesterday - she appears grossly euvolemic on exam) -> poor solute intake likely playing Holly Valenzuela role  - Na 125 at presentation, this fluctuated widely on day of admission -> down to 122 10/19 PM - improved to 130 this AM - really no significant fluid received by patient on 10/19  that I see on chart review - TSH wnl - cortisol wnl - urine sodium 61 with urine osm 354, fits with SIADH picture - will hold losartan, postmarketing association with hyponatremia - salt tab daily at discharge - will need close outpatient follow up, w/u further    Lower Extremity Edema None apparent on exam at this time Negative RLE [place] Follow echo - small pericardial effusion, normal EF, grade 1 diastolic dysfunction Seems to have improved with elevation   Hypertension Stop losartan with above Hold amlodipine BP ok today off these   Depression  Anxiety Continue xanax, remeron for now (consider remeron as contributor to hyponatremia)   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 Left ventricular ejection fraction, by estimation, is 60 to 65%. The  left ventricle has normal function. The left ventricle has no regional  wall motion abnormalities. Left ventricular diastolic parameters are  consistent with Grade I diastolic  dysfunction (impaired relaxation).   2. Right ventricular systolic function is normal. The right ventricular  size is normal. Tricuspid regurgitation signal is inadequate for assessing  PA pressure.   3. The mitral valve is normal in structure. No evidence of mitral valve  regurgitation. No evidence of mitral stenosis.   4. The aortic valve is tricuspid. Aortic valve regurgitation is not  visualized. Mild aortic  valve sclerosis is present, with no evidence of  aortic valve stenosis.   5. The inferior vena cava is normal in size with greater than 50%  respiratory variability, suggesting right atrial pressure of 3 mmHg.   6. Holly Valenzuela small  pericardial effusion is present.   LE [place] Summary:  RIGHT:  - There is no evidence of deep vein thrombosis in the lower extremity.     - No cystic structure found in the popliteal fossa. Consultations: none  Discharge Exam: Vitals:   03/20/21 2200 03/21/21 0541  BP: 131/76 115/74  Pulse: 62 69  Resp: 14 16  Temp: (!) 97.5 F (36.4 C) 97.9 F (36.6 C)  SpO2: 98% 93%   Feeling well, eager to d/c home Called daughter, no answer today (discussed yesterday)  General: No acute distress. Thin, cachetic. Cardiovascular: Heart sounds show Holly Valenzuela regular rate, and rhythm.Lungs: Clear to auscultation bilaterally  Abdomen: Soft, nontender, nondistended  Neurological: Alert and oriented 3. Moves all extremities 4  Cranial nerves II through XII grossly intact. Skin: Warm and dry. No rashes or lesions. Extremities: No clubbing or cyanosis. No edema.   Discharge Instructions   Discharge Instructions     Ambulatory referral to Neurology   Complete by: As directed    An appointment is requested in approximately: 4 weeks   Diet - low sodium heart healthy   Complete by: As directed    Discharge instructions   Complete by: As directed    You were seen for edema (swelling) in your legs.  This resolved quickly with elevation and the workup for blood clots and heart failure was not revealing of any significant concerning findings.  Please follow up with Dr. Joylene Valenzuela for follow up of your swelling if this recurs.  You had low sodium.  I think this is because of either poor intake (your low appetite and not eating much) and/or something called SIADH.  We'll start you on Holly Valenzuela salt tablet daily.  Follow up with Dr. Joylene Valenzuela for repeat labs.  We've stopped your blood pressure medicines here and your blood pressure was ok off medications, follow up with Dr. Joylene Valenzuela for follow up of your blood pressure off meds.  You should have neurology follow up outpatient for your memory.   Return for new, recurrent, or  worsening symptoms.  Please ask your PCP to request records from this hospitalization so they know what was done and what the next steps will be.   Increase activity slowly   Complete by: As directed       Allergies as of 03/21/2021       Reactions   Adhesive [tape] Itching        Medication List     STOP taking these medications    amLODipine 5 MG tablet Commonly known as: NORVASC   diclofenac 75 MG EC tablet Commonly known as: VOLTAREN   losartan 25 MG tablet Commonly known as: COZAAR   losartan 50 MG tablet Commonly known as: COZAAR       TAKE these medications    ALPRAZolam 0.5 MG tablet Commonly known as: XANAX Take 0.5-1 tablets (0.25-0.5 mg total) by mouth 3 (three) times daily as needed for anxiety. What changed:  how much to take when to take this   ascorbic acid 500 MG tablet Commonly known as: VITAMIN C Take 500-1,000 mg by mouth daily.   atorvastatin 10 MG tablet Commonly known as: LIPITOR Take 10 mg by mouth daily.   butalbital-acetaminophen-caffeine 50-325-40 MG tablet  Commonly known as: FIORICET Take 0.5-1 tablets by mouth 4 (four) times daily as needed for migraine.   cyanocobalamin 1000 MCG/ML injection Commonly known as: (VITAMIN B-12) Inject 1,000 mcg into the muscle every 30 (thirty) days.   EYE-VITE PLUS LUTEIN PO Take 1 capsule by mouth 3 (three) times Keiyon Plack week.   famotidine 20 MG tablet Commonly known as: PEPCID Take 1 tablet (20 mg total) by mouth daily as needed for heartburn or indigestion. What changed: when to take this   feeding supplement (ENSURE COMPLETE) Liqd Take 237 mLs by mouth daily as needed (for supplementation).   fluticasone 50 MCG/ACT nasal spray Commonly known as: FLONASE Place 2 sprays into both nostrils daily as needed for allergies or rhinitis.   meclizine 12.5 MG tablet Commonly known as: ANTIVERT Take 1-2 tablets (12.5-25 mg total) by mouth every 6 (six) hours as needed for dizziness.    mirtazapine 30 MG tablet Commonly known as: REMERON Take 30 mg by mouth at bedtime.   multivitamin with minerals Tabs tablet Take 1 tablet by mouth daily.   pantoprazole 40 MG tablet Commonly known as: PROTONIX Take 40 mg by mouth daily as needed (heartburn).   sodium chloride 1 g tablet Take 1 tablet (1 g total) by mouth daily. Follow up with Dr. Joylene Valenzuela for repeat labs within Supriya Beaston week       Allergies  Allergen Reactions   Adhesive [Tape] Itching      The results of significant diagnostics from this hospitalization (including imaging, microbiology, ancillary and laboratory) are listed below for reference.    Significant Diagnostic Studies: DG Chest 2 View  Result Date: 03/19/2021 CLINICAL DATA:  Edema, swelling EXAM: CHEST - 2 VIEW COMPARISON:  02/28/2020 FINDINGS: There is hyperinflation of the lungs compatible with COPD. Heart and mediastinal contours are within normal limits. No focal opacities or effusions. No acute bony abnormality. Aortic atherosclerosis. IMPRESSION: COPD.  No active disease. Electronically Signed   By: Rolm Baptise M.D.   On: 03/19/2021 17:49   ECHOCARDIOGRAM COMPLETE  Result Date: 03/20/2021    ECHOCARDIOGRAM REPORT   Patient Name:   VERSA CRATON GEZMOQHU Date of Exam: 03/20/2021 Medical Rec #:  [id number]           Height:       66.0 in Accession #:    [id number]          Weight:       88.6 lb Date of Birth:  December 27, 1940           BSA:          1.415 m Patient Age:    80 years            BP:           118/80 mmHg Patient Gender: F                   HR:           77 bpm. Exam Location:  Inpatient Procedure: 2D Echo, Cardiac Doppler and Color Doppler Indications:     Other abnormalities  History:         Patient has prior history of Echocardiogram examinations, most                  recent 02/29/2020. Risk Factors:Hypertension.  Sonographer:     Helmut Muster Referring Phys:  [place] Diagnosing Phys: Franki Monte IMPRESSIONS  1. Left  ventricular ejection fraction, by estimation, is 60  to 65%. The left ventricle has normal function. The left ventricle has no regional wall motion abnormalities. Left ventricular diastolic parameters are consistent with Grade I diastolic dysfunction (impaired relaxation).  2. Right ventricular systolic function is normal. The right ventricular size is normal. Tricuspid regurgitation signal is inadequate for assessing PA pressure.  3. The mitral valve is normal in structure. No evidence of mitral valve regurgitation. No evidence of mitral stenosis.  4. The aortic valve is tricuspid. Aortic valve regurgitation is not visualized. Mild aortic valve sclerosis is present, with no evidence of aortic valve stenosis.  5. The inferior vena cava is normal in size with greater than 50% respiratory variability, suggesting right atrial pressure of 3 mmHg.  6. Rasheda Ledger small pericardial effusion is present. FINDINGS  Left Ventricle: Left ventricular ejection fraction, by estimation, is 60 to 65%. The left ventricle has normal function. The left ventricle has no regional wall motion abnormalities. The left ventricular internal cavity size was normal in size. There is  no left ventricular hypertrophy. Left ventricular diastolic parameters are consistent with Grade I diastolic dysfunction (impaired relaxation). Right Ventricle: The right ventricular size is normal. No increase in right ventricular wall thickness. Right ventricular systolic function is normal. Tricuspid regurgitation signal is inadequate for assessing PA pressure. Left Atrium: Left atrial size was normal in size. Right Atrium: Right atrial size was normal in size. Pericardium: Jarrad Mclees small pericardial effusion is present. Mitral Valve: The mitral valve is normal in structure. No evidence of mitral valve regurgitation. No evidence of mitral valve stenosis. Tricuspid Valve: The tricuspid valve is normal in structure. Tricuspid valve regurgitation is trivial. Aortic Valve: The  aortic valve is tricuspid. Aortic valve regurgitation is not visualized. Mild aortic valve sclerosis is present, with no evidence of aortic valve stenosis. Pulmonic Valve: The pulmonic valve was normal in structure. Pulmonic valve regurgitation is not visualized. Aorta: The aortic root is normal in size and structure. Venous: The inferior vena cava is normal in size with greater than 50% respiratory variability, suggesting right atrial pressure of 3 mmHg. IAS/Shunts: No atrial level shunt detected by color flow Doppler. [employer] Electronically signed by Franki Monte Signature Date/Time: 03/20/2021/8:25:24 PM    Final    VAS [place] LOWER EXTREMITY VENOUS (DVT) (7a-7p)  Result Date: 03/19/2021  Lower Venous DVT Study Patient Name:  MYOSHA CUADRAS WUJWJXBJ  Date of Exam:   03/19/2021 Medical Rec #: [id number]            Accession #:    [id number] Date of Birth: 1940-06-25            Patient Gender: F Patient Age:   81 years Exam Location:  [facility] Procedure:      VAS [place] LOWER EXTREMITY VENOUS (DVT) Referring Phys: SOPHIA CACCAVALE --------------------------------------------------------------------------------  Indications: Swelling RT>LT.  Comparison Study: No prior studies. Performing Technologist: Darlin Coco RDMS, RVT  Examination Guidelines: Zarin Hagmann complete evaluation includes B-mode imaging, spectral Doppler, color Doppler, and power Doppler as needed of all accessible portions of each vessel. Bilateral testing is considered an integral part of Aimie Wagman complete examination. Limited examinations for reoccurring indications may be performed as noted. The reflux portion of the exam is performed with the patient in reverse Trendelenburg.  +---------+---------------+---------+-----------+----------+-------------------+ RIGHT    CompressibilityPhasicitySpontaneityPropertiesThrombus Aging      +---------+---------------+---------+-----------+----------+-------------------+ CFV      Full           Yes       Yes                                      +---------+---------------+---------+-----------+----------+-------------------+  SFJ      Full                                                             +---------+---------------+---------+-----------+----------+-------------------+ FV Prox  Full                                                             +---------+---------------+---------+-----------+----------+-------------------+ FV Mid   Full                                                             +---------+---------------+---------+-----------+----------+-------------------+ FV DistalFull                                                             +---------+---------------+---------+-----------+----------+-------------------+ PFV      Full                                                             +---------+---------------+---------+-----------+----------+-------------------+ POP      Full           Yes      Yes                  Appearance of                                                             thickened valve at                                                        popliteal level     +---------+---------------+---------+-----------+----------+-------------------+ PTV      Full                                                             +---------+---------------+---------+-----------+----------+-------------------+ PERO     Full                                                             +---------+---------------+---------+-----------+----------+-------------------+  Gastroc  Full                                                             +---------+---------------+---------+-----------+----------+-------------------+     Summary: RIGHT: - There is no evidence of deep vein thrombosis in the lower extremity.  - No cystic structure found in the popliteal fossa.   *See table(s) above for measurements and observations.  Electronically signed by Orlie Pollen on 03/19/2021 at 7:47:40 PM.    Final     Microbiology: Recent Results (from the past 240 hour(s))  Resp Panel by RT-PCR (Flu Lovene Maret&B, Covid) Nasopharyngeal Swab     Status: None   Collection Time: 03/19/21  8:21 PM   Specimen: Nasopharyngeal Swab; Nasopharyngeal(NP) swabs in vial transport medium  Result Value Ref Range Status   SARS Coronavirus 2 by RT PCR NEGATIVE NEGATIVE Final    Comment: (NOTE) SARS-CoV-2 target nucleic acids are NOT DETECTED.  The SARS-CoV-2 RNA is generally detectable in upper respiratory specimens during the acute phase of infection. The lowest concentration of SARS-CoV-2 viral copies this assay can detect is 138 copies/mL. Kimbree Casanas negative result does not preclude SARS-Cov-2 infection and should not be used as the sole basis for treatment or other patient management decisions. Adya Wirz negative result may occur with  improper specimen collection/handling, submission of specimen other than nasopharyngeal swab, presence of viral mutation(s) within the areas targeted by this assay, and inadequate number of viral copies(<138 copies/mL). Christien Frankl negative result must be combined with clinical observations, patient history, and epidemiological information. The expected result is Negative.  Fact Sheet for Patients:  EntrepreneurPulse.com.au  Fact Sheet for Healthcare Providers:  IncredibleEmployment.be  This test is no t yet approved or cleared by the [place] FDA and  has been authorized for detection and/or diagnosis of SARS-CoV-2 by FDA under an Emergency Use Authorization (EUA). This EUA will remain  in effect (meaning this test can be used) for the duration of the COVID-19 declaration under Section 564(b)(1) of the Act, 21 U.S.C.section 360bbb-3(b)(1), unless the authorization is terminated  or revoked sooner.       Influenza Lameshia Hypolite by PCR NEGATIVE NEGATIVE Final   Influenza B by PCR NEGATIVE NEGATIVE  Final    Comment: (NOTE) The Xpert Xpress SARS-CoV-2/FLU/RSV plus assay is intended as an aid in the diagnosis of influenza from Nasopharyngeal swab specimens and should not be used as Jaselle Pryer sole basis for treatment. Nasal washings and aspirates are unacceptable for Xpert Xpress SARS-CoV-2/FLU/RSV testing.  Fact Sheet for Patients: EntrepreneurPulse.com.au  Fact Sheet for Healthcare Providers: IncredibleEmployment.be  This test is not yet approved or cleared by the [place] FDA and has been authorized for detection and/or diagnosis of SARS-CoV-2 by FDA under an Emergency Use Authorization (EUA). This EUA will remain in effect (meaning this test can be used) for the duration of the COVID-19 declaration under Section 564(b)(1) of the Act, 21 U.S.C. section 360bbb-3(b)(1), unless the authorization is terminated or revoked.  Performed at [facility], [place] [address] [place], [place] [address]      Labs: Basic Metabolic Panel: Recent Labs  Lab 03/19/21 2351 03/20/21 0536 03/20/21 0751 03/20/21 1242 03/21/21 0529  NA 122* 125* 129* 128* 130*  K 3.5 3.7 4.2 4.1 4.0  CL 92* 94* 97* 94* 96*  CO2  23 27 25 25 27   GLUCOSE 92 79 91 131* 92  BUN 12 10 10 12 12   CREATININE 0.52 0.58 0.57 0.80 0.55  CALCIUM 7.9* 8.2* 8.2* 8.6* 8.2*  MG  --   --   --   --  2.0  PHOS  --   --   --   --  3.3   Liver Function Tests: Recent Labs  Lab 03/19/21 1350 03/21/21 0529  AST 20 18  ALT 12 11  ALKPHOS 120 99  BILITOT 0.4 0.2*  PROT 6.3* 5.2*  ALBUMIN 3.5 2.7*   No results for input(s): LIPASE, AMYLASE in the last 168 hours. No results for input(s): AMMONIA in the last 168 hours. CBC: Recent Labs  Lab 03/19/21 1350 03/20/21 0536 03/21/21 0529  WBC 8.6 10.8* 7.8  NEUTROABS 6.7  --  4.9  HGB 13.3 12.6 11.7*  HCT 39.1 36.4 34.6*  MCV 101.3* 100.3* 100.3*  PLT 357 367 327   Cardiac Enzymes: No results for input(s):  CKTOTAL, CKMB, CKMBINDEX, TROPONINI in the last 168 hours. BNP: BNP (last 3 results) Recent Labs    03/19/21 1350  BNP 69.2    ProBNP (last 3 results) No results for input(s): PROBNP in the last 8760 hours.  CBG: No results for input(s): GLUCAP in the last 168 hours.     Signed:  Fayrene Helper MD.  [facility] 03/21/2021, 10:14 AM

## 2021-03-21 NOTE — TOC Transition Note (Signed)
Transition of Care [facility]) - CM/SW Discharge Note   Patient Details  Name: Holly Valenzuela MRN: [id number] Date of Birth: 04/11/1941  [facility] CM/SW Contact:  Lennart Pall, LCSW Phone Number: 03/21/2021, 11:22 AM   Clinical Narrative:    Pt medically cleared for dc home today.  Have confirmed that pt is open with [facility] at this time and have alerted to new orders for RN, aide, SW in addition to PT.  Pt referred and accepted for community support with [place].  Pt also referred to [facility] for transport home today - RN to call [phone] when pt ready to leave the building.  All information discussed and agreed upon by pt and daughter, [name].  No further TOC needs.   Final next level of care: [place] Barriers to Discharge: Barriers Resolved   Patient Goals and CMS Choice Patient states their goals for this hospitalization and ongoing recovery are:: to go home      Discharge Placement                       Discharge Plan and Services In-house Referral: Clinical Social Work              DME Arranged: N/A DME Agency: NA       HH Arranged: RN, PT, Nurse's Aide, Social Work [employer] Agency: [place] Date [place]: 03/21/21 Time [place]: 1121 Representative spoke with at [place]: [place] Interventions     Readmission Risk Interventions Readmission Risk Prevention Plan 03/01/2020  Post Dischage Appt Not Complete  Appt Comments rec for SNF  Medication Screening Complete  Transportation Screening Complete  Some recent data might be hidden

## 2021-03-21 NOTE — Evaluation (Signed)
Occupational Therapy Evaluation Patient Details Name: Holly Valenzuela MRN: [id number] DOB: 08-10-1940 Today's Date: 03/21/2021   History of Present Illness Holly Valenzuela is a 80 y.o. female with medical history significant of colitis, arthritis, GERD, depression, hypertension, hyperlipidemia, hyponatremia, malnutrition, CKD, COPD, migraines presenting with lower extremity swelling   Clinical Impression   Holly Valenzuela is an 80 year old woman who demonstrates ability to perform ADLs and functional mobility. Appears to be at her baseline and she reports being at her baseline. She exhibits decreased ROM and strength of left shoulder and reports a fall in April. She also reports pain with all active movement but is still functional. 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 Patient reports she doesn't know of one and therapist recommended starting with PCP. Patient reports not seeing one recently. Nevertheless, therapist recommended f/u in regards to shoulder and also showed patient a stretch and exercise to perform to maintain humeral joint mobility. Patient has no further acute care OT needs.      Recommendations for follow up therapy are one component of a multi-disciplinary discharge planning process, led by the attending physician.  Recommendations may be updated based on patient status, additional functional criteria and insurance authorization.   Follow Up Recommendations  No OT follow up    Equipment Recommendations  None recommended by OT    Recommendations for Other Services       Precautions / Restrictions Precautions Precautions: Fall Restrictions Weight Bearing Restrictions: No      Mobility Bed Mobility Overal bed mobility: Independent                  Transfers   Equipment used: Rolling walker (2 wheeled)              General transfer comment: ambulated around room with RW with no overt loss of balance. Reports feeling at her baseline.    Balance Overall balance assessment: Mild deficits observed, not formally tested                                         ADL either performed or assessed with clinical judgement   ADL Overall ADL's : Independent                                             Vision Patient Visual Report: No change from baseline       Perception     Praxis      Pertinent Vitals/Pain Pain Assessment: Faces Faces Pain Scale: Hurts little more Pain Location: left shoulder with AROM Pain Descriptors / Indicators: Discomfort;Grimacing Pain Intervention(s): Limited activity within patient's tolerance     Hand Dominance Left   Extremity/Trunk Assessment Upper Extremity Assessment Upper Extremity Assessment: RUE deficits/detail;LUE deficits/detail RUE Deficits / Details: WFL ROM and 5/5 strength RUE Sensation: WNL RUE Coordination: WNL LUE Deficits / Details: Grossly functional ROM but pain at shoulder with AROM. 3+/5 shoulder strength. Reports a fall in April and pain since then. Otherwise WFL ROM and 4/5 strength. Grossly functional scapular movement but tight to rib cage. LUE Sensation: WNL LUE Coordination: WNL   Lower Extremity Assessment Lower Extremity Assessment: Defer to PT evaluation  Cervical / Trunk Assessment Cervical / Trunk Assessment: Kyphotic   Communication Communication Communication: No difficulties   Cognition Arousal/Alertness: Awake/alert Behavior During Therapy: WFL for tasks assessed/performed Overall Cognitive Status: Within Functional Limits for tasks assessed                                     General Comments       Exercises     Shoulder Instructions      Home Living Family/patient expects to be discharged to:: Private residence Living Arrangements: Alone Available Help at  Discharge: Personal care attendant Type of Home: House Home Access: Stairs to enter [employer] of Steps: 2-3 Entrance Stairs-Rails: Right Home Layout: One level     Bathroom Shower/Tub: Occupational psychologist: Handicapped height     Home Equipment: Environmental consultant - 2 wheels;Shower seat;Hand held shower head;Cane - single point   Additional Comments: has a "girl" come in on thursdays to do whatever is needed. also has a :guy" that can come to assist      Prior Functioning/Environment Level of Independence: Independent with assistive device(s)        Comments: hasn't drove in a while        OT Problem List: Decreased strength;Decreased range of motion;Impaired UE functional use;Pain      OT Treatment/Interventions:      OT Goals(Current goals can be found in the care plan section) Acute Rehab OT Goals OT Goal Formulation: All assessment and education complete, DC therapy  OT Frequency:     Barriers to D/C:            Co-evaluation              AM-PAC OT "6 Clicks" Daily Activity     Outcome Measure Help from another person eating meals?: None Help from another person taking care of personal grooming?: None Help from another person toileting, which includes using toliet, bedpan, or urinal?: None Help from another person bathing (including washing, rinsing, drying)?: None Help from another person to put on and taking off regular upper body clothing?: None Help from another person to put on and taking off regular lower body clothing?: None 6 Click Score: 24   End of Session Equipment Utilized During Treatment: Rolling walker Nurse Communication: Mobility status  Activity Tolerance: Patient tolerated treatment well Patient left: in bed;with call bell/phone within reach;with bed alarm set  OT Visit Diagnosis: Pain                Time: 1779-3903 OT Time Calculation (min): 15 min Charges:  OT General Charges $OT Visit: 1 Visit OT  Evaluation $OT Eval Low Complexity: 1 Low  Holly Valenzuela, OTR/L [place]  Office [phone] Pager: [phone]   Lenward Chancellor 03/21/2021, 11:25 AM

## 2021-03-25 ENCOUNTER — Encounter (HOSPITAL_COMMUNITY): Payer: Medicare Other

## 2021-03-26 ENCOUNTER — Other Ambulatory Visit (HOSPITAL_COMMUNITY): Payer: Self-pay | Admitting: *Deleted

## 2021-03-27 ENCOUNTER — Encounter (HOSPITAL_COMMUNITY): Payer: Medicare Other

## 2021-03-27 ENCOUNTER — Observation Stay (HOSPITAL_COMMUNITY): Payer: Medicare Other

## 2021-03-27 ENCOUNTER — Encounter (HOSPITAL_COMMUNITY): Payer: Self-pay | Admitting: Emergency Medicine

## 2021-03-27 ENCOUNTER — Emergency Department (HOSPITAL_COMMUNITY): Payer: Medicare Other

## 2021-03-27 ENCOUNTER — Inpatient Hospital Stay (HOSPITAL_COMMUNITY)
Admission: EM | Admit: 2021-03-27 | Discharge: 2021-04-01 | DRG: 641 | Disposition: A | Discharge status: Skilled Nursing Facility | Payer: Medicare Other | Attending: Internal Medicine | Admitting: Internal Medicine

## 2021-03-27 DIAGNOSIS — Z681 Body mass index (BMI) 19 or less, adult: Secondary | ICD-10-CM

## 2021-03-27 DIAGNOSIS — R531 Weakness: Secondary | ICD-10-CM | POA: Diagnosis not present

## 2021-03-27 DIAGNOSIS — R64 Cachexia: Secondary | ICD-10-CM | POA: Diagnosis present

## 2021-03-27 DIAGNOSIS — R52 Pain, unspecified: Secondary | ICD-10-CM

## 2021-03-27 DIAGNOSIS — Z8542 Personal history of malignant neoplasm of other parts of uterus: Secondary | ICD-10-CM

## 2021-03-27 DIAGNOSIS — R413 Other amnesia: Secondary | ICD-10-CM

## 2021-03-27 DIAGNOSIS — E43 Unspecified severe protein-calorie malnutrition: Secondary | ICD-10-CM | POA: Diagnosis not present

## 2021-03-27 DIAGNOSIS — R627 Adult failure to thrive: Secondary | ICD-10-CM | POA: Diagnosis present

## 2021-03-27 DIAGNOSIS — Y92009 Unspecified place in unspecified non-institutional (private) residence as the place of occurrence of the external cause: Secondary | ICD-10-CM

## 2021-03-27 DIAGNOSIS — W19XXXA Unspecified fall, initial encounter: Secondary | ICD-10-CM | POA: Diagnosis present

## 2021-03-27 DIAGNOSIS — S42252A Displaced fracture of greater tuberosity of left humerus, initial encounter for closed fracture: Secondary | ICD-10-CM | POA: Diagnosis present

## 2021-03-27 DIAGNOSIS — F17210 Nicotine dependence, cigarettes, uncomplicated: Secondary | ICD-10-CM | POA: Diagnosis present

## 2021-03-27 DIAGNOSIS — Z20822 Contact with and (suspected) exposure to covid-19: Secondary | ICD-10-CM | POA: Diagnosis present

## 2021-03-27 DIAGNOSIS — Z87440 Personal history of urinary (tract) infections: Secondary | ICD-10-CM

## 2021-03-27 DIAGNOSIS — E538 Deficiency of other specified B group vitamins: Secondary | ICD-10-CM | POA: Diagnosis present

## 2021-03-27 DIAGNOSIS — Z79899 Other long term (current) drug therapy: Secondary | ICD-10-CM

## 2021-03-27 DIAGNOSIS — E871 Hypo-osmolality and hyponatremia: Secondary | ICD-10-CM | POA: Diagnosis present

## 2021-03-27 DIAGNOSIS — E785 Hyperlipidemia, unspecified: Secondary | ICD-10-CM | POA: Diagnosis present

## 2021-03-27 DIAGNOSIS — Z96641 Presence of right artificial hip joint: Secondary | ICD-10-CM | POA: Diagnosis present

## 2021-03-27 DIAGNOSIS — F419 Anxiety disorder, unspecified: Secondary | ICD-10-CM | POA: Diagnosis present

## 2021-03-27 DIAGNOSIS — F32A Depression, unspecified: Secondary | ICD-10-CM | POA: Diagnosis present

## 2021-03-27 DIAGNOSIS — R262 Difficulty in walking, not elsewhere classified: Secondary | ICD-10-CM | POA: Diagnosis present

## 2021-03-27 DIAGNOSIS — I10 Essential (primary) hypertension: Secondary | ICD-10-CM | POA: Diagnosis present

## 2021-03-27 DIAGNOSIS — E222 Syndrome of inappropriate secretion of antidiuretic hormone: Secondary | ICD-10-CM | POA: Diagnosis present

## 2021-03-27 DIAGNOSIS — Z823 Family history of stroke: Secondary | ICD-10-CM

## 2021-03-27 DIAGNOSIS — Z8249 Family history of ischemic heart disease and other diseases of the circulatory system: Secondary | ICD-10-CM

## 2021-03-27 DIAGNOSIS — J449 Chronic obstructive pulmonary disease, unspecified: Secondary | ICD-10-CM | POA: Diagnosis present

## 2021-03-27 DIAGNOSIS — Z820 Family history of epilepsy and other diseases of the nervous system: Secondary | ICD-10-CM

## 2021-03-27 DIAGNOSIS — G43909 Migraine, unspecified, not intractable, without status migrainosus: Secondary | ICD-10-CM | POA: Diagnosis present

## 2021-03-27 DIAGNOSIS — S4292XA Fracture of left shoulder girdle, part unspecified, initial encounter for closed fracture: Secondary | ICD-10-CM

## 2021-03-27 DIAGNOSIS — Z87891 Personal history of nicotine dependence: Secondary | ICD-10-CM

## 2021-03-27 DIAGNOSIS — E559 Vitamin D deficiency, unspecified: Secondary | ICD-10-CM | POA: Diagnosis present

## 2021-03-27 LAB — COMPREHENSIVE METABOLIC PANEL
ALT: 15 U/L (ref 0–44)
AST: 21 U/L (ref 15–41)
Albumin: 3.5 g/dL (ref 3.5–5.0)
Alkaline Phosphatase: 105 U/L (ref 38–126)
Anion gap: 10 (ref 5–15)
BUN: 12 mg/dL (ref 8–23)
CO2: 25 mmol/L (ref 22–32)
Calcium: 9 mg/dL (ref 8.9–10.3)
Chloride: 95 mmol/L — ABNORMAL LOW (ref 98–111)
Creatinine, Ser: 0.63 mg/dL (ref 0.44–1.00)
GFR, Estimated: >60 mL/min (ref >60)
Glucose, Bld: 85 mg/dL (ref 70–99)
Potassium: 4 mmol/L (ref 3.5–5.1)
Sodium: 130 mmol/L — ABNORMAL LOW (ref 135–145)
Total Bilirubin: 0.2 mg/dL — ABNORMAL LOW (ref 0.3–1.2)
Total Protein: 6.2 g/dL — ABNORMAL LOW (ref 6.5–8.1)

## 2021-03-27 LAB — CBC WITH DIFFERENTIAL/PLATELET
Abs Immature Granulocytes: 0.03 10*3/uL (ref 0.00–0.07)
Basophils Absolute: 0 10*3/uL (ref 0.0–0.1)
Basophils Relative: 1 %
Eosinophils Absolute: 0.1 10*3/uL (ref 0.0–0.5)
Eosinophils Relative: 2 %
HCT: 39.2 % (ref 36.0–46.0)
Hemoglobin: 13 g/dL (ref 12.0–15.0)
Immature Granulocytes: 1 %
Lymphocytes Relative: 37 %
Lymphs Abs: 2.2 10*3/uL (ref 0.7–4.0)
MCH: 33.6 pg (ref 26.0–34.0)
MCHC: 33.2 g/dL (ref 30.0–36.0)
MCV: 101.3 fL — ABNORMAL HIGH (ref 80.0–100.0)
Monocytes Absolute: 0.8 10*3/uL (ref 0.1–1.0)
Monocytes Relative: 13 %
Neutro Abs: 2.8 10*3/uL (ref 1.7–7.7)
Neutrophils Relative %: 46 %
Platelets: 417 10*3/uL — ABNORMAL HIGH (ref 150–400)
RBC: 3.87 MIL/uL (ref 3.87–5.11)
RDW: 14.4 % (ref 11.5–15.5)
WBC: 6 10*3/uL (ref 4.0–10.5)
nRBC: 0 % (ref 0.0–0.2)

## 2021-03-27 LAB — URINALYSIS, ROUTINE W REFLEX MICROSCOPIC
Bilirubin Urine: NEGATIVE
Glucose, UA: NEGATIVE mg/dL
Hgb urine dipstick: NEGATIVE
Ketones, ur: NEGATIVE mg/dL
Leukocytes,Ua: NEGATIVE
Nitrite: NEGATIVE
Protein, ur: NEGATIVE mg/dL
Specific Gravity, Urine: 1.008 (ref 1.005–1.030)
pH: 7 (ref 5.0–8.0)

## 2021-03-27 LAB — CK: Total CK: 56 U/L (ref 38–234)

## 2021-03-27 MED ORDER — ACETAMINOPHEN 325 MG PO TABS
650.0000 mg | ORAL_TABLET | Freq: Four times a day (QID) | ORAL | Status: DC | PRN
  Administered 2021-03-27 – 2021-04-01 (×7): 650 mg via ORAL
  Filled 2021-03-27 (×7): qty 2

## 2021-03-27 NOTE — ED Triage Notes (Signed)
Patient brought by her daughter but daughter is no longer at [facility]. Patient states her daughter came to [facility] from [place] unexpectedly, but since daughter was here patient states she asked the daughter to take her to a store to purchase a microwave to make it easier to heat meals from meals-on-wheels because currently her microwave is mounted high on a wall in her condo. Patient was surprised to be taken to the [facility] and has no complaints other than being hungry at [facility]. Patient states she has not eaten since 03/25/2021 due to difficulty reaching microwave.
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
## 2021-03-27 NOTE — ED Notes (Signed)
Pt to xray

## 2021-03-27 NOTE — ED Provider Notes (Signed)
[facility] EMERGENCY DEPARTMENT Provider Note   CSN: [id number] Arrival date & time: 03/27/21  1349     History Chief Complaint  Patient presents with   Failure To Thrive    Holly Valenzuela is a 80 y.o. female BIB her daughter for failure to thrive. Patient was recently admitted for SIADH and hyponatremia. Daughter states that at discharge, the Physician and Social worker did not felt the patient could be discharged home with homehealth. She states that they were not offered SNF placement and daughter was against her being returned home.Daughter is Economist and lives in [place].  States that the home health orders placed by [facility] health are inadequate and would not bring home health out so she called the patient's PCP Dr. Crist Infante.  Yesterday the patient's home health nurse came to the house and immediately contacted the daughter stating that she was absolutely unsafe to stay in her home.  Daughter drove down from [place] and brought her here today.  Dr. Haynes Kerns sent her to the emergency department for failure to thrive.  Daughter reports that she is not eating or drinking.  She is delivered Meals on Wheels but does not eat her food.  She is weak and falling at home despite using her walker.  Dr. Haynes Kerns administered her Mini-Mental status exam which she scored 22 out of 30 and only a 6 on the animal name testing.  He states in discharge paperwork that patient has moderate dementia and is not competent to make her own decisions.  She needs placement for her own safety and does not have any insight into the lack of safety in her situation at home.  She does not have insight into the lack of food and fluid intake.  HPI     Past Medical History:  Diagnosis Date   Arthritis    Basal cell adenocarcinoma    right hand   Complication of anesthesia    SEVERE nausea vomiting   COPD (chronic obstructive pulmonary disease) ([place])    Depression    no per pt 02-27-16   DJD (degenerative  joint disease)    Endometrial cancer (HCC)    Essential hypertension    Genital warts    Headache(784.0)    HX MIGRAINES   HLD (hyperlipidemia)    Hyperlipidemia    Hypertension    Migraine headache    Occular    Migraines    Osteoporosis hypetens   Recurrent UTI    Tobacco abuse    Vitamin B12 deficiency    Vitamin D deficiency     Patient Active Problem List   Diagnosis Date Noted   Weakness generalized 03/28/2021   Edema 03/19/2021   Colitis 03/22/2020   Syncope 02/28/2020   Essential hypertension 02/28/2020   Hyponatremia    Pain    Weakness    Protein-calorie malnutrition, severe ([employer]) 04/19/2014   Sacral back pain    Failure to thrive in adult 04/18/2014   Tachycardia 04/18/2014   Vitamin D deficiency 04/18/2014   Hyperlipidemia 04/18/2014   Tobacco abuse 04/18/2014   Fall 04/17/2014   Sacral fracture, closed ([place])    Pure hypercholesterolemia 01/16/2014   Esophageal reflux 01/16/2014   Other B-complex deficiencies 01/16/2014   Arthritis of right hip 01/12/2014   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   Syncope 11/21/2011   Ribs, multiple fractures 11/21/2011   Leukocytosis 11/21/2011   Depression 11/21/2011    Past Surgical History:  Procedure Laterality Date  ABDOMINAL HYSTERECTOMY     APPENDECTOMY     CARPAL TUNNEL RELEASE     CESAREAN SECTION     COLONOSCOPY     KYPHOPLASTY     THYROID SURGERY     nodule removal   TOTAL HIP ARTHROPLASTY Right 01/12/2014   Procedure: RIGHT TOTAL HIP ARTHROPLASTY ANTERIOR APPROACH;  Surgeon: Mcarthur Rossetti, MD;  Location: [facility];  Service: Orthopedics;  Laterality: Right;   VERTEBROPLASTY       OB History   No obstetric history on file.     Family History  Problem Relation Age of Onset   Alzheimer's disease Mother    Heart disease Mother    Stroke Father    Colon cancer Neg Hx    Esophageal cancer Neg Hx    Stomach cancer Neg Hx    Rectal cancer Neg Hx     Social History    Tobacco Use   Smoking status: Every Day    Packs/day: 0.50    Years: 50.00    Pack years: 25.00    Types: Cigarettes   Smokeless tobacco: Never  Vaping Use   Vaping Use: Never used  Substance Use Topics   Alcohol use: Never   Drug use: No    Home Medications Prior to Admission medications   Medication Sig Start Date End Date Taking? Authorizing Provider  ALPRAZolam Duanne Moron) 0.5 MG tablet Take 0.5-1 tablets (0.25-0.5 mg total) by mouth 3 (three) times daily as needed for anxiety. Patient taking differently: Take 0.5 mg by mouth at bedtime. 03/04/20  Yes Eulogio Bear U, DO  ascorbic acid (VITAMIN C) 500 MG tablet Take 500-1,000 mg by mouth daily.    [provider]  atorvastatin (LIPITOR) 10 MG tablet Take 10 mg by mouth daily.    [provider]  butalbital-acetaminophen-caffeine (FIORICET) 50-325-40 MG tablet Take 0.5-1 tablets by mouth 4 (four) times daily as needed for migraine.  02/11/20   [provider]  cyanocobalamin (,VITAMIN B-12,) 1000 MCG/ML injection Inject 1,000 mcg into the muscle every 30 (thirty) days.     [provider]  famotidine (PEPCID) 20 MG tablet Take 1 tablet (20 mg total) by mouth daily as needed for heartburn or indigestion. 03/21/21   Elodia Florence., MD  fluticasone [facility]) 50 MCG/ACT nasal spray Place 2 sprays into both nostrils daily as needed for allergies or rhinitis.     [provider]  mirtazapine (REMERON) 30 MG tablet Take 30 mg by mouth at bedtime.    [provider]  pantoprazole (PROTONIX) 40 MG tablet Take 40 mg by mouth daily as needed (heartburn).    [provider]  sodium chloride 1 g tablet Take 1 tablet (1 g total) by mouth daily. Follow up with Dr. [name] for repeat labs within a week 03/21/21 04/20/21  Elodia Florence., MD    Allergies    Adhesive [tape], Codeine, and Hydrocodone-acetaminophen  Review of Systems   Review of Systems  Unable to perform  ROS: Dementia   Physical Exam Updated Vital Signs BP 110/70 (BP Location: Left Arm)   Pulse 82   Temp 98.3 F (36.8 C) (Oral)   Resp 16   Ht 5\' 5"  (1.651 m)   Wt 39.8 kg   SpO2 95%   BMI 14.60 kg/m   Physical Exam Vitals reviewed.  Constitutional:      Appearance: She is cachectic.     Comments: Patient with temporal wasting she is severely protein malnourished  and cachectic    ED Results / Procedures / Treatments   Labs (all labs ordered are listed, but only abnormal results are displayed) Labs Reviewed  CBC WITH DIFFERENTIAL/PLATELET - Abnormal; Notable for the following components:      Result Value   MCV 101.3 (*)    Platelets 417 (*)    All other components within normal limits  COMPREHENSIVE METABOLIC PANEL - Abnormal; Notable for the following components:   Sodium 130 (*)    Chloride 95 (*)    Total Protein 6.2 (*)    Total Bilirubin 0.2 (*)    All other components within normal limits  URINALYSIS, ROUTINE W REFLEX MICROSCOPIC - Abnormal; Notable for the following components:   Color, Urine STRAW (*)    All other components within normal limits  CBC - Abnormal; Notable for the following components:   RBC 3.63 (*)    All other components within normal limits  BASIC METABOLIC PANEL - Abnormal; Notable for the following components:   Sodium 129 (*)    Chloride 97 (*)    Calcium 8.5 (*)    All other components within normal limits  CBC - Abnormal; Notable for the following components:   RBC 3.73 (*)    All other components within normal limits  RESP PANEL BY RT-PCR (FLU A&B, COVID) ARPGX2  CK  CREATININE, SERUM    EKG None  Radiology DG Chest 2 View  Result Date: 03/27/2021 CLINICAL DATA:  Fall EXAM: CHEST - 2 VIEW COMPARISON:  Chest radiograph 03/19/2021 FINDINGS: The cardiomediastinal silhouette is within normal limits. The lungs hyperinflated again suggesting COPD. There is no focal consolidation or pulmonary edema. There is no pleural effusion or  pneumothorax. There is no acute osseous abnormality. IMPRESSION: Stable chest with no radiographic evidence of acute cardiopulmonary process. Electronically Signed   By: Valetta Mole M.D.   On: 03/27/2021 15:56   DG Pelvis 1-2 Views  Result Date: 03/27/2021 CLINICAL DATA:  Fall. EXAM: PELVIS - 1-2 VIEW COMPARISON:  Pelvis x-ray 01/12/2014. 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 The bones are osteopenic. There is no acute fracture or dislocation. Left hip joint space is well maintained. Sacroplasty changes noted bilaterally. Soft tissues are within normal limits. IMPRESSION: 1. No acute fracture or dislocation. Electronically Signed   By: Ronney Asters M.D.   On: 03/27/2021 16:03   DG Shoulder 1V Left  Result Date: 03/28/2021 CLINICAL DATA:  Left shoulder pain. EXAM: LEFT SHOULDER COMPARISON:  None. FINDINGS: Mildly displaced fracture of the greater tubercle of the left humeral head. No other acute fracture identified. The bones are osteopenic. There is no dislocation. Soft tissue swelling over the left shoulder. No radiopaque foreign object or soft tissue gas. IMPRESSION: Mildly displaced fracture of the greater tubercle of the left humeral head. Electronically Signed   By: Anner Crete M.D.   On: 03/28/2021 02:27   CT Head Wo Contrast  Result Date: 03/27/2021 CLINICAL DATA:  Facial trauma EXAM: CT HEAD WITHOUT CONTRAST TECHNIQUE: Contiguous axial images were obtained from the base of the skull through the vertex without intravenous contrast. COMPARISON:  03/22/2020 FINDINGS: Brain: 6 There is atrophy and chronic small vessel disease changes. No acute intracranial abnormality. Specifically, no hemorrhage, hydrocephalus, mass lesion, acute infarction, or significant intracranial injury. Vascular: No hyperdense vessel or unexpected calcification. Skull: No acute calvarial abnormality. Sinuses/Orbits: No acute findings Other: None IMPRESSION: Atrophy, chronic microvascular  disease. No acute intracranial abnormality. Electronically Signed   By: Lennette Bihari  Dover M.D.   On: 03/27/2021 22:40   CT Cervical Spine Wo Contrast  Result Date: 03/27/2021 CLINICAL DATA:  Neck trauma, mechanically unstable spine (Age >= 16y) EXAM: CT CERVICAL SPINE WITHOUT CONTRAST TECHNIQUE: Multidetector CT imaging of the cervical spine was performed without intravenous contrast. Multiplanar CT image reconstructions were also generated. COMPARISON:  None. FINDINGS: Alignment: No subluxation. Skull base and vertebrae: No acute fracture. No primary bone lesion or focal pathologic process. Soft tissues and spinal canal: No prevertebral fluid or swelling. No visible canal hematoma. Disc levels: Degenerative disc disease most pronounced from C4-5 through C6-7. Bilateral degenerative facet disease. Upper chest: No acute findings Other: None IMPRESSION: Degenerative disc and facet disease.  No acute bony abnormality. Electronically Signed   By: Rolm Baptise M.D.   On: 03/27/2021 22:41   DG Shoulder Left  Result Date: 03/28/2021 CLINICAL DATA:  Left proximal humerus fracture EXAM: LEFT SHOULDER - 2+ VIEW COMPARISON:  Same day radiograph FINDINGS: There is a mildly displaced greater tuberosity fracture of the left proximal humerus. There is mild displacement and angulation. Adjacent soft tissue swelling. Normal glenohumeral alignment. Normal AC joint alignment. IMPRESSION: Mildly displaced and angulated greater tuberosity fracture. Electronically Signed   By: Maurine Simmering M.D.   On: 03/28/2021 11:41    Procedures Procedures   Medications Ordered in ED Medications  acetaminophen (TYLENOL) tablet 650 mg (650 mg Oral Given 03/27/21 2335)  ALPRAZolam Duanne Moron) tablet 0.5 mg (0.5 mg Oral Given 03/28/21 0042)  mirtazapine (REMERON) tablet 30 mg (30 mg Oral Given 03/28/21 0043)  sodium chloride tablet 1 g (1 g Oral Given 03/28/21 0915)  enoxaparin (LOVENOX) 100 mg/mL injection 20 mg (has no administration in time  range)  hydrALAZINE (APRESOLINE) injection 10 mg (has no administration in time range)    ED Course  I have reviewed the triage vital signs and the nursing notes.  Pertinent labs & imaging results that were available during my care of the patient were reviewed by me and considered in my medical decision making (see chart for details).    MDM Rules/Calculators/A&P                          Patient here with memory issues and home safety issues. I discussed with patient's daughter and HCPOA that placement from the ED is extremely difficult and Iwill need to find a reason for admission. Patient's daughter states :" I need to make myself clear. I will not be taking my mother home from this hospital today, and if she is not placed in a nursing home, I will make this a problem for [place]!"  I discussed that I would discuss this with the hopitalist, but it appears that PT eval did not meet SNF placement criteria. I also asked Our TOC team to discuss options with the patient's daughter.  I reviewed labs- mild hyponatremia. CBC UA, Resp panel wnl. I reviewed imaging which shows no abnormalities. I discussed situation with Dr. Hal Hope- who will bring patient back in for reevaluation.  Final Clinical Impression(s) / ED Diagnoses Final diagnoses:  Memory deficit    Rx / DC Orders ED Discharge Orders     None        Margarita Mail, PA-C 03/28/21 1206    Truddie Hidden, MD 03/31/21 [phone]

## 2021-03-27 NOTE — ED Notes (Signed)
Pt able to get in/out of wheel chair w/ one stand by assist for balance.  She shuffles her feet when she walks.

## 2021-03-27 NOTE — ED Notes (Signed)
Initially pt stated that she did not have any pain but then she stated she fell last week and her left arm hurts.  Pt was able to get out of the wheel chair but staff assisted her to the bed as she was "wobbly".  Daughter now bedside.

## 2021-03-27 NOTE — ED Provider Notes (Signed)
Emergency Medicine Provider Triage Evaluation Note  Holly Valenzuela , a 80 y.o. female  was evaluated in triage.  Patient unsure why she is here.  States her daughter came to visit her from [place] brought her here.      Daughter Michelle>>>  According to family patient has been very weak.  Recent discharge from hospital for hyponatremia.  Home health came out to see patient earlier on in the week and call PCP and although she was unsafe to be at home.  She is fallen multiple times a day.  She has not eaten in the last 3 days she is not able to stand to take care of her self and has not bathed.  Daughter is patient's medical power of attorney.  States she needs admission and placement as she is not safe to be at home.  Apparently having increasing memory problems with concerns for dementia      Review of Systems  Positive: Failure to thrive Negative:   Physical Exam  BP 132/88 (BP Location: Right Arm)   Pulse 71   Temp 97.7 F (36.5 C) (Oral)   Resp 18   SpO2 99%  Gen:   Awake, no distress   Resp:  Normal effort  MSK:   Moves extremities without difficulty  Other:    Medical Decision Making  Medically screening exam initiated at 3:13 PM.  Appropriate orders placed.  Holly Valenzuela was informed that the remainder of the evaluation will be completed by another provider, this initial triage assessment does not replace that evaluation, and the importance of remaining in the ED until their evaluation is complete.   Vital signs stable.  Work-up started.    Federica Allport A, PA-C 03/27/21 1518    Pattricia Boss, MD 03/28/21 1151

## 2021-03-27 NOTE — ED Notes (Signed)
Social worker bedside talking w/ daughter.

## 2021-03-28 ENCOUNTER — Other Ambulatory Visit: Payer: Self-pay

## 2021-03-28 ENCOUNTER — Observation Stay (HOSPITAL_COMMUNITY): Payer: Medicare Other

## 2021-03-28 ENCOUNTER — Encounter (HOSPITAL_COMMUNITY): Payer: Self-pay | Admitting: Internal Medicine

## 2021-03-28 DIAGNOSIS — E871 Hypo-osmolality and hyponatremia: Secondary | ICD-10-CM

## 2021-03-28 DIAGNOSIS — R531 Weakness: Secondary | ICD-10-CM | POA: Diagnosis not present

## 2021-03-28 LAB — BASIC METABOLIC PANEL
Anion gap: 7 (ref 5–15)
BUN: 8 mg/dL (ref 8–23)
CO2: 25 mmol/L (ref 22–32)
Calcium: 8.5 mg/dL — ABNORMAL LOW (ref 8.9–10.3)
Chloride: 97 mmol/L — ABNORMAL LOW (ref 98–111)
Creatinine, Ser: 0.54 mg/dL (ref 0.44–1.00)
GFR, Estimated: >60 mL/min (ref >60)
Glucose, Bld: 82 mg/dL (ref 70–99)
Potassium: 3.8 mmol/L (ref 3.5–5.1)
Sodium: 129 mmol/L — ABNORMAL LOW (ref 135–145)

## 2021-03-28 LAB — CBC
HCT: 36.1 % (ref 36.0–46.0)
HCT: 37.1 % (ref 36.0–46.0)
Hemoglobin: 12.2 g/dL (ref 12.0–15.0)
Hemoglobin: 12.3 g/dL (ref 12.0–15.0)
MCH: 33.6 pg (ref 26.0–34.0)
MCH: 33 pg (ref 26.0–34.0)
MCHC: 33.8 g/dL (ref 30.0–36.0)
MCHC: 33.2 g/dL (ref 30.0–36.0)
MCV: 99.4 fL (ref 80.0–100.0)
MCV: 99.5 fL (ref 80.0–100.0)
Platelets: 386 10*3/uL (ref 150–400)
Platelets: 400 K/uL (ref 150–400)
RBC: 3.63 MIL/uL — ABNORMAL LOW (ref 3.87–5.11)
RBC: 3.73 MIL/uL — ABNORMAL LOW (ref 3.87–5.11)
RDW: 14 % (ref 11.5–15.5)
RDW: 14.4 % (ref 11.5–15.5)
WBC: 6.2 10*3/uL (ref 4.0–10.5)
WBC: 6.4 K/uL (ref 4.0–10.5)
nRBC: 0 % (ref 0.0–0.2)
nRBC: 0 % (ref 0.0–0.2)

## 2021-03-28 LAB — RESP PANEL BY RT-PCR (FLU A&B, COVID) ARPGX2
Influenza A by PCR: NEGATIVE
Influenza B by PCR: NEGATIVE
SARS Coronavirus 2 by RT PCR: NEGATIVE

## 2021-03-28 LAB — CREATININE, SERUM
Creatinine, Ser: 0.44 mg/dL (ref 0.44–1.00)
GFR, Estimated: >60 mL/min (ref >60)

## 2021-03-28 MED ORDER — ENOXAPARIN SODIUM 300 MG/3ML IJ SOLN
20.0000 mg | INTRAMUSCULAR | Status: DC
  Administered 2021-03-28 – 2021-03-31 (×4): 20 mg via SUBCUTANEOUS
  Filled 2021-03-28 (×5): qty 0.2

## 2021-03-28 MED ORDER — HYDRALAZINE HCL 20 MG/ML IJ SOLN: 10.0000 mg | INTRAMUSCULAR | Status: DC | PRN

## 2021-03-28 MED ORDER — MIRTAZAPINE 15 MG PO TABS
30.0000 mg | ORAL_TABLET | Freq: Every day | ORAL | Status: DC
  Administered 2021-03-28 – 2021-03-31 (×5): 30 mg via ORAL
  Filled 2021-03-28 (×2): qty 2
  Filled 2021-03-28: qty 1
  Filled 2021-03-28 (×2): qty 2

## 2021-03-28 MED ORDER — ENSURE ENLIVE PO LIQD
237.0000 mL | Freq: Three times a day (TID) | ORAL | Status: DC
  Administered 2021-03-28 – 2021-04-01 (×12): 237 mL via ORAL

## 2021-03-28 MED ORDER — ALPRAZOLAM 0.5 MG PO TABS
0.5000 mg | ORAL_TABLET | Freq: Every day | ORAL | Status: DC
  Administered 2021-03-28 – 2021-03-31 (×5): 0.5 mg via ORAL
  Filled 2021-03-28 (×4): qty 1
  Filled 2021-03-28: qty 2

## 2021-03-28 MED ORDER — SODIUM CHLORIDE 1 G PO TABS
1.0000 g | ORAL_TABLET | Freq: Every day | ORAL | Status: DC
  Administered 2021-03-28 – 2021-04-01 (×5): 1 g via ORAL
  Filled 2021-03-28 (×6): qty 1

## 2021-03-28 MED ORDER — ADULT MULTIVITAMIN W/MINERALS CH
1.0000 | ORAL_TABLET | Freq: Every day | ORAL | Status: DC
  Administered 2021-03-28 – 2021-04-01 (×5): 1 via ORAL
  Filled 2021-03-28 (×5): qty 1

## 2021-03-28 NOTE — Care Management Obs Status (Signed)
[facility] NOTIFICATION   Patient Details  Name: Holly Valenzuela MRN: [id number] Date of Birth: [birth year]   Medicare Observation Status Notification Given:  Yes    Tom-Johnson, Renea Ee, RN 03/28/2021, 2:35 PM

## 2021-03-28 NOTE — Progress Notes (Signed)
Patient ID: Holly Valenzuela, female   DOB: Feb 23, 1941, 80 y.o.   MRN: [id number] I have reviewed the patient's x-rays.  She has a nondisplaced left shoulder proximal humerus fracture.  Treatment for this type of injury is just wearing a sling and limiting left shoulder abduction and external rotation.  She should not try to reach overhead with that arm until further notice.  She can come in and out of the sling for hygiene purposes.  I can see her in the office in follow-up in 2 weeks.

## 2021-03-28 NOTE — ED Notes (Signed)
Pt to Xray.

## 2021-03-28 NOTE — Evaluation (Signed)
Occupational Therapy Evaluation Patient Details Name: Holly Valenzuela MRN: [id number] DOB: June 20, 1940 Today's Date: 03/28/2021   History of Present Illness Holly Valenzuela is a 80 y.o. female with medical history significant of colitis, arthritis, GERD, depression, hypertension, hyperlipidemia, hyponatremia, malnutrition, CKD, COPD, migraines presenting with lower extremity swelling   Clinical Impression   Patient admitted for the diagnosis above.  PTA she lives alone, admits to a history of falling, states she continues to drive, and only requires assistance with home cleaning.  Deficits impacting independence are listed below.  Currently she needs up to Mod A for balance support, up to Min A for basic transfers, and up to Mod A for lower body ADL due to poor stand balance.  She is a high fall risk, demonstrates poor insight to her deficits, and cannot come up with reasonable solutions for a safe discharge home.  She has limited assist, and currently needs up to 24 hour assist to ensure safety and to reduce her fall risk.  OT will follow in the acute setting, she would benefit from a ST Rehab stay to work on transfers, balance, and ADL compensatory techniques.  Unfortunately she is refusing post acute SNF.  [place] services would not be sufficient.         Recommendations for follow up therapy are one component of a multi-disciplinary discharge planning process, led by the attending physician.  Recommendations may be updated based on patient status, additional functional criteria and insurance authorization.   Follow Up Recommendations  Skilled nursing-short term rehab (<3 hours/day)    Assistance Recommended at Discharge    Functional Status Assessment  Patient has had a recent decline in their functional status and demonstrates the ability to make significant improvements in function in a reasonable and predictable amount of time.  Equipment Recommendations  BSC;Tub/shower seat     Recommendations for Other Services       Precautions / Restrictions Precautions Precautions: Fall Required Braces or Orthoses: Sling Restrictions Weight Bearing Restrictions: Yes LUE Weight Bearing: Non weight bearing      Mobility Bed Mobility Overal bed mobility: Needs Assistance Bed Mobility: Sit to Supine     Supine to sit: Min assist;HOB elevated Sit to supine: Min assist   General bed mobility comments: significantly increased time    Transfers Overall transfer level: Needs assistance Equipment used: Quad cane Transfers: Sit to/from [facility] Sit to Stand: Mod assist Stand pivot transfers: Mod assist         General transfer comment: very fearful of falling and unsteady.      Balance Overall balance assessment: Needs assistance Sitting-balance support: No upper extremity supported;Feet supported Sitting balance-Leahy Scale: Fair     Standing balance support: Single extremity supported Standing balance-Leahy Scale: Poor Standing balance comment: needs Cane and external support from therapist.                           ADL either performed or assessed with clinical judgement   ADL       Grooming: Wash/dry hands;Wash/dry face;Set up;Sitting       Lower Body Bathing: Moderate assistance;Sit to/from stand Lower Body Bathing Details (indicate cue type and reason): poor stand balance Upper Body Dressing : Sitting;Adhering to UE precautions;Cueing for safety;Cueing for sequencing   Lower Body Dressing: Moderate assistance;Sit to/from stand Lower Body Dressing Details (indicate cue type and reason): poor balance Toilet Transfer: Moderate assistance;BSC;Stand-pivot   Toileting- Clothing Manipulation and Hygiene:  Minimal assistance;Sitting/lateral lean               Vision Patient Visual Report: No change from baseline       Perception Perception Perception: Not tested   Praxis Praxis Praxis: Not tested     Pertinent Vitals/Pain Pain Assessment: Faces Pain Score: 5  Faces Pain Scale: Hurts even more Pain Location: L shoulder Pain Descriptors / Indicators: Aching;Sore Pain Intervention(s): Monitored during session     Hand Dominance Left   Extremity/Trunk Assessment Upper Extremity Assessment Upper Extremity Assessment: LUE deficits/detail RUE Deficits / Details: WFL ROM and 5/5 strength RUE Sensation: WNL RUE Coordination: WNL LUE Deficits / Details: LUE in sling 2/2 fx. LUE Sensation: WNL LUE Coordination: WNL   Lower Extremity Assessment Lower Extremity Assessment: Defer to PT evaluation   Cervical / Trunk Assessment Cervical / Trunk Assessment: Kyphotic   Communication Communication Communication: No difficulties   Cognition Arousal/Alertness: Awake/alert Behavior During Therapy: Restless;Anxious Overall Cognitive Status: Impaired/Different from baseline Area of Impairment: Safety/judgement;Awareness                         Safety/Judgement: Decreased awareness of safety;Decreased awareness of deficits Awareness: Emergent   General Comments: Patient currenlty having difficulty processing her injuries, and cannot formulate reasonable solutions.     General Comments  VSS on RA, incontinent of urine during session    Exercises     Shoulder Instructions      Home Living Family/patient expects to be discharged to:: Private residence Living Arrangements: Alone Available Help at Discharge: Personal care attendant;Neighbor;Available PRN/intermittently Type of Home: Other(Comment) Home Access: Stairs to enter Entrance Stairs-Number of Steps: 2-3 Entrance Stairs-Rails: Right Home Layout: One level     Bathroom Shower/Tub: Occupational psychologist: Handicapped height     Home Equipment: Conservation officer, nature (2 wheels);Cane - single point;Shower seat;Hand held shower head   Additional Comments: pt reports having a PCA once a week to assist with  cleaning the home.      Prior Functioning/Environment Prior Level of Function : Needs assist       Physical Assist : Mobility (physical);ADLs (physical) Mobility (physical): Transfers;Gait ADLs (physical): Bathing;Dressing;Toileting Mobility Comments: pt ambulated independently without device at baseline          OT Problem List: Decreased strength;Decreased range of motion;Impaired UE functional use;Pain;Impaired balance (sitting and/or standing);Decreased safety awareness;Decreased knowledge of precautions      OT Treatment/Interventions: Self-care/ADL training;Therapeutic exercise;Therapeutic activities;Balance training;Patient/family education    OT Goals(Current goals can be found in the care plan section) Acute Rehab OT Goals Patient Stated Goal: Go home OT Goal Formulation: With patient Time For Goal Achievement: 04/11/21 Potential to Achieve Goals: Fair ADL Goals Pt Will Perform Grooming: with min guard assist;standing Pt Will Perform Lower Body Bathing: with min guard assist;sit to/from stand Pt Will Perform Upper Body Dressing: with set-up;standing Pt Will Perform Lower Body Dressing: with min guard assist;sit to/from stand Pt Will Transfer to Toilet: with min guard assist;ambulating;regular height toilet Pt Will Perform Toileting - Clothing Manipulation and hygiene: with min assist;sit to/from stand  OT Frequency: Min 2X/week   Barriers to D/C: Decreased caregiver support          Co-evaluation              AM-PAC OT "6 Clicks" Daily Activity     Outcome Measure Help from another person eating meals?: None Help from another person taking care of personal grooming?: A Little  Help from another person toileting, which includes using toliet, bedpan, or urinal?: A Lot Help from another person bathing (including washing, rinsing, drying)?: A Lot Help from another person to put on and taking off regular upper body clothing?: A Little Help from another person  to put on and taking off regular lower body clothing?: A Lot 6 Click Score: 16   End of Session Equipment Utilized During Treatment: Gait belt Nurse Communication: Mobility status  Activity Tolerance: Patient tolerated treatment well Patient left: in bed;with call bell/phone within reach;with bed alarm set  OT Visit Diagnosis: Unsteadiness on feet (R26.81);Other abnormalities of gait and mobility (R26.89);Muscle weakness (generalized) (M62.81);Pain;History of falling (Z91.81) Pain - Right/Left: Left Pain - part of body: Shoulder                Time: 1710-1735 OT Time Calculation (min): 25 min Charges:  OT General Charges $OT Visit: 1 Visit OT Evaluation $OT Eval Moderate Complexity: 1 Mod OT Treatments $Self Care/Home Management : 8-22 mins  03/28/2021  RP, OTR/L  Acute Rehabilitation Services  Office:  [phone]   Holly Valenzuela 03/28/2021, 5:43 PM

## 2021-03-28 NOTE — Evaluation (Signed)
Physical Therapy Evaluation Patient Details Name: Holly Valenzuela MRN: [id number] DOB: May 27, 1941 Today's Date: 03/28/2021  History of Present Illness  Holly Valenzuela is a 80 y.o. female with medical history significant of colitis, arthritis, GERD, depression, hypertension, hyperlipidemia, hyponatremia, malnutrition, CKD, COPD, migraines presenting with lower extremity swelling  Clinical Impression  Pt presents to PT with deficits in functional mobility, gait, balance, power, strength, endurance, pain, and cognition. Pt currently benefits from physical assistance to transfer due to imbalance. Pt becomes flustered during session by need for sling use and maintenance of NWB to LUE. Pt is adamant that she needs to use a walker with both hands to mobilize at this time, and is unable to attempt ambulation with a cane or crutch. PT provides education on why NWB and sling need to be maintained currently to allow for healing of L humerus. Pt seems to have impaired awareness of how a lack of consistent caregiver support would placer her at a high risk of falls and further injury at this time. PT will continue to attempt to provide transfer and gait training. At this time pt would need to be able to transfer and ambulate at a modI level and stand without UE support to perform ADLs with RUE in order to return safely with the caregiver support she has identified at this time. PT recommends SNF placement currently due to insufficient caregiver support and impaired balance/gait.     Recommendations for follow up therapy are one component of a multi-disciplinary discharge planning process, led by the attending physician.  Recommendations may be updated based on patient status, additional functional criteria and insurance authorization.  Follow Up Recommendations Skilled nursing-short term rehab (<3 hours/day)    Assistance Recommended at Discharge Intermittent Supervision/Assistance  Functional Status  Assessment Patient has had a recent decline in their functional status and demonstrates the ability to make significant improvements in function in a reasonable and predictable amount of time.  Equipment Recommendations   (TBD)    Recommendations for Other Services       Precautions / Restrictions Precautions Precautions: Fall Required Braces or Orthoses: Sling Restrictions Weight Bearing Restrictions: Yes LUE Weight Bearing: Non weight bearing      Mobility  Bed Mobility Overal bed mobility: Needs Assistance Bed Mobility: Supine to Sit     Supine to sit: Min assist;HOB elevated     General bed mobility comments: significantly increased time    Transfers Overall transfer level: Needs assistance Equipment used: Crutches;Straight cane;1 person hand held assist Transfers: Sit to/from [facility] Sit to Stand: Min assist Stand pivot transfers: Min assist         General transfer comment: pt with posterior loss of balance with most standing attempts, requires PT cues for hand placement, trunk flexion, and rocking for anterior momentum. Pt with instability when pivoting to recliner and inability to control descent when sitting    Ambulation/Gait Ambulation/Gait assistance:  (pt flustered during session, declines to attempt ambulation with crutch. Reports only wanting to ambulate with both arms on walker despite WB restrictions)              Stairs            Wheelchair Mobility    Modified Rankin (Stroke Patients Only)       Balance Overall balance assessment: Needs assistance Sitting-balance support: No upper extremity supported;Feet supported Sitting balance-Leahy Scale: Fair     Standing balance support: Single extremity supported Standing balance-Leahy Scale: Poor Standing balance comment:  reliant on minA from PT                             Pertinent Vitals/Pain Pain Assessment: 0-10 Pain Score: 5  Pain Location:  BLE Pain Descriptors / Indicators: Aching Pain Intervention(s): Monitored during session    Home Living Family/patient expects to be discharged to:: Private residence Living Arrangements: Alone Available Help at Discharge: Personal care attendant;Neighbor;Available PRN/intermittently Type of Home: Other(Comment) (townhome)         Home Layout: One level Home Equipment: Conservation officer, nature (2 wheels);Cane - single point;Shower seat;Hand held shower head Additional Comments: pt reports having a PCA once a week to assist with cleaning the home.    Prior Function Prior Level of Function : Needs assist       Physical Assist : ADLs (physical)   ADLs (physical): IADLs (cleaning home) Mobility Comments: pt ambulated independently without device at baseline       Hand Dominance   Dominant Hand: Left    Extremity/Trunk Assessment   Upper Extremity Assessment Upper Extremity Assessment: LUE deficits/detail LUE Deficits / Details: LUE in sling 2/2 fx. PT encourages no movement of L shoulder    Lower Extremity Assessment Lower Extremity Assessment: Generalized weakness    Cervical / Trunk Assessment Cervical / Trunk Assessment: Kyphotic  Communication   Communication: No difficulties  Cognition Arousal/Alertness: Awake/alert Behavior During Therapy: Anxious Overall Cognitive Status: Impaired/Different from baseline Area of Impairment: Safety/judgement;Awareness                         Safety/Judgement: Decreased awareness of safety;Decreased awareness of deficits Awareness: Emergent            General Comments General comments (skin integrity, edema, etc.): VSS on RA, incontinent of urine during session    Exercises     Assessment/Plan    PT Assessment Patient needs continued PT services  PT Problem List Decreased strength;Decreased activity tolerance;Decreased balance;Decreased mobility;Decreased cognition;Decreased knowledge of use of DME;Decreased  safety awareness;Decreased knowledge of precautions;Pain       PT Treatment Interventions DME instruction;Gait training;Functional mobility training;Therapeutic activities;Therapeutic exercise;Balance training;Neuromuscular re-education;Patient/family education;Wheelchair mobility training;Cognitive remediation    PT Goals (Current goals can be found in the Care Plan section)  Acute Rehab PT Goals Patient Stated Goal: to go home PT Goal Formulation: With patient Time For Goal Achievement: 04/11/21 Potential to Achieve Goals: Fair    Frequency Min 3X/week (for possible progression)   Barriers to discharge Decreased caregiver support      Co-evaluation               AM-PAC PT "6 Clicks" Mobility  Outcome Measure Help needed turning from your back to your side while in a flat bed without using bedrails?: A Little Help needed moving from lying on your back to sitting on the side of a flat bed without using bedrails?: A Little Help needed moving to and from a bed to a chair (including a wheelchair)?: A Little Help needed standing up from a chair using your arms (e.g., wheelchair or bedside chair)?: A Little Help needed to walk in hospital room?: Total Help needed climbing 3-5 steps with a railing? : Total 6 Click Score: 14    End of Session   Activity Tolerance: Patient limited by pain Patient left: in chair;with call bell/phone within reach;with chair alarm set Nurse Communication: Mobility status PT Visit Diagnosis: Unsteadiness on feet (R26.81);Other abnormalities  of gait and mobility (R26.89);Muscle weakness (generalized) (M62.81);Pain Pain - Right/Left: Left Pain - part of body: Shoulder    Time: 6659-9357 PT Time Calculation (min) (ACUTE ONLY): 42 min   Charges:   PT Evaluation $PT Eval Low Complexity: 1 Low PT Treatments $Therapeutic Activity: 8-22 mins        Zenaida Niece, PT, DPT Acute Rehabilitation Pager: [phone] Office [phone]   Zenaida Niece 03/28/2021, 4:07 PM

## 2021-03-28 NOTE — Plan of Care (Signed)

## 2021-03-28 NOTE — TOC Initial Note (Signed)
Transition of Care [facility]) - Initial/Assessment Note    Patient Details  Name: Holly Valenzuela MRN: [id number] Date of Birth: [birth year]  Transition [facility]) CM/SW Contact:    Tom-Johnson, Renea Ee, RN Phone Number: 03/28/2021, 2:48 PM  Clinical Narrative:                 CM spoke with patient at bedside about needs for post hospital transition. Presented with falls and displaced fractured left shoulder. Patient states she lives alone and her daughter came to visit from [place]. Patient has three children and two grand children. Retired from [employer]. Has a cane and walker at home. Independent with care and drives self prior to her fall and hospitalization. PCP is Carleene Cooper [facility] [address]. Has [employer] Medicare. Currently active with [facility] for PT services. CM called and spoke with Erline Levine [phone]) and verify that patient is active with their services. PT recommended SNF. Patient stated to CM that she is going home at discharge and do not want to go to any rehab. Her daughter will be there to assist her. CM encouraged patient that it will be best if patient goes to rehab since her daughter will not be around for long and with her fractured shoulder, she will need help. Patient states her daughter will be around to assist with her care. CM made several calls and left secured voice mail message for daughter to call about discharge disposition but got no response. CM called and spoke with Lily Kocher, listed as contact and he states he is at work and does not know where daughter is. CM notified MD. CM will continue to follow with needs.      Expected Discharge Plan: Home/Self Care Barriers to Discharge: Continued Medical Work up   Patient Goals and CMS Choice Patient states their goals for this hospitalization and ongoing recovery are:: To go home CMS Medicare.gov Compare Post Acute Care list provided to::  Patient Choice offered to / list presented to : Patient  Expected Discharge Plan and Services Expected Discharge Plan: Home/Self Care   Discharge Planning Services: CM Consult   Living arrangements for the past 2 months: Single Family Home Expected Discharge Date: 03/28/21               DME Arranged: N/A DME Agency: NA       HH Arranged: RN, PT, Nurse's Aide, Social Work [employer] Agency: [place]: [place] Representative spoke with at [place]- To verify if patient is active.  Prior Living Arrangements/Services Living arrangements for the past 2 months: [place] with:: Self (Daughter in from [place].) Patient language and need for interpreter reviewed:: Yes Do you feel safe going back to the place where you live?: Yes      Need for Family Participation in Patient Care: Yes (Comment) Care giver support system in place?: Yes (comment) Current home services: DME (Cane, walker) Criminal Activity/Legal Involvement Pertinent to Current Situation/Hospitalization: No - Comment as needed  Activities of Daily Living Home Assistive Devices/Equipment: None ADL Screening (condition at time of admission) Patient's cognitive ability adequate to safely complete daily activities?: Yes Is the patient deaf or have difficulty hearing?: No Does the patient have difficulty seeing, even when wearing glasses/contacts?: Yes Does the patient have difficulty concentrating, remembering, or making decisions?: Yes Patient able to express need for assistance with ADLs?: Yes Does the patient have difficulty dressing  or bathing?: Yes Independently performs ADLs?: Yes (appropriate for developmental age) Communication: Independent Dressing (OT): Independent Grooming: Independent Feeding: Independent Bathing: Independent Is this a change from baseline?: Pre-admission baseline Toileting: Needs assistance Is this a change from baseline?: Change  from baseline, expected to last <3 days In/Out Bed: Needs assistance Is this a change from baseline?: Change from baseline, expected to last <3 days Walks in Home: Independent Does the patient have difficulty walking or climbing stairs?: Yes Weakness of Legs: Both Weakness of Arms/Hands: Left (L shoulder fracture)  Permission Sought/Granted Permission sought to share information with : Case Manager, Family Supports Permission granted to share information with : Yes, Verbal Permission Granted              Emotional Assessment Appearance:: Appears stated age Attitude/Demeanor/Rapport: Engaged, Self-Confident Affect (typically observed): Accepting, Appropriate, Calm, Hopeful Orientation: : Oriented to Self, Oriented to Place, Oriented to  Time, Oriented to Situation Alcohol / Substance Use: Not Applicable Psych Involvement: No (comment)  Admission diagnosis:  Weakness generalized [R53.1] Weakness [R53.1] Pain [R52] Patient Active Problem List   Diagnosis Date Noted   Weakness generalized 03/28/2021   Edema 03/19/2021   Colitis 03/22/2020   Syncope 02/28/2020   Essential hypertension 02/28/2020   Hyponatremia    Pain    Weakness    Protein-calorie malnutrition, severe ([place]) 04/19/2014   Sacral back pain    Failure to thrive in adult 04/18/2014   Tachycardia 04/18/2014   Vitamin D deficiency 04/18/2014   Hyperlipidemia 04/18/2014   Tobacco abuse 04/18/2014   Fall 04/17/2014   Sacral fracture, closed ([place])    Pure hypercholesterolemia 01/16/2014   Esophageal reflux 01/16/2014   Other B-complex deficiencies 01/16/2014   Arthritis of right hip 01/12/2014   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 [place] [address] Phone: [phone] Fax: [phone]  [place] [place] [address] Phone: [phone] Fax: [phone]     Social Determinants of Health (SDOH) Interventions    Readmission Risk Interventions Readmission Risk Prevention Plan 03/01/2020  Post Dischage Appt Not Complete  Appt Comments rec for SNF  Medication Screening Complete  Transportation Screening Complete  Some recent data might be hidden

## 2021-03-28 NOTE — Progress Notes (Signed)
Pt arrived to unit via stretcher, alert, orientated, and cooperative, short term memory appears to be impaired, pt able to ambulate from stretcher to [facility] x 1 assist, pt voided without difficulty, skin is intact, protective mepilex applied to sacrum, #20 R FA PIV patent, intact, pt educated on call bell use, unit safety protocols, and snack offered, bed in low position, no signs of distress, call bell in reach

## 2021-03-28 NOTE — ED Notes (Signed)
Meds sent to pharmacy.

## 2021-03-28 NOTE — Progress Notes (Signed)
Initial Nutrition Assessment  DOCUMENTATION CODES:  Severe malnutrition in context of social or environmental circumstances, Underweight  INTERVENTION:  Add Ensure Plus High Protein po TID, each supplement provides 350 kcal and 20 grams of protein.   Add Magic cup TID with meals, each supplement provides 290 kcal and 9 grams of protein.  Add MVI with minerals daily.  Encourage PO intake.  NUTRITION DIAGNOSIS:  Severe Malnutrition related to social / environmental circumstances (depression) as evidenced by severe fat depletion, severe muscle depletion.  GOAL:  Patient will meet greater than or equal to 90% of their needs  MONITOR:  PO intake, Supplement acceptance, Labs, Weight trends, I & O's  REASON FOR ASSESSMENT:  Malnutrition Screening Tool    ASSESSMENT:  80 yo female with a PMH of colitis, arthritis, GERD, depression, hypertension, hyperlipidemia, hyponatremia, malnutrition, CKD, COPD, migraines presenting with weakness.  Spoke with pt alongside case Freight forwarder. Pt seemed very depressed, asking to be left alone.  Pt able to tell RD she eats whatever she wants at home and enjoys food. She recently has been receiving Meals on Wheels deliveries to her home.  Per Epic, pt ate 0% of breakfast this morning.  Pt's weight has remained consistent for the past 7 years or so. Pt also denies any changes in her weight.  Recommend adding Ensure TID, Magic Cup TID, and MVI with minerals daily.  Medications: reviewed; Xanax, Remeron, NaCl tablet  Labs: reviewed; Na 129 (L)  NUTRITION - FOCUSED PHYSICAL EXAM: Flowsheet Row Most Recent Value  Orbital Region Moderate depletion  Upper Arm Region Severe depletion  Thoracic and Lumbar Region Severe depletion  Buccal Region Severe depletion  Temple Region Severe depletion  Clavicle Bone Region Severe depletion  Clavicle and Acromion Bone Region Severe depletion  Scapular Bone Region Severe depletion  Dorsal Hand Severe depletion   Patellar Region Severe depletion  Anterior Thigh Region Severe depletion  Posterior Calf Region Severe depletion  Edema (RD Assessment) Moderate  [RLE]  Hair Reviewed  Eyes Reviewed  Mouth Reviewed  Skin Reviewed  Nails Reviewed   Diet Order:   Diet Order             Diet Heart Room service appropriate? Yes; Fluid consistency: Thin  Diet effective now                  EDUCATION NEEDS:  Education needs have been addressed  Skin:  Skin Assessment: Reviewed RN Assessment  Last BM:  03/27/21  Height:  Ht Readings from Last 1 Encounters:  03/28/21 5\' 5"  (1.651 m)   Weight:  Wt Readings from Last 1 Encounters:  03/28/21 39.8 kg   BMI:  Body mass index is 14.6 kg/m.  Estimated Nutritional Needs:  Kcal:  1250-1450 Protein:  65-80 grams Fluid:  >1.2 L  Derrel Nip, RD, LDN (she/her/hers) Registered Dietitian I Pager #: [phone] After-Hours/Weekend Pager # in [place]

## 2021-03-28 NOTE — Progress Notes (Signed)
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  Patient has not been eating and drinking well over the last few days.  She has been feeling weak and apparently fell therefore was brought to the hospital.  Upon admission trauma work-up including CT head and cervical spine were negative, sodium level was 132.  COVID test was negative.  Left shoulder x-ray showed mildly displaced fracture.  Orthopedic team was called, Dr Ninfa Linden, who recommended placing a sling in place and follow-up in 2 weeks.  Orthopedic PA help make arrangements for this.  Patient will be seen by physical therapy today.  Patient denies any other complaints to me this morning, overall feels okay besides left shoulder pain and tells me that is her dominant hand.  Her vital signs are overall stable.  On physical exam she has limited range of motion of her left upper extremity.  Otherwise clear to auscultation bilaterally, normal sinus rhythm.  Abdomen is nontender nondistended.  Due to fall and generalized weakness-PT/OT is currently pending Left shoulder nondisplaced fracture-recommending shoulder sling with outpatient follow-up in 2 weeks with Dr. Ninfa Linden.  Discussed with orthopedic Essential hypertension-blood pressure medications are on hold Anxiety and depression/continue home meds Chronic hyponatremia-Baseline sodium around 130.   Gerlean Ren MD [facility]

## 2021-03-28 NOTE — H&P (Addendum)
History and Physical    Holly Valenzuela:[id number] DOB: 1940-08-12 DOA: 03/27/2021  PCP: Crist Infante, MD  Patient coming from: Home.  Chief Complaint: Weakness.  HPI: Holly Valenzuela is a 80 y.o. 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  Patient as per report has not been eating well for the last couple of days because patient was finding it difficult to walk.  Patient also about 3 days ago had a fall and since the fall has been hurting her left shoulder.  Denies hitting her head or losing consciousness.  Patient states her shoes may have got caught into the concrete slabs and fell.  ED Course: In the ER patient appears nonfocal but finds it difficult to walk without help and has had a wobbling type of gait.  CT head C-spine were unremarkable x-ray of the chest and pelvis was unremarkable.  Labs show sodium 130 which is at around the time of discharge last week it was 130.  Also shows macrocytosis with B12 folate levels done last week was normal.  COVID test is pending.  Patient admitted for generalized weakness.  Review of Systems: As per HPI, rest all negative.   Past Medical History:  Diagnosis Date   Arthritis    Basal cell adenocarcinoma    right hand   Complication of anesthesia    SEVERE nausea vomiting   COPD (chronic obstructive pulmonary disease) (HCC)    Depression    no per pt 02-27-16   DJD (degenerative joint disease)    Endometrial cancer (HCC)    Essential hypertension    Genital warts    Headache(784.0)    HX MIGRAINES   HLD (hyperlipidemia)    Hyperlipidemia    Hypertension    Migraine headache    Occular    Migraines    Osteoporosis hypetens   Recurrent UTI    Tobacco abuse    Vitamin B12 deficiency    Vitamin D deficiency     Past Surgical  History:  Procedure Laterality Date   ABDOMINAL HYSTERECTOMY     APPENDECTOMY     CARPAL TUNNEL RELEASE     CESAREAN SECTION     COLONOSCOPY     KYPHOPLASTY     THYROID SURGERY     nodule removal   TOTAL HIP ARTHROPLASTY Right 01/12/2014   Procedure: RIGHT TOTAL HIP ARTHROPLASTY ANTERIOR APPROACH;  Surgeon: Mcarthur Rossetti, MD;  Location: [facility];  Service: Orthopedics;  Laterality: Right;   VERTEBROPLASTY       reports that she has been smoking cigarettes. She has a 25.00 pack-year smoking history. She has never used smokeless tobacco. She reports that she does not drink alcohol and does not use drugs.  Allergies  Allergen Reactions   Adhesive [Tape] Itching   Codeine Nausea And Vomiting    Other reaction(s): Unknown   Hydrocodone-Acetaminophen     Other reaction(s): Unknown    Family History  Problem Relation Age of Onset   Alzheimer's disease Mother    Heart disease Mother    Stroke Father    Colon cancer Neg Hx    Esophageal cancer Neg Hx    Stomach cancer Neg Hx    Rectal cancer Neg Hx     Prior to Admission medications   Medication Sig Start Date End Date  Taking? Authorizing Provider  ALPRAZolam Duanne Moron) 0.5 MG tablet Take 0.5-1 tablets (0.25-0.5 mg total) by mouth 3 (three) times daily as needed for anxiety. Patient taking differently: Take 0.5 mg by mouth at bedtime. 03/04/20   Geradine Girt, DO  ascorbic acid (VITAMIN C) 500 MG tablet Take 500-1,000 mg by mouth daily.    [provider]  atorvastatin (LIPITOR) 10 MG tablet Take 10 mg by mouth daily.    [provider]  butalbital-acetaminophen-caffeine (FIORICET) 50-325-40 MG tablet Take 0.5-1 tablets by mouth 4 (four) times daily as needed for migraine.  02/11/20   [provider]  cyanocobalamin (,VITAMIN B-12,) 1000 MCG/ML injection Inject 1,000 mcg into the muscle every 30 (thirty) days.     [provider]  famotidine (PEPCID) 20 MG tablet Take 1 tablet (20 mg total)  by mouth daily as needed for heartburn or indigestion. 03/21/21   Elodia Florence., MD  feeding supplement, ENSURE COMPLETE, (ENSURE COMPLETE) LIQD Take 237 mLs by mouth daily as needed (for supplementation). Patient not taking: No sig reported 03/25/20   Cherylann Ratel A, DO  fluticasone (FLONASE) 50 MCG/ACT nasal spray Place 2 sprays into both nostrils daily as needed for allergies or rhinitis.     [provider]  meclizine (ANTIVERT) 12.5 MG tablet Take 1-2 tablets (12.5-25 mg total) by mouth every 6 (six) hours as needed for dizziness. Patient not taking: No sig reported 03/25/20   Cherylann Ratel A, DO  mirtazapine (REMERON) 30 MG tablet Take 30 mg by mouth at bedtime.    [provider]  Multiple Vitamin (MULTIVITAMIN WITH MINERALS) TABS tablet Take 1 tablet by mouth daily. Patient not taking: No sig reported 03/05/20   Geradine Girt, DO  Multiple Vitamins-Minerals (EYE-VITE PLUS LUTEIN PO) Take 1 capsule by mouth 3 (three) times a week. Patient not taking: No sig reported    [provider]  pantoprazole (PROTONIX) 40 MG tablet Take 40 mg by mouth daily as needed (heartburn).    [provider]  sodium chloride 1 g tablet Take 1 tablet (1 g total) by mouth daily. Follow up with Dr. [name] for repeat labs within a week 03/21/21 04/20/21  Elodia Florence., MD    Physical Exam: Constitutional: Moderately built and nourished. Vitals:   03/27/21 1436  BP: 132/88  Pulse: 71  Resp: 18  Temp: 97.7 F (36.5 C)  TempSrc: Oral  SpO2: 99%   Eyes: Anicteric no pallor. ENMT: No discharge from the ears eyes nose and mouth. Neck: No mass felt.  No neck rigidity. Respiratory: No rhonchi or crepitations. Cardiovascular: S1-S2 heard. Abdomen: Soft nontender bowel sound present. Musculoskeletal: Mild swelling of the ankles. Skin: No rash. Neurologic: Alert awake oriented to time place and person.  Moves all extremities. Psychiatric: Appears normal.   Normal affect.   Labs on Admission: I have personally reviewed following labs and imaging studies  CBC: Recent Labs  Lab 03/21/21 0529 03/27/21 1525  WBC 7.8 6.0  NEUTROABS 4.9 2.8  HGB 11.7* 13.0  HCT 34.6* 39.2  MCV 100.3* 101.3*  PLT 327 203*   Basic Metabolic Panel: Recent Labs  Lab 03/21/21 0529 03/27/21 1525  NA 130* 130*  K 4.0 4.0  CL 96* 95*  CO2 27 25  GLUCOSE 92 85  BUN 12 12  CREATININE 0.55 0.63  CALCIUM 8.2* 9.0  MG 2.0  --   PHOS 3.3  --    GFR: Estimated Creatinine Clearance: 35 mL/min (by  C-G formula based on SCr of 0.63 mg/dL). Liver Function Tests: Recent Labs  Lab 03/21/21 0529 03/27/21 1525  AST 18 21  ALT 11 15  ALKPHOS 99 105  BILITOT 0.2* 0.2*  PROT 5.2* 6.2*  ALBUMIN 2.7* 3.5   No results for input(s): LIPASE, AMYLASE in the last 168 hours. No results for input(s): AMMONIA in the last 168 hours. Coagulation Profile: No results for input(s): INR, PROTIME in the last 168 hours. Cardiac Enzymes: Recent Labs  Lab 03/27/21 1525  CKTOTAL 56   BNP (last 3 results) No results for input(s): PROBNP in the last 8760 hours. HbA1C: No results for input(s): HGBA1C in the last 72 hours. CBG: No results for input(s): GLUCAP in the last 168 hours. Lipid Profile: No results for input(s): CHOL, HDL, LDLCALC, TRIG, CHOLHDL, LDLDIRECT in the last 72 hours. Thyroid Function Tests: No results for input(s): TSH, T4TOTAL, FREET4, T3FREE, THYROIDAB in the last 72 hours. Anemia Panel: No results for input(s): VITAMINB12, FOLATE, FERRITIN, TIBC, IRON, RETICCTPCT in the last 72 hours. Urine analysis:    Component Value Date/Time   COLORURINE STRAW (A) 03/27/2021 1513   APPEARANCEUR CLEAR 03/27/2021 1513   LABSPEC 1.008 03/27/2021 1513   PHURINE 7.0 03/27/2021 1513   GLUCOSEU NEGATIVE 03/27/2021 1513   HGBUR NEGATIVE 03/27/2021 1513   BILIRUBINUR NEGATIVE 03/27/2021 1513   KETONESUR NEGATIVE 03/27/2021 1513   PROTEINUR NEGATIVE 03/27/2021  1513   UROBILINOGEN 0.2 04/19/2014 1253   NITRITE NEGATIVE 03/27/2021 1513   LEUKOCYTESUR NEGATIVE 03/27/2021 1513   Sepsis Labs: @LABRCNTIP (procalcitonin:4,lacticidven:4) ) Recent Results (from the past 240 hour(s))  Resp Panel by RT-PCR (Flu A&B, Covid) Nasopharyngeal Swab     Status: None   Collection Time: 03/19/21  8:21 PM   Specimen: Nasopharyngeal Swab; Nasopharyngeal(NP) swabs in vial transport medium  Result Value Ref Range Status   SARS Coronavirus 2 by RT PCR NEGATIVE NEGATIVE Final    Comment: (NOTE) SARS-CoV-2 target nucleic acids are NOT DETECTED.  The SARS-CoV-2 RNA is generally detectable in upper respiratory specimens during the acute phase of infection. The lowest concentration of SARS-CoV-2 viral copies this assay can detect is 138 copies/mL. A negative result does not preclude SARS-Cov-2 infection and should not be used as the sole basis for treatment or other patient management decisions. A negative result may occur with  improper specimen collection/handling, submission of specimen other than nasopharyngeal swab, presence of viral mutation(s) within the areas targeted by this assay, and inadequate number of viral copies(<138 copies/mL). A negative result must be combined with clinical observations, patient history, and epidemiological information. The expected result is Negative.  Fact Sheet for Patients:  EntrepreneurPulse.com.au  Fact Sheet for Healthcare Providers:  IncredibleEmployment.be  This test is no t yet approved or cleared by the [place] FDA and  has been authorized for detection and/or diagnosis of SARS-CoV-2 by FDA under an Emergency Use Authorization (EUA). This EUA will remain  in effect (meaning this test can be used) for the duration of the COVID-19 declaration under Section 564(b)(1) of the Act, 21 U.S.C.section 360bbb-3(b)(1), unless the authorization is terminated  or revoked sooner.        Influenza A by PCR NEGATIVE NEGATIVE Final   Influenza B by PCR NEGATIVE NEGATIVE Final    Comment: (NOTE) The Xpert Xpress SARS-CoV-2/FLU/RSV plus assay is intended as an aid in the diagnosis of influenza from Nasopharyngeal swab specimens and should not be used as a sole basis for treatment. Nasal washings and aspirates are unacceptable for  Xpert Xpress SARS-CoV-2/FLU/RSV testing.  Fact Sheet for Patients: EntrepreneurPulse.com.au  Fact Sheet for Healthcare Providers: IncredibleEmployment.be  This test is not yet approved or cleared by the [place] FDA and has been authorized for detection and/or diagnosis of SARS-CoV-2 by FDA under an Emergency Use Authorization (EUA). This EUA will remain in effect (meaning this test can be used) for the duration of the COVID-19 declaration under Section 564(b)(1) of the Act, 21 U.S.C. section 360bbb-3(b)(1), unless the authorization is terminated or revoked.  Performed at [facility], [place] [address], [place], [place] [address]      Radiological Exams on Admission: DG Chest 2 View  Result Date: 03/27/2021 CLINICAL DATA:  Fall EXAM: CHEST - 2 VIEW COMPARISON:  Chest radiograph 03/19/2021 FINDINGS: The cardiomediastinal silhouette is within normal limits. The lungs hyperinflated again suggesting COPD. There is no focal consolidation or pulmonary edema. There is no pleural effusion or pneumothorax. There is no acute osseous abnormality. IMPRESSION: Stable chest with no radiographic evidence of acute cardiopulmonary process. Electronically Signed   By: Valetta Mole M.D.   On: 03/27/2021 15:56   DG Pelvis 1-2 Views  Result Date: 03/27/2021 CLINICAL DATA:  Fall. EXAM: PELVIS - 1-2 VIEW COMPARISON:  Pelvis x-ray 01/12/2014. 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 The bones are osteopenic. There is no acute fracture or dislocation. Left hip joint space is well  maintained. Sacroplasty changes noted bilaterally. Soft tissues are within normal limits. IMPRESSION: 1. No acute fracture or dislocation. Electronically Signed   By: Ronney Asters M.D.   On: 03/27/2021 16:03   CT Head Wo Contrast  Result Date: 03/27/2021 CLINICAL DATA:  Facial trauma EXAM: CT HEAD WITHOUT CONTRAST TECHNIQUE: Contiguous axial images were obtained from the base of the skull through the vertex without intravenous contrast. COMPARISON:  03/22/2020 FINDINGS: Brain: 6 There is atrophy and chronic small vessel disease changes. No acute intracranial abnormality. Specifically, no hemorrhage, hydrocephalus, mass lesion, acute infarction, or significant intracranial injury. Vascular: No hyperdense vessel or unexpected calcification. Skull: No acute calvarial abnormality. Sinuses/Orbits: No acute findings Other: None IMPRESSION: Atrophy, chronic microvascular disease. No acute intracranial abnormality. Electronically Signed   By: Rolm Baptise M.D.   On: 03/27/2021 22:40   CT Cervical Spine Wo Contrast  Result Date: 03/27/2021 CLINICAL DATA:  Neck trauma, mechanically unstable spine (Age >= 16y) EXAM: CT CERVICAL SPINE WITHOUT CONTRAST TECHNIQUE: Multidetector CT imaging of the cervical spine was performed without intravenous contrast. Multiplanar CT image reconstructions were also generated. COMPARISON:  None. FINDINGS: Alignment: No subluxation. Skull base and vertebrae: No acute fracture. No primary bone lesion or focal pathologic process. Soft tissues and spinal canal: No prevertebral fluid or swelling. No visible canal hematoma. Disc levels: Degenerative disc disease most pronounced from C4-5 through C6-7. Bilateral degenerative facet disease. Upper chest: No acute findings Other: None IMPRESSION: Degenerative disc and facet disease.  No acute bony abnormality. Electronically Signed   By: Rolm Baptise M.D.   On: 03/27/2021 22:41      Assessment/Plan Principal Problem:   Weakness Active  Problems:   Essential hypertension   Hyponatremia   Weakness generalized    Generalized weakness likely from deconditioning.  We will get physical therapy consult and closely monitor.  Check orthostatics. History of hypertension presently off medication since last admission.  We will keep patient n.p.o. and IV hydralazine follow blood pressure trends. Hyponatremia secondary to SIADH sodium is stable at this time around 130.  Closely monitor metabolic panel. History of  anxiety and depression on Remeron and Xanax. Macrocytosis with B12 and folate level being normal which were checked last week. Fall with left shoulder pain x-rays are pending.  Physical therapy consult.  COVID test pending.   DVT prophylaxis: Lovenox. Code Status: Full code. Family Communication: We will need to discuss with family. Disposition Plan: May need rehab. Consults called: Physical therapy. Admission status: Observation.   Rise Patience MD [facility] Pager [phone].  If 7PM-7AM, please contact night-coverage [email or web address] Password [email or web address]  03/28/2021, 12:11 AM

## 2021-03-29 DIAGNOSIS — Z008 Encounter for other general examination: Secondary | ICD-10-CM

## 2021-03-29 DIAGNOSIS — R531 Weakness: Secondary | ICD-10-CM | POA: Diagnosis not present

## 2021-03-29 MED ORDER — ALPRAZOLAM ER 0.5 MG PO TB24: 0.5000 mg | ORAL_TABLET | Freq: Two times a day (BID) | ORAL | Status: DC | PRN

## 2021-03-29 MED ORDER — ALPRAZOLAM 0.5 MG PO TABS
0.5000 mg | ORAL_TABLET | Freq: Two times a day (BID) | ORAL | Status: DC | PRN
  Administered 2021-03-29 – 2021-04-01 (×6): 0.5 mg via ORAL
  Filled 2021-03-29 (×7): qty 1

## 2021-03-29 NOTE — Progress Notes (Signed)
Physical Therapy Treatment Patient Details Name: Holly Valenzuela MRN: [id number] DOB: 30-May-1941 Today's Date: 03/29/2021   History of Present Illness Holly Valenzuela is a 80 y.o. female with medical history significant of colitis, arthritis, GERD, depression, hypertension, hyperlipidemia, hyponatremia, malnutrition, CKD, COPD, migraines presenting with lower extremity swelling    PT Comments    Pt was seen for follow up to evaluation, and continues to have significant issues of balance and safety with gait and transfers.  Pt agreed when PT saw her that she needs more help,  but then when case management saw her, pt denied she would go to rehab.  Pt is clearly unsafe, but is unwilling to go to safer location.  Counseled pt about injuries and risks, but it has apparently not stood up to her willingness to go to a safer location.  Follow acutely for PT as pt stay permits, and focus on balance and standing endurance for protection of her safety.  Recommendations for follow up therapy are one component of a multi-disciplinary discharge planning process, led by the attending physician.  Recommendations may be updated based on patient status, additional functional criteria and insurance authorization.  Follow Up Recommendations  Skilled nursing-short term rehab (<3 hours/day)     Assistance Recommended at Discharge Frequent or constant Supervision/Assistance  Equipment Recommendations  None recommended by PT    Recommendations for Other Services       Precautions / Restrictions Precautions Precautions: Fall Restrictions Weight Bearing Restrictions: Yes LUE Weight Bearing: Non weight bearing     Mobility  Bed Mobility Overal bed mobility: Needs Assistance Bed Mobility: Supine to Sit;Sit to Supine     Supine to sit: Min assist Sit to supine: Min assist   General bed mobility comments: support trunk OOB and return to bed supporting trunk    Transfers Overall transfer  level: Needs assistance Equipment used: 1 person hand held assist Transfers: Sit to/from Stand Sit to Stand: Mod assist           General transfer comment: mod with tendency to list backward, uses bed for support    Ambulation/Gait Ambulation/Gait assistance: Min assist Gait Distance (Feet): 150 Feet Assistive device: 1 person hand held assist Gait Pattern/deviations: Step-through pattern;Step-to pattern;Decreased stride length;Wide base of support Gait velocity: reduced Gait velocity interpretation: <1.8 ft/sec, indicate of risk for recurrent falls General Gait Details: tends to list back and to R side, unable to capture balance wihtout using PT for support   Stairs             Wheelchair Mobility    Modified Rankin (Stroke Patients Only)       Balance Overall balance assessment: Needs assistance Sitting-balance support: Feet supported Sitting balance-Leahy Scale: Fair     Standing balance support: Single extremity supported Standing balance-Leahy Scale: Poor Standing balance comment: pt cannot support on single device, needs support on her RUE                            Cognition Arousal/Alertness: Awake/alert Behavior During Therapy: Restless;Anxious Overall Cognitive Status: Impaired/Different from baseline Area of Impairment: Problem solving;Safety/judgement;Following commands;Memory;Attention;Orientation;Awareness                 Orientation Level: Situation Current Attention Level: Selective Memory: Decreased recall of precautions;Decreased short-term memory Following Commands: Follows one step commands inconsistently;Follows one step commands with increased time Safety/Judgement: Decreased awareness of safety;Decreased awareness of deficits Awareness: Intellectual Problem Solving: Slow processing;Decreased initiation;Difficulty  sequencing;Requires verbal cues;Requires tactile cues General Comments: pt is unable to help problem  solve for her injuries and management at home        Exercises      General Comments General comments (skin integrity, edema, etc.): pt was assisted to walk and transfer, tends to try to use LUE unless PT is cuing not to do so      Pertinent Vitals/Pain Pain Assessment: Faces Faces Pain Scale: Hurts even more Pain Location: L shoulder Pain Descriptors / Indicators: Guarding;Aching Pain Intervention(s): Limited activity within patient's tolerance;Monitored during session;Repositioned    Home Living                          Prior Function            PT Goals (current goals can now be found in the care plan section) Acute Rehab PT Goals Patient Stated Goal: to go home Progress towards PT goals: Progressing toward goals    Frequency    Min 3X/week      PT Plan Current plan remains appropriate    Co-evaluation              AM-PAC PT "6 Clicks" Mobility   Outcome Measure  Help needed turning from your back to your side while in a flat bed without using bedrails?: A Little Help needed moving from lying on your back to sitting on the side of a flat bed without using bedrails?: A Little Help needed moving to and from a bed to a chair (including a wheelchair)?: A Little Help needed standing up from a chair using your arms (e.g., wheelchair or bedside chair)?: A Lot Help needed to walk in hospital room?: A Little Help needed climbing 3-5 steps with a railing? : Total 6 Click Score: 15    End of Session Equipment Utilized During Treatment: Gait belt Activity Tolerance: Patient limited by fatigue;Patient limited by pain Patient left: in bed;with call bell/phone within reach;with bed alarm set Nurse Communication: Mobility status PT Visit Diagnosis: Unsteadiness on feet (R26.81);Other abnormalities of gait and mobility (R26.89);Muscle weakness (generalized) (M62.81);Pain Pain - Right/Left: Left Pain - part of body: Shoulder     Time: [id number] PT  Time Calculation (min) (ACUTE ONLY): 25 min  Charges:  $Gait Training: 8-22 mins $Therapeutic Activity: 8-22 mins            Ramond Dial 03/29/2021, 2:41 PM  Mee Hives, PT PhD Acute Rehab Dept. Number: [place] and [place]

## 2021-03-29 NOTE — Progress Notes (Signed)
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  Patient has not been eating and drinking well over the last few days.  She has been feeling weak and apparently fell therefore was brought to the hospital.  Upon admission trauma work-up including CT head and cervical spine were negative, sodium level was 132.  COVID test was negative.  Left shoulder x-ray showed mildly displaced fracture.  Orthopedic team was called, Dr Ninfa Linden, who recommended placing a sling in place and follow-up in 2 weeks.  PT recommended SNF but patient was adamant to go home despite her doing very poorly with physical therapy.   Assessment & Plan:   Principal Problem:   Weakness Active Problems:   Essential hypertension   Hyponatremia   Weakness generalized   Generalized weakness and fall Left shoulder displaced fracture - PT/OT is recommending SNF but patient is adamant about going home.  X-ray reviewed by orthopedic who recommends sling in place and outpatient follow-up in 2 weeks.  Advised to restrict her movement and she is able to come out of sling for hygiene routine.  Essential hypertension - Continue home medications.  Hyponatremia - Chronic.  Sodium at baseline 130  Anxiety/depression - Continue Remeron and Xanax  Consulted TOC team to help with disposition planning  DVT prophylaxis:   Lovenox Code Status: Full code Family Communication: Attempted to call her daughter multiple times but no response      Nutritional status  Nutrition Problem: Severe Malnutrition Etiology: social / environmental circumstances (depression)  Signs/Symptoms: severe fat depletion, severe muscle depletion  Interventions: Ensure Enlive (each supplement provides 350kcal and 20 grams of protein), Magic cup,  MVI  Body mass index is 14.6 kg/m.       Subjective: Patient is still having difficulty ambulating but she is adamant about going home despite of my concerns about her safety at home.   Review of Systems Otherwise negative except as per HPI, including: General: Denies fever, chills, night sweats or unintended weight loss. Resp: Denies cough, wheezing, shortness of breath. Cardiac: Denies chest pain, palpitations, orthopnea, paroxysmal nocturnal dyspnea. GI: Denies abdominal pain, nausea, vomiting, diarrhea or constipation GU: Denies dysuria, frequency, hesitancy or incontinence MS: Denies muscle aches, joint pain or swelling Neuro: Denies headache, neurologic deficits (focal weakness, numbness, tingling), abnormal gait Psych: Denies anxiety, depression, SI/HI/AVH Skin: Denies new rashes or lesions ID: Denies sick contacts, exotic exposures, travel  Examination:  General exam: Appears calm and comfortable ; frail Respiratory system: Clear to auscultation. Respiratory effort normal. Cardiovascular system: S1 & S2 heard, RRR. No JVD, murmurs, rubs, gallops or clicks. No pedal edema. Gastrointestinal system: Abdomen is nondistended, soft and nontender. No organomegaly or masses felt. Normal bowel sounds heard. Central nervous system: Alert and oriented. No focal neurological deficits. Extremities: limited ROM of Left shoulder.  Skin: No rashes, lesions or ulcers Psychiatry: Judgement and insight appear normal. Mood & affect appropriate.     Objective: Vitals:   03/28/21 1006 03/28/21 1716 03/28/21 2039 03/29/21 0513  BP: 110/70 138/88 122/75 134/72  Pulse: 82 66 72 70  Resp: 16 18 18 18   Temp: 98.3 F (36.8 C) 97.8 F (36.6 C) 98.2 F (36.8 C) 98 F (36.7 C)  TempSrc: Oral  Oral Oral  SpO2: 95% 98% 91% 95%  Weight:  Height:       No intake or output data in the 24 hours ending 03/29/21 0931 Filed Weights   03/28/21 0420  Weight: 39.8 kg     Data Reviewed:    CBC: Recent Labs  Lab 03/27/21 1525 03/28/21 0255 03/28/21 0436  WBC 6.0 6.2 6.4  NEUTROABS 2.8  --   --   HGB 13.0 12.2 12.3  HCT 39.2 36.1 37.1  MCV 101.3* 99.4 99.5  PLT 417* 386 824   Basic Metabolic Panel: Recent Labs  Lab 03/27/21 1525 03/28/21 0255 03/28/21 0436  NA 130*  --  129*  K 4.0  --  3.8  CL 95*  --  97*  CO2 25  --  25  GLUCOSE 85  --  82  BUN 12  --  8  CREATININE 0.63 0.44 0.54  CALCIUM 9.0  --  8.5*   GFR: Estimated Creatinine Clearance: 34.7 mL/min (by C-G formula based on SCr of 0.54 mg/dL). Liver Function Tests: Recent Labs  Lab 03/27/21 1525  AST 21  ALT 15  ALKPHOS 105  BILITOT 0.2*  PROT 6.2*  ALBUMIN 3.5   No results for input(s): LIPASE, AMYLASE in the last 168 hours. No results for input(s): AMMONIA in the last 168 hours. Coagulation Profile: No results for input(s): INR, PROTIME in the last 168 hours. Cardiac Enzymes: Recent Labs  Lab 03/27/21 1525  CKTOTAL 56   BNP (last 3 results) No results for input(s): PROBNP in the last 8760 hours. HbA1C: No results for input(s): HGBA1C in the last 72 hours. CBG: No results for input(s): GLUCAP in the last 168 hours. Lipid Profile: No results for input(s): CHOL, HDL, LDLCALC, TRIG, CHOLHDL, LDLDIRECT in the last 72 hours. Thyroid Function Tests: No results for input(s): TSH, T4TOTAL, FREET4, T3FREE, THYROIDAB in the last 72 hours. Anemia Panel: No results for input(s): VITAMINB12, FOLATE, FERRITIN, TIBC, IRON, RETICCTPCT in the last 72 hours. Sepsis Labs: No results for input(s): PROCALCITON, LATICACIDVEN in the last 168 hours.  Recent Results (from the past 240 hour(s))  Resp Panel by RT-PCR (Flu A&B, Covid) Nasopharyngeal Swab     Status: None   Collection Time: 03/19/21  8:21 PM   Specimen: Nasopharyngeal Swab; Nasopharyngeal(NP) swabs in vial transport medium  Result Value Ref Range Status   SARS Coronavirus 2 by RT PCR NEGATIVE NEGATIVE Final    Comment:  (NOTE) SARS-CoV-2 target nucleic acids are NOT DETECTED.  The SARS-CoV-2 RNA is generally detectable in upper respiratory specimens during the acute phase of infection. The lowest concentration of SARS-CoV-2 viral copies this assay can detect is 138 copies/mL. A negative result does not preclude SARS-Cov-2 infection and should not be used as the sole basis for treatment or other patient management decisions. A negative result may occur with  improper specimen collection/handling, submission of specimen other than nasopharyngeal swab, presence of viral mutation(s) within the areas targeted by this assay, and inadequate number of viral copies(<138 copies/mL). A negative result must be combined with clinical observations, patient history, and epidemiological information. The expected result is Negative.  Fact Sheet for Patients:  EntrepreneurPulse.com.au  Fact Sheet for Healthcare Providers:  IncredibleEmployment.be  This test is no t yet approved or cleared by the [place] FDA and  has been authorized for detection and/or diagnosis of SARS-CoV-2 by FDA under an Emergency Use Authorization (EUA). This EUA will remain  in effect (meaning this test can be used) for the duration of the COVID-19 declaration under Section 564(b)(1) of  the Act, 21 U.S.C.section 360bbb-3(b)(1), unless the authorization is terminated  or revoked sooner.       Influenza A by PCR NEGATIVE NEGATIVE Final   Influenza B by PCR NEGATIVE NEGATIVE Final    Comment: (NOTE) The Xpert Xpress SARS-CoV-2/FLU/RSV plus assay is intended as an aid in the diagnosis of influenza from Nasopharyngeal swab specimens and should not be used as a sole basis for treatment. Nasal washings and aspirates are unacceptable for Xpert Xpress SARS-CoV-2/FLU/RSV testing.  Fact Sheet for Patients: EntrepreneurPulse.com.au  Fact Sheet for Healthcare  Providers: IncredibleEmployment.be  This test is not yet approved or cleared by the [place] FDA and has been authorized for detection and/or diagnosis of SARS-CoV-2 by FDA under an Emergency Use Authorization (EUA). This EUA will remain in effect (meaning this test can be used) for the duration of the COVID-19 declaration under Section 564(b)(1) of the Act, 21 U.S.C. section 360bbb-3(b)(1), unless the authorization is terminated or revoked.  Performed at [facility] [place] [address], [place], [place] [address]   Resp Panel by RT-PCR (Flu A&B, Covid) Nasopharyngeal Swab     Status: None   Collection Time: 03/28/21  2:55 AM   Specimen: Nasopharyngeal Swab; Nasopharyngeal(NP) swabs in vial transport medium  Result Value Ref Range Status   SARS Coronavirus 2 by RT PCR NEGATIVE NEGATIVE Final    Comment: (NOTE) SARS-CoV-2 target nucleic acids are NOT DETECTED.  The SARS-CoV-2 RNA is generally detectable in upper respiratory specimens during the acute phase of infection. The lowest concentration of SARS-CoV-2 viral copies this assay can detect is 138 copies/mL. A negative result does not preclude SARS-Cov-2 infection and should not be used as the sole basis for treatment or other patient management decisions. A negative result may occur with  improper specimen collection/handling, submission of specimen other than nasopharyngeal swab, presence of viral mutation(s) within the areas targeted by this assay, and inadequate number of viral copies(<138 copies/mL). A negative result must be combined with clinical observations, patient history, and epidemiological information. The expected result is Negative.  Fact Sheet for Patients:  EntrepreneurPulse.com.au  Fact Sheet for Healthcare Providers:  IncredibleEmployment.be  This test is no t yet approved or cleared by the [place] FDA and  has been  authorized for detection and/or diagnosis of SARS-CoV-2 by FDA under an Emergency Use Authorization (EUA). This EUA will remain  in effect (meaning this test can be used) for the duration of the COVID-19 declaration under Section 564(b)(1) of the Act, 21 U.S.C.section 360bbb-3(b)(1), unless the authorization is terminated  or revoked sooner.       Influenza A by PCR NEGATIVE NEGATIVE Final   Influenza B by PCR NEGATIVE NEGATIVE Final    Comment: (NOTE) The Xpert Xpress SARS-CoV-2/FLU/RSV plus assay is intended as an aid in the diagnosis of influenza from Nasopharyngeal swab specimens and should not be used as a sole basis for treatment. Nasal washings and aspirates are unacceptable for Xpert Xpress SARS-CoV-2/FLU/RSV testing.  Fact Sheet for Patients: EntrepreneurPulse.com.au  Fact Sheet for Healthcare Providers: IncredibleEmployment.be  This test is not yet approved or cleared by the [place] FDA and has been authorized for detection and/or diagnosis of SARS-CoV-2 by FDA under an Emergency Use Authorization (EUA). This EUA will remain in effect (meaning this test can be used) for the duration of the COVID-19 declaration under Section 564(b)(1) of the Act, 21 U.S.C. section 360bbb-3(b)(1), unless the authorization is terminated or revoked.  Performed at [facility], [place]  [address], [place], [place] [address]          Radiology Studies: DG Chest 2 View  Result Date: 03/27/2021 CLINICAL DATA:  Fall EXAM: CHEST - 2 VIEW COMPARISON:  Chest radiograph 03/19/2021 FINDINGS: The cardiomediastinal silhouette is within normal limits. The lungs hyperinflated again suggesting COPD. There is no focal consolidation or pulmonary edema. There is no pleural effusion or pneumothorax. There is no acute osseous abnormality. IMPRESSION: Stable chest with no radiographic evidence of acute cardiopulmonary process. Electronically Signed   By: Valetta Mole M.D.   On: 03/27/2021 15:56   DG Pelvis 1-2 Views  Result Date: 03/27/2021 CLINICAL DATA:  Fall. EXAM: PELVIS - 1-2 VIEW COMPARISON:  Pelvis x-ray 01/12/2014. 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 The bones are osteopenic. There is no acute fracture or dislocation. Left hip joint space is well maintained. Sacroplasty changes noted bilaterally. Soft tissues are within normal limits. IMPRESSION: 1. No acute fracture or dislocation. Electronically Signed   By: Ronney Asters M.D.   On: 03/27/2021 16:03   DG Shoulder 1V Left  Result Date: 03/28/2021 CLINICAL DATA:  Left shoulder pain. EXAM: LEFT SHOULDER COMPARISON:  None. FINDINGS: Mildly displaced fracture of the greater tubercle of the left humeral head. No other acute fracture identified. The bones are osteopenic. There is no dislocation. Soft tissue swelling over the left shoulder. No radiopaque foreign object or soft tissue gas. IMPRESSION: Mildly displaced fracture of the greater tubercle of the left humeral head. Electronically Signed   By: Anner Crete M.D.   On: 03/28/2021 02:27   CT Head Wo Contrast  Result Date: 03/27/2021 CLINICAL DATA:  Facial trauma EXAM: CT HEAD WITHOUT CONTRAST TECHNIQUE: Contiguous axial images were obtained from the base of the skull through the vertex without intravenous contrast. COMPARISON:  03/22/2020 FINDINGS: Brain: 6 There is atrophy and chronic small vessel disease changes. No acute intracranial abnormality. Specifically, no hemorrhage, hydrocephalus, mass lesion, acute infarction, or significant intracranial injury. Vascular: No hyperdense vessel or unexpected calcification. Skull: No acute calvarial abnormality. Sinuses/Orbits: No acute findings Other: None IMPRESSION: Atrophy, chronic microvascular disease. No acute intracranial abnormality. Electronically Signed   By: Rolm Baptise M.D.   On: 03/27/2021 22:40   CT Cervical Spine Wo Contrast  Result Date: 03/27/2021 CLINICAL  DATA:  Neck trauma, mechanically unstable spine (Age >= 16y) EXAM: CT CERVICAL SPINE WITHOUT CONTRAST TECHNIQUE: Multidetector CT imaging of the cervical spine was performed without intravenous contrast. Multiplanar CT image reconstructions were also generated. COMPARISON:  None. FINDINGS: Alignment: No subluxation. Skull base and vertebrae: No acute fracture. No primary bone lesion or focal pathologic process. Soft tissues and spinal canal: No prevertebral fluid or swelling. No visible canal hematoma. Disc levels: Degenerative disc disease most pronounced from C4-5 through C6-7. Bilateral degenerative facet disease. Upper chest: No acute findings Other: None IMPRESSION: Degenerative disc and facet disease.  No acute bony abnormality. Electronically Signed   By: Rolm Baptise M.D.   On: 03/27/2021 22:41   DG Shoulder Left  Result Date: 03/28/2021 CLINICAL DATA:  Left proximal humerus fracture EXAM: LEFT SHOULDER - 2+ VIEW COMPARISON:  Same day radiograph FINDINGS: There is a mildly displaced greater tuberosity fracture of the left proximal humerus. There is mild displacement and angulation. Adjacent soft tissue swelling. Normal glenohumeral alignment. Normal AC joint alignment. IMPRESSION: Mildly displaced and angulated greater tuberosity fracture. Electronically Signed   By: Maurine Simmering M.D.   On: 03/28/2021 11:41        Scheduled  Meds:  ALPRAZolam  0.5 mg Oral QHS   enoxaparin (LOVENOX) injection  20 mg Subcutaneous Q24H   feeding supplement  237 mL Oral TID BM   mirtazapine  30 mg Oral QHS   multivitamin with minerals  1 tablet Oral Daily   sodium chloride  1 g Oral Daily   Continuous Infusions:   LOS: 0 days   Time spent= 35 mins    Torria Fromer Arsenio Loader, MD [facility]  If 7PM-7AM, please contact night-coverage  03/29/2021, 9:31 AM

## 2021-03-29 NOTE — Plan of Care (Signed)

## 2021-03-29 NOTE — TOC Transition Note (Signed)
Transition of Care [facility]) - CM/SW Discharge Note   Patient Details  Name: Holly Valenzuela MRN: [id number] Date of Birth: June 10, 1940  Transition of Care [facility]) CM/SW Contact:  Bartholomew Crews, RN Phone Number: [phone] 03/29/2021, 1:44 PM   Clinical Narrative:     Damaris Schooner with Lanetta Inch at [facility] - verified referral accepted for RN, PT, OT, Aide, and SW. HH and Face to Face orders provided by provider.   Final next level of care: [place] Barriers to Discharge: No Barriers Identified   Patient Goals and CMS Choice Patient states their goals for this hospitalization and ongoing recovery are:: To go home CMS Medicare.gov Compare Post Acute Care list provided to:: Patient Choice offered to / list presented to : Patient  Discharge Placement                       Discharge Plan and Services   Discharge Planning Services: CM Consult            DME Arranged: N/A DME Agency: NA       HH Arranged: RN, PT, OT, Nurse's Aide, Social Work [employer] Agency: [place] Date Anon [place]: 03/29/21 Time [place]: 1344 Representative spoke with at [place]: [place] ([place]) Interventions     Readmission Risk Interventions Readmission Risk Prevention Plan 03/01/2020  Post Dischage Appt Not Complete  Appt Comments rec for SNF  Medication Screening Complete  Transportation Screening Complete  Some recent data might be hidden
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
## 2021-03-30 DIAGNOSIS — Z8542 Personal history of malignant neoplasm of other parts of uterus: Secondary | ICD-10-CM | POA: Diagnosis not present

## 2021-03-30 DIAGNOSIS — R531 Weakness: Secondary | ICD-10-CM | POA: Diagnosis present

## 2021-03-30 DIAGNOSIS — E538 Deficiency of other specified B group vitamins: Secondary | ICD-10-CM | POA: Diagnosis present

## 2021-03-30 DIAGNOSIS — F32A Depression, unspecified: Secondary | ICD-10-CM | POA: Diagnosis present

## 2021-03-30 DIAGNOSIS — J449 Chronic obstructive pulmonary disease, unspecified: Secondary | ICD-10-CM | POA: Diagnosis present

## 2021-03-30 DIAGNOSIS — R627 Adult failure to thrive: Secondary | ICD-10-CM | POA: Diagnosis present

## 2021-03-30 DIAGNOSIS — Y92009 Unspecified place in unspecified non-institutional (private) residence as the place of occurrence of the external cause: Secondary | ICD-10-CM | POA: Diagnosis not present

## 2021-03-30 DIAGNOSIS — S42252A Displaced fracture of greater tuberosity of left humerus, initial encounter for closed fracture: Secondary | ICD-10-CM | POA: Diagnosis present

## 2021-03-30 DIAGNOSIS — Z681 Body mass index (BMI) 19 or less, adult: Secondary | ICD-10-CM | POA: Diagnosis not present

## 2021-03-30 DIAGNOSIS — Z8249 Family history of ischemic heart disease and other diseases of the circulatory system: Secondary | ICD-10-CM | POA: Diagnosis not present

## 2021-03-30 DIAGNOSIS — E222 Syndrome of inappropriate secretion of antidiuretic hormone: Secondary | ICD-10-CM | POA: Diagnosis present

## 2021-03-30 DIAGNOSIS — Z20822 Contact with and (suspected) exposure to covid-19: Secondary | ICD-10-CM | POA: Diagnosis present

## 2021-03-30 DIAGNOSIS — E43 Unspecified severe protein-calorie malnutrition: Secondary | ICD-10-CM | POA: Diagnosis present

## 2021-03-30 DIAGNOSIS — Z823 Family history of stroke: Secondary | ICD-10-CM | POA: Diagnosis not present

## 2021-03-30 DIAGNOSIS — W19XXXA Unspecified fall, initial encounter: Secondary | ICD-10-CM | POA: Diagnosis present

## 2021-03-30 DIAGNOSIS — Z79899 Other long term (current) drug therapy: Secondary | ICD-10-CM | POA: Diagnosis not present

## 2021-03-30 DIAGNOSIS — Z96641 Presence of right artificial hip joint: Secondary | ICD-10-CM | POA: Diagnosis present

## 2021-03-30 DIAGNOSIS — Z87440 Personal history of urinary (tract) infections: Secondary | ICD-10-CM | POA: Diagnosis not present

## 2021-03-30 DIAGNOSIS — I10 Essential (primary) hypertension: Secondary | ICD-10-CM | POA: Diagnosis present

## 2021-03-30 DIAGNOSIS — Z87891 Personal history of nicotine dependence: Secondary | ICD-10-CM | POA: Diagnosis not present

## 2021-03-30 DIAGNOSIS — F17210 Nicotine dependence, cigarettes, uncomplicated: Secondary | ICD-10-CM | POA: Diagnosis present

## 2021-03-30 DIAGNOSIS — E559 Vitamin D deficiency, unspecified: Secondary | ICD-10-CM | POA: Diagnosis present

## 2021-03-30 DIAGNOSIS — G43909 Migraine, unspecified, not intractable, without status migrainosus: Secondary | ICD-10-CM | POA: Diagnosis present

## 2021-03-30 DIAGNOSIS — R64 Cachexia: Secondary | ICD-10-CM | POA: Diagnosis present

## 2021-03-30 DIAGNOSIS — E785 Hyperlipidemia, unspecified: Secondary | ICD-10-CM | POA: Diagnosis present

## 2021-03-30 DIAGNOSIS — Z820 Family history of epilepsy and other diseases of the nervous system: Secondary | ICD-10-CM | POA: Diagnosis not present

## 2021-03-30 DIAGNOSIS — R262 Difficulty in walking, not elsewhere classified: Secondary | ICD-10-CM | POA: Diagnosis present

## 2021-03-30 NOTE — Plan of Care (Signed)

## 2021-03-30 NOTE — TOC Progression Note (Signed)
Transition of Care [facility]) - Progression Note    Patient Details  Name: Holly Valenzuela MRN: [id number] Date of Birth: 28-Apr-1941  Transition of Care [facility]) CM/SW [place], [place] Phone Number: 03/30/2021, 10:12 AM  Clinical Narrative:    CSW was notified by MD that pt is now agreeable to SNF placement. CSW spoke with pt at bedside who confirmed that she was worse off this morning than she had originally thought. RN had noted she was very difficult to ambulate and had incontinence. Pt stating she had never been to rehab, and had no preference " just a good one". She prefers to be in [place], but is open to a further fax out. Pt has had covid vaccines, but is unsure of how many. Her plan is to return back home once she is a little stronger. CSW will complete workup and faxout. TOC will continue to follow.   Expected Discharge Plan: Skilled Nursing Facility Barriers to Discharge: SNF Pending bed offer  Expected Discharge Plan and Services Expected Discharge Plan: [place]   Discharge Planning Services: CM Consult Post Acute Care Choice: [place] Living arrangements for the past 2 months: Single Family Home Expected Discharge Date: 03/28/21               DME Arranged: N/A DME Agency: NA       HH Arranged: RN, PT, OT, Nurse's Aide, Social Work [employer] Agency: [place] Date [place]: 03/29/21 Time [place]: 1344 Representative spoke with at [place]: [place] ([place]) Interventions    Readmission Risk Interventions Readmission Risk Prevention Plan 03/01/2020  Post Dischage Appt Not Complete  Appt Comments rec for SNF  Medication Screening Complete  Transportation Screening Complete  Some recent data might be hidden

## 2021-03-30 NOTE — Progress Notes (Signed)
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  Patient has not been eating and drinking well over the last few days.  She has been feeling weak and apparently fell therefore was brought to the hospital.  Upon admission trauma work-up including CT head and cervical spine were negative, sodium level was 132.  COVID test was negative.  Left shoulder x-ray showed mildly displaced fracture.  Orthopedic team was called, Dr Ninfa Linden, who recommended placing a sling in place and follow-up in 2 weeks.  PT recommended SNF but patient was adamant to go home despite her doing very poorly with physical therapy.  Patient was also seen by psychiatry and determined that she overall does have capacity to make decisions at this time.   Assessment & Plan:   Principal Problem:   Weakness Active Problems:   Essential hypertension   Hyponatremia   Weakness generalized   Generalized weakness and fall Left shoulder displaced fracture - PT/OT is recommending SNF patient is now finally agreeable to this.  She is unsafe to go home given instability.  Keep her left shoulder in sling for 2 weeks and follow-up outpatient with Dr. Ninfa Linden. Seen by psychiatry, she does have capacity to make her own decision  Essential hypertension - Continue home medications.  Hyponatremia - Chronic.  Sodium at baseline around 130  Anxiety/depression - Continue Remeron and Xanax  Consulted TOC team to help with disposition planning.  Patient independently lives at home and now having great difficulty with ambulation especially with fractured shoulder.  She would benefit from SNF.  DVT prophylaxis:   Lovenox Code Status: Full code Family Communication: No response for family members.    Nutritional status  Nutrition  Problem: Severe Malnutrition Etiology: social / environmental circumstances (depression)  Signs/Symptoms: severe fat depletion, severe muscle depletion  Interventions: Ensure Enlive (each supplement provides 350kcal and 20 grams of protein), Magic cup, MVI  Body mass index is 14.6 kg/m.       Subjective: Patient is very unsteady with ambulation and having increased difficulty especially with left shoulder fracture.  She is agreeable to SNF today and agrees it is unsafe for her to go home.  Review of Systems Otherwise negative except as per HPI, including: General: Denies fever, chills, night sweats or unintended weight loss. Resp: Denies cough, wheezing, shortness of breath. Cardiac: Denies chest pain, palpitations, orthopnea, paroxysmal nocturnal dyspnea. GI: Denies abdominal pain, nausea, vomiting, diarrhea or constipation GU: Denies dysuria, frequency, hesitancy or incontinence MS: Denies muscle aches, joint pain or swelling Neuro: Denies headache, neurologic deficits (focal weakness, numbness, tingling), abnormal gait Psych: Denies anxiety, depression, SI/HI/AVH Skin: Denies new rashes or lesions ID: Denies sick contacts, exotic exposures, travel  Examination:  Constitutional: Not in acute distress Respiratory: Clear to auscultation bilaterally Cardiovascular: Normal sinus rhythm, no rubs Abdomen: Nontender nondistended good bowel sounds Musculoskeletal: Limited range of motion of left upper extremity Skin: No rashes seen Neurologic: CN 2-12 grossly intact.  And nonfocal Psychiatric: Normal judgment and insight. Alert and oriented x 3. Normal mood.  Objective: Vitals:   03/29/21 0513 03/29/21 0946 03/29/21 2103 03/30/21 0453  BP: 134/72 133/86 127/83 117/87  Pulse: 70 72 69 73  Resp: 18 18 18 16   Temp: 98 F (36.7 C) 97.8 F (36.6 C) 97.7 F (36.5 C) 98.4 F (36.9 C)  TempSrc: Oral  Oral   SpO2: 95% 95% 99% 97%  Weight:      Height:        Intake/Output  Summary (Last 24 hours) at 03/30/2021 0738 Last data filed at 03/30/2021 0554 Gross per 24 hour  Intake 577 ml  Output 300 ml  Net 277 ml   Filed Weights   03/28/21 0420  Weight: 39.8 kg     Data Reviewed:   CBC: Recent Labs  Lab 03/27/21 1525 03/28/21 0255 03/28/21 0436  WBC 6.0 6.2 6.4  NEUTROABS 2.8  --   --   HGB 13.0 12.2 12.3  HCT 39.2 36.1 37.1  MCV 101.3* 99.4 99.5  PLT 417* 386 151   Basic Metabolic Panel: Recent Labs  Lab 03/27/21 1525 03/28/21 0255 03/28/21 0436  NA 130*  --  129*  K 4.0  --  3.8  CL 95*  --  97*  CO2 25  --  25  GLUCOSE 85  --  82  BUN 12  --  8  CREATININE 0.63 0.44 0.54  CALCIUM 9.0  --  8.5*   GFR: Estimated Creatinine Clearance: 34.7 mL/min (by C-G formula based on SCr of 0.54 mg/dL). Liver Function Tests: Recent Labs  Lab 03/27/21 1525  AST 21  ALT 15  ALKPHOS 105  BILITOT 0.2*  PROT 6.2*  ALBUMIN 3.5   No results for input(s): LIPASE, AMYLASE in the last 168 hours. No results for input(s): AMMONIA in the last 168 hours. Coagulation Profile: No results for input(s): INR, PROTIME in the last 168 hours. Cardiac Enzymes: Recent Labs  Lab 03/27/21 1525  CKTOTAL 56   BNP (last 3 results) No results for input(s): PROBNP in the last 8760 hours. HbA1C: No results for input(s): HGBA1C in the last 72 hours. CBG: No results for input(s): GLUCAP in the last 168 hours. Lipid Profile: No results for input(s): CHOL, HDL, LDLCALC, TRIG, CHOLHDL, LDLDIRECT in the last 72 hours. Thyroid Function Tests: No results for input(s): TSH, T4TOTAL, FREET4, T3FREE, THYROIDAB in the last 72 hours. Anemia Panel: No results for input(s): VITAMINB12, FOLATE, FERRITIN, TIBC, IRON, RETICCTPCT in the last 72 hours. Sepsis Labs: No results for input(s): PROCALCITON, LATICACIDVEN in the last 168 hours.  Recent Results (from the past 240 hour(s))  Resp Panel by RT-PCR (Flu A&B, Covid) Nasopharyngeal Swab     Status: None   Collection  Time: 03/28/21  2:55 AM   Specimen: Nasopharyngeal Swab; Nasopharyngeal(NP) swabs in vial transport medium  Result Value Ref Range Status   SARS Coronavirus 2 by RT PCR NEGATIVE NEGATIVE Final    Comment: (NOTE) SARS-CoV-2 target nucleic acids are NOT DETECTED.  The SARS-CoV-2 RNA is generally detectable in upper respiratory specimens during the acute phase of infection. The lowest concentration of SARS-CoV-2 viral copies this assay can detect is 138 copies/mL. A negative result does not preclude SARS-Cov-2 infection and should not be used as the sole basis for treatment or other patient management decisions. A negative result may occur with  improper specimen collection/handling, submission of specimen other than nasopharyngeal swab, presence of viral mutation(s) within the areas targeted by this assay, and inadequate number of viral copies(<138 copies/mL). A negative result must be combined with clinical observations, patient history, and epidemiological information. The expected result is Negative.  Fact Sheet for Patients:  EntrepreneurPulse.com.au  Fact Sheet for Healthcare Providers:  IncredibleEmployment.be  This test is no t yet approved or cleared by the [place] FDA and  has been  authorized for detection and/or diagnosis of SARS-CoV-2 by FDA under an Emergency Use Authorization (EUA). This EUA will remain  in effect (meaning this test can be used) for the duration of the COVID-19 declaration under Section 564(b)(1) of the Act, 21 U.S.C.section 360bbb-3(b)(1), unless the authorization is terminated  or revoked sooner.       Influenza A by PCR NEGATIVE NEGATIVE Final   Influenza B by PCR NEGATIVE NEGATIVE Final    Comment: (NOTE) The Xpert Xpress SARS-CoV-2/FLU/RSV plus assay is intended as an aid in the diagnosis of influenza from Nasopharyngeal swab specimens and should not be used as a sole basis for treatment. Nasal washings  and aspirates are unacceptable for Xpert Xpress SARS-CoV-2/FLU/RSV testing.  Fact Sheet for Patients: EntrepreneurPulse.com.au  Fact Sheet for Healthcare Providers: IncredibleEmployment.be  This test is not yet approved or cleared by the [place] FDA and has been authorized for detection and/or diagnosis of SARS-CoV-2 by FDA under an Emergency Use Authorization (EUA). This EUA will remain in effect (meaning this test can be used) for the duration of the COVID-19 declaration under Section 564(b)(1) of the Act, 21 U.S.C. section 360bbb-3(b)(1), unless the authorization is terminated or revoked.  Performed at [facility], [place] [address], [place], [place] [address]          Radiology Studies: DG Shoulder Left  Result Date: 03/28/2021 CLINICAL DATA:  Left proximal humerus fracture EXAM: LEFT SHOULDER - 2+ VIEW COMPARISON:  Same day radiograph FINDINGS: There is a mildly displaced greater tuberosity fracture of the left proximal humerus. There is mild displacement and angulation. Adjacent soft tissue swelling. Normal glenohumeral alignment. Normal AC joint alignment. IMPRESSION: Mildly displaced and angulated greater tuberosity fracture. Electronically Signed   By: Maurine Simmering M.D.   On: 03/28/2021 11:41        Scheduled Meds:  ALPRAZolam  0.5 mg Oral QHS   enoxaparin (LOVENOX) injection  20 mg Subcutaneous Q24H   feeding supplement  237 mL Oral TID BM   mirtazapine  30 mg Oral QHS   multivitamin with minerals  1 tablet Oral Daily   sodium chloride  1 g Oral Daily   Continuous Infusions:   LOS: 0 days   Time spent= 35 mins    Aneka Fagerstrom Arsenio Loader, MD [facility]  If 7PM-7AM, please contact night-coverage  03/30/2021, 7:38 AM

## 2021-03-30 NOTE — NC FL2 (Signed)
[place] LEVEL OF CARE SCREENING TOOL     IDENTIFICATION  Patient Name: Holly Valenzuela Birthdate: 04-18-1941 Sex: female Admission Date (Current Location): 03/27/2021  [facility] and [place] Number:  Herbalist and Address:  [facility] [place], [place] [address], [place], [place] [address]      Provider Number: [id number]  Attending Physician Name and Address:  Damita Lack, MD  Relative Name and Phone Number:  Lily Kocher [phone]    Current Level of Care: Hospital Recommended Level of Care: [place] Prior Approval Number:    Date Approved/Denied:   PASRR Number: [id number]  Discharge Plan: SNF    Current Diagnoses: Patient Active Problem List   Diagnosis Date Noted   Increased weakness when ambulating 03/30/2021   Weakness generalized 03/28/2021   Edema 03/19/2021   Colitis 03/22/2020   Syncope 02/28/2020   Essential hypertension 02/28/2020   Hyponatremia    Pain    Weakness    Protein-calorie malnutrition, severe ([place]) 04/19/2014   Sacral back pain    Failure to thrive in adult 04/18/2014   Tachycardia 04/18/2014   Vitamin D deficiency 04/18/2014   Hyperlipidemia 04/18/2014   Tobacco abuse 04/18/2014   Fall 04/17/2014   Sacral fracture, closed ([place])    Pure hypercholesterolemia 01/16/2014   Esophageal reflux 01/16/2014   Other B-complex deficiencies 01/16/2014   Arthritis of right hip 01/12/2014   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   Syncope 11/21/2011   Ribs, multiple fractures 11/21/2011   Leukocytosis 11/21/2011   Depression 11/21/2011    Orientation RESPIRATION BLADDER Height & Weight     Self, Time, Situation, Place  Normal Incontinent Weight: 87 lb 11.9 oz (39.8 kg) Height:  5\' 5"  (165.1 cm)  BEHAVIORAL SYMPTOMS/MOOD NEUROLOGICAL BOWEL NUTRITION STATUS      Continent Diet (See DC summary)  AMBULATORY STATUS COMMUNICATION OF NEEDS Skin   Extensive  Assist Verbally Normal                       Personal Care Assistance Level of Assistance  Bathing, Feeding, Dressing Bathing Assistance: Limited assistance Feeding assistance: Independent Dressing Assistance: Limited assistance     Functional Limitations Info  Sight, Hearing, Speech Sight Info: Adequate Hearing Info: Adequate Speech Info: Adequate    SPECIAL CARE FACTORS FREQUENCY  PT (By licensed PT), OT (By licensed OT)     PT Frequency: 5x week OT Frequency: 5x week            Contractures Contractures Info: Not present    Additional Factors Info  Code Status, Allergies, Psychotropic Code Status Info: Full Allergies Info: Adhesive (Tape)   Codeine   Hydrocodone-acetaminophen Psychotropic Info: Xanax         Current Medications (03/30/2021):  This is the current hospital active medication list Current Facility-Administered Medications  Medication Dose Route Frequency Provider Last Rate Last Admin   acetaminophen (TYLENOL) tablet 650 mg  650 mg Oral Q6H PRN Rise Patience, MD   650 mg at 03/28/21 2100   ALPRAZolam Duanne Moron) tablet 0.5 mg  0.5 mg Oral QHS Rise Patience, MD   0.5 mg at 03/29/21 2106   ALPRAZolam (XANAX) tablet 0.5 mg  0.5 mg Oral BID PRN Damita Lack, MD   0.5 mg at 03/30/21 0909   enoxaparin (LOVENOX) 100 mg/mL injection 20 mg  20 mg Subcutaneous Q24H Rise Patience, MD   20 mg at 03/29/21  0910   feeding supplement (ENSURE ENLIVE / ENSURE PLUS) liquid 237 mL  237 mL Oral TID BM Amin, Ankit Chirag, MD   237 mL at 03/30/21 0910   hydrALAZINE (APRESOLINE) injection 10 mg  10 mg Intravenous Q4H PRN Rise Patience, MD       mirtazapine (REMERON) tablet 30 mg  30 mg Oral QHS Rise Patience, MD   30 mg at 03/29/21 2105   multivitamin with minerals tablet 1 tablet  1 tablet Oral Daily Damita Lack, MD   1 tablet at 03/29/21 0413   sodium chloride tablet 1 g  1 g Oral Daily Rise Patience, MD   1 g at  03/30/21 6438     Discharge Medications: Please see discharge summary for a list of discharge medications.  Relevant Imaging Results:  Relevant Lab Results:   Additional Information [id number] [place], [place]

## 2021-03-31 DIAGNOSIS — R531 Weakness: Secondary | ICD-10-CM | POA: Diagnosis not present

## 2021-03-31 NOTE — Plan of Care (Signed)

## 2021-03-31 NOTE — TOC Progression Note (Addendum)
Transition of Care [facility]) - Progression Note    Patient Details  Name: Holly Valenzuela MRN: [id number] Date of Birth: [birth year]  Transition of Care [facility]) CM/SW [place], [place] Phone Number: 03/31/2021, 12:12 PM  Clinical Narrative:   CSW attempted to meet with patient to discuss bed offers for SNF, and patient refused discussion. Patient was spitting up, crying, and saying she was going to die and nobody cared about her. CSW attempted to provide support, but patient was frustrated that CSW could not get the patient up and asked her to go get help. CSW notified RN. CSW to follow back up with patient at another time.   UPDATE 3:41 PM: CSW met with patient again and she was more calm and agreeable to conversation. Patient said she's ready to get out of the hospital, reviewed bed offers with CSW and chose [facility]. [facility] will have a bed available tomorrow, will need a new covid test. CSW updated MD. CSW to follow.    Expected Discharge Plan: Skilled Nursing Facility Barriers to Discharge: SNF Pending bed offer  Expected Discharge Plan and Services Expected Discharge Plan: [place]   Discharge Planning Services: CM Consult Post Acute Care Choice: [place] Living arrangements for the past 2 months: Single Family Home Expected Discharge Date: 03/31/21               DME Arranged: N/A DME Agency: NA       HH Arranged: RN, PT, OT, Nurse's Aide, Social Work [employer] Agency: [place] Date [place]: 03/29/21 Time [place]: 1344 Representative spoke with at [place]: [place] ([place]) Interventions    Readmission Risk Interventions Readmission Risk Prevention Plan 03/01/2020  Post Dischage Appt Not Complete  Appt Comments rec for SNF  Medication Screening Complete  Transportation Screening Complete  Some recent data might be hidden

## 2021-03-31 NOTE — Discharge Summary (Addendum)
Physician Discharge Summary  Holly Valenzuela:[id number] DOB: 09-06-1940 DOA: 03/27/2021  PCP: Holly Infante, MD  Admit date: 03/27/2021 Discharge date: 04/01/2021  Admitted From: Home Disposition:  SNF  Recommendations for Outpatient Follow-up:  Follow up with PCP in 1-2 weeks Please obtain BMP/CBC in one week your next doctors visit.  She has left shoulder nondisplaced fracture, recommend wearing sling and limiting left shoulder abduction and external rotation.  She should not try to reach overhead with that arm until further notice by orthopedic.  She can come out of the sling for hygiene purposes.  Follow-up with outpatient orthopedic, Dr. 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  Patient has not been eating and drinking well over the last few days.  She has been feeling weak and apparently fell therefore was brought to the hospital.  Upon admission trauma work-up including CT head and cervical spine were negative, sodium level was 132.  COVID test was negative.  Left shoulder x-ray showed mildly displaced fracture.  Orthopedic team was called, Dr Holly Valenzuela, who recommended placing a sling in place and follow-up in 2 weeks.  PT recommended SNF but patient was adamant to go home despite her doing very poorly with physical therapy.  Patient was also seen by psychiatry and determined that she overall does have capacity to make decisions at this time.  PT recommended SNF therefore arrangements were eventually made as patient was agreeable.     Assessment & Plan:   Principal Problem:   Weakness Active Problems:   Essential  hypertension   Hyponatremia   Weakness generalized     Generalized weakness and fall Left shoulder displaced fracture - PT/OT is recommending SNF patient is now finally agreeable to this.  She is unsafe to go home given instability.  Keep her left shoulder in sling for 2 weeks and follow-up outpatient with Dr. Ninfa Valenzuela.  Rest of the recommendations as mentioned above Seen by psychiatry, she does have capacity to make her own decision   Essential hypertension - Continue home medications.   Hyponatremia - Chronic.  Sodium at baseline around 130   Anxiety/depression - Continue Remeron and Xanax  Transition to SNF when bed is available  Body mass index is 14.53 kg/m.         Discharge Diagnoses:  Principal Problem:   Weakness Active Problems:   Essential hypertension   Hyponatremia   Weakness generalized   Increased weakness when ambulating      Consultations: Orthopedic  Subjective: Relocated no complaints.  Discharge Exam: Vitals:   04/01/21 0434 04/01/21 0931  BP: 135/84 122/76  Pulse: 70 70  Resp: 16 16  Temp: (!) 97.5 F (36.4 C) 98.7 F (37.1 C)  SpO2: 96% 94%   Vitals:   03/31/21 0936 03/31/21 2001 04/01/21 0434 04/01/21 0931  BP: 125/84 115/76 135/84 122/76  Pulse: 79 73 70 70  Resp: 18  16 16   Temp: 98.8 F (37.1 C) 98 F (36.7 C) (!) 97.5 F (36.4 C) 98.7 F (37.1 C)  TempSrc: Oral Oral Oral Oral  SpO2: 94% 96% 96% 94%  Weight:  39.6 kg    Height:        General:  Pt is alert, awake, not in acute distress, elderly frail Cardiovascular: RRR, S1/S2 +, no rubs, no gallops Respiratory: CTA bilaterally, no wheezing, no rhonchi Abdominal: Soft, NT, ND, bowel sounds + Extremities: no edema, no cyanosis  Discharge Instructions   Allergies as of 04/01/2021       Reactions   Adhesive [tape] Itching   Codeine Nausea And Vomiting   Other reaction(s): Unknown   Hydrocodone-acetaminophen    Other reaction(s): Unknown         Medication List     STOP taking these medications    losartan 50 MG tablet Commonly known as: COZAAR       TAKE these medications    ALPRAZolam 0.5 MG tablet Commonly known as: XANAX Take 0.5-1 tablets (0.25-0.5 mg total) by mouth 3 (three) times daily as needed for anxiety. What changed:  how much to take when to take this   amLODipine 5 MG tablet Commonly known as: NORVASC Take 5 mg by mouth daily.   ascorbic acid 500 MG tablet Commonly known as: VITAMIN C Take 500-1,000 mg by mouth daily.   atorvastatin 10 MG tablet Commonly known as: LIPITOR Take 10 mg by mouth daily.   butalbital-acetaminophen-caffeine 50-325-40 MG tablet Commonly known as: FIORICET Take 0.5-1 tablets by mouth 4 (four) times daily as needed for migraine.   cyanocobalamin 1000 MCG/ML injection Commonly known as: (VITAMIN B-12) Inject 1,000 mcg into the muscle every 30 (thirty) days.   famotidine 20 MG tablet Commonly known as: PEPCID Take 1 tablet (20 mg total) by mouth daily as needed for heartburn or indigestion.   fluticasone 50 MCG/ACT nasal spray Commonly known as: FLONASE Place 2 sprays into both nostrils daily as needed for allergies or rhinitis.   mirtazapine 30 MG tablet Commonly known as: REMERON Take 30 mg by mouth at bedtime.   pantoprazole 40 MG tablet Commonly known as: PROTONIX Take 40 mg by mouth daily as needed (heartburn).   sodium chloride 1 g tablet Take 1 tablet (1 g total) by mouth daily. Follow up with Dr. [name] for repeat labs within a week What changed: additional instructions        Follow-up Information     Mcarthur Rossetti, MD. Schedule an appointment as soon as possible for a visit in 2 week(s).   Specialty: Orthopedic Surgery Contact information: [place] [address] [place], [place] Follow up.   Specialty: [place] Why: the office will call to schedule home health visits,  please allow 48 hours for contact Contact information: [place] [address] [phone]         Holly Infante, MD Follow up in 1 week(s).   Specialty: Internal Medicine Contact information: [place] [address] [phone]         Donato Heinz, MD .   Specialties: Cardiology, Radiology Contact information: [address] [place] [address] [phone]                Allergies  Allergen Reactions   Adhesive [Tape] Itching   Codeine Nausea And Vomiting    Other reaction(s): Unknown   Hydrocodone-Acetaminophen     Other reaction(s): Unknown    You were cared for by a hospitalist during your hospital stay. If you have any questions about your discharge medications or the care you received while you were in the hospital after you are discharged, you  can call the unit and asked to speak with the hospitalist on call if the hospitalist that took care of you is not available. Once you are discharged, your primary care physician will handle any further medical issues. Please note that no refills for any discharge medications will be authorized once you are discharged, as it is imperative that you return to your primary care physician (or establish a relationship with a primary care physician if you do not have one) for your aftercare needs so that they can reassess your need for medications and monitor your lab values.   Procedures/Studies: DG Chest 2 View  Result Date: 03/27/2021 CLINICAL DATA:  Fall EXAM: CHEST - 2 VIEW COMPARISON:  Chest radiograph 03/19/2021 FINDINGS: The cardiomediastinal silhouette is within normal limits. The lungs hyperinflated again suggesting COPD. There is no focal consolidation or pulmonary edema. There is no pleural effusion or pneumothorax. There is no acute osseous abnormality. IMPRESSION: Stable chest with no radiographic evidence of acute cardiopulmonary process.  Electronically Signed   By: Valetta Mole M.D.   On: 03/27/2021 15:56   DG Chest 2 View  Result Date: 03/19/2021 CLINICAL DATA:  Edema, swelling EXAM: CHEST - 2 VIEW COMPARISON:  02/28/2020 FINDINGS: There is hyperinflation of the lungs compatible with COPD. Heart and mediastinal contours are within normal limits. No focal opacities or effusions. No acute bony abnormality. Aortic atherosclerosis. IMPRESSION: COPD.  No active disease. Electronically Signed   By: Rolm Baptise M.D.   On: 03/19/2021 17:49   DG Pelvis 1-2 Views  Result Date: 03/27/2021 CLINICAL DATA:  Fall. EXAM: PELVIS - 1-2 VIEW COMPARISON:  Pelvis x-ray 01/12/2014. 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 The bones are osteopenic. There is no acute fracture or dislocation. Left hip joint space is well maintained. Sacroplasty changes noted bilaterally. Soft tissues are within normal limits. IMPRESSION: 1. No acute fracture or dislocation. Electronically Signed   By: Ronney Asters M.D.   On: 03/27/2021 16:03   DG Shoulder 1V Left  Result Date: 03/28/2021 CLINICAL DATA:  Left shoulder pain. EXAM: LEFT SHOULDER COMPARISON:  None. FINDINGS: Mildly displaced fracture of the greater tubercle of the left humeral head. No other acute fracture identified. The bones are osteopenic. There is no dislocation. Soft tissue swelling over the left shoulder. No radiopaque foreign object or soft tissue gas. IMPRESSION: Mildly displaced fracture of the greater tubercle of the left humeral head. Electronically Signed   By: Anner Crete M.D.   On: 03/28/2021 02:27   CT Head Wo Contrast  Result Date: 03/27/2021 CLINICAL DATA:  Facial trauma EXAM: CT HEAD WITHOUT CONTRAST TECHNIQUE: Contiguous axial images were obtained from the base of the skull through the vertex without intravenous contrast. COMPARISON:  03/22/2020 FINDINGS: Brain: 6 There is atrophy and chronic small vessel disease changes. No acute intracranial abnormality.  Specifically, no hemorrhage, hydrocephalus, mass lesion, acute infarction, or significant intracranial injury. Vascular: No hyperdense vessel or unexpected calcification. Skull: No acute calvarial abnormality. Sinuses/Orbits: No acute findings Other: None IMPRESSION: Atrophy, chronic microvascular disease. No acute intracranial abnormality. Electronically Signed   By: Rolm Baptise M.D.   On: 03/27/2021 22:40   CT Cervical Spine Wo Contrast  Result Date: 03/27/2021 CLINICAL DATA:  Neck trauma, mechanically unstable spine (Age >= 16y) EXAM: CT CERVICAL SPINE WITHOUT CONTRAST TECHNIQUE: Multidetector CT imaging of the cervical spine was performed without intravenous contrast. Multiplanar CT image reconstructions were also generated. COMPARISON:  None. FINDINGS: Alignment: No subluxation. Skull base and vertebrae: No acute  fracture. No primary bone lesion or focal pathologic process. Soft tissues and spinal canal: No prevertebral fluid or swelling. No visible canal hematoma. Disc levels: Degenerative disc disease most pronounced from C4-5 through C6-7. Bilateral degenerative facet disease. Upper chest: No acute findings Other: None IMPRESSION: Degenerative disc and facet disease.  No acute bony abnormality. Electronically Signed   By: Rolm Baptise M.D.   On: 03/27/2021 22:41   DG Shoulder Left  Result Date: 03/28/2021 CLINICAL DATA:  Left proximal humerus fracture EXAM: LEFT SHOULDER - 2+ VIEW COMPARISON:  Same day radiograph FINDINGS: There is a mildly displaced greater tuberosity fracture of the left proximal humerus. There is mild displacement and angulation. Adjacent soft tissue swelling. Normal glenohumeral alignment. Normal AC joint alignment. IMPRESSION: Mildly displaced and angulated greater tuberosity fracture. Electronically Signed   By: Maurine Simmering M.D.   On: 03/28/2021 11:41   ECHOCARDIOGRAM COMPLETE  Result Date: 03/20/2021    ECHOCARDIOGRAM REPORT   Patient Name:   JACEY ECKERSON [id number] Date  of Exam: 03/20/2021 Medical Rec #:  [id number]           Height:       66.0 in Accession #:    [id number]          Weight:       88.6 lb Date of Birth:  1940-07-09           BSA:          1.415 m Patient Age:    74 years            BP:           118/80 mmHg Patient Gender: F                   HR:           77 bpm. Exam Location:  Inpatient Procedure: 2D Echo, Cardiac Doppler and Color Doppler Indications:     Other abnormalities  History:         Patient has prior history of Echocardiogram examinations, most                  recent 02/29/2020. Risk Factors:Hypertension.  Sonographer:     Helmut Muster Referring Phys:  [place] Diagnosing Phys: Franki Monte IMPRESSIONS  1. Left ventricular ejection fraction, by estimation, is 60 to 65%. The left ventricle has normal function. The left ventricle has no regional wall motion abnormalities. Left ventricular diastolic parameters are consistent with Grade I diastolic dysfunction (impaired relaxation).  2. Right ventricular systolic function is normal. The right ventricular size is normal. Tricuspid regurgitation signal is inadequate for assessing PA pressure.  3. The mitral valve is normal in structure. No evidence of mitral valve regurgitation. No evidence of mitral stenosis.  4. The aortic valve is tricuspid. Aortic valve regurgitation is not visualized. Mild aortic valve sclerosis is present, with no evidence of aortic valve stenosis.  5. The inferior vena cava is normal in size with greater than 50% respiratory variability, suggesting right atrial pressure of 3 mmHg.  6. A small pericardial effusion is present. FINDINGS  Left Ventricle: Left ventricular ejection fraction, by estimation, is 60 to 65%. The left ventricle has normal function. The left ventricle has no regional wall motion abnormalities. The left ventricular internal cavity size was normal in size. There is  no left ventricular hypertrophy. Left ventricular diastolic parameters are  consistent with Grade I diastolic dysfunction (impaired relaxation). Right Ventricle: The right  ventricular size is normal. No increase in right ventricular wall thickness. Right ventricular systolic function is normal. Tricuspid regurgitation signal is inadequate for assessing PA pressure. Left Atrium: Left atrial size was normal in size. Right Atrium: Right atrial size was normal in size. Pericardium: A small pericardial effusion is present. Mitral Valve: The mitral valve is normal in structure. No evidence of mitral valve regurgitation. No evidence of mitral valve stenosis. Tricuspid Valve: The tricuspid valve is normal in structure. Tricuspid valve regurgitation is trivial. Aortic Valve: The aortic valve is tricuspid. Aortic valve regurgitation is not visualized. Mild aortic valve sclerosis is present, with no evidence of aortic valve stenosis. Pulmonic Valve: The pulmonic valve was normal in structure. Pulmonic valve regurgitation is not visualized. Aorta: The aortic root is normal in size and structure. Venous: The inferior vena cava is normal in size with greater than 50% respiratory variability, suggesting right atrial pressure of 3 mmHg. IAS/Shunts: No atrial level shunt detected by color flow Doppler. [employer] Electronically signed by Franki Monte Signature Date/Time: 03/20/2021/8:25:24 PM    Final    VAS [place] LOWER EXTREMITY VENOUS (DVT) (7a-7p)  Result Date: 03/19/2021  Lower Venous DVT Study Patient Name:  ITALIA WOLFERT [id number]  Date of Exam:   03/19/2021 Medical Rec #: [id number]            Accession #:    [id number] Date of Birth: [birth year]            Patient Gender: F Patient Age:   16 years Exam Location:  [facility] Procedure:      VAS [place] LOWER EXTREMITY VENOUS (DVT) Referring Phys: SOPHIA CACCAVALE --------------------------------------------------------------------------------  Indications: Swelling RT>LT.  Comparison Study: No prior studies. Performing Technologist: Darlin Coco RDMS, RVT  Examination Guidelines: A complete evaluation includes B-mode imaging, spectral Doppler, color Doppler, and power Doppler as needed of all accessible portions of each vessel. Bilateral testing is considered an integral part of a complete examination. Limited examinations for reoccurring indications may be performed as noted. The reflux portion of the exam is performed with the patient in reverse Trendelenburg.  +---------+---------------+---------+-----------+----------+-------------------+ RIGHT    CompressibilityPhasicitySpontaneityPropertiesThrombus Aging      +---------+---------------+---------+-----------+----------+-------------------+ CFV      Full           Yes      Yes                                      +---------+---------------+---------+-----------+----------+-------------------+ SFJ      Full                                                             +---------+---------------+---------+-----------+----------+-------------------+ FV Prox  Full                                                             +---------+---------------+---------+-----------+----------+-------------------+ FV Mid   Full                                                             +---------+---------------+---------+-----------+----------+-------------------+  FV DistalFull                                                             +---------+---------------+---------+-----------+----------+-------------------+ PFV      Full                                                             +---------+---------------+---------+-----------+----------+-------------------+ POP      Full           Yes      Yes                  Appearance of                                                             thickened valve at                                                        popliteal level      +---------+---------------+---------+-----------+----------+-------------------+ PTV      Full                                                             +---------+---------------+---------+-----------+----------+-------------------+ PERO     Full                                                             +---------+---------------+---------+-----------+----------+-------------------+ Gastroc  Full                                                             +---------+---------------+---------+-----------+----------+-------------------+     Summary: RIGHT: - There is no evidence of deep vein thrombosis in the lower extremity.  - No cystic structure found in the popliteal fossa.   *See table(s) above for measurements and observations. Electronically signed by Orlie Pollen on 03/19/2021 at 7:47:40 PM.    Final      The results of significant diagnostics from this hospitalization (including imaging, microbiology, ancillary and laboratory) are listed below for reference.     Microbiology: Recent Results (from the past 240 hour(s))  Resp Panel by RT-PCR (Flu A&B, Covid) Nasopharyngeal Swab  Status: None   Collection Time: 03/28/21  2:55 AM   Specimen: Nasopharyngeal Swab; Nasopharyngeal(NP) swabs in vial transport medium  Result Value Ref Range Status   SARS Coronavirus 2 by RT PCR NEGATIVE NEGATIVE Final    Comment: (NOTE) SARS-CoV-2 target nucleic acids are NOT DETECTED.  The SARS-CoV-2 RNA is generally detectable in upper respiratory specimens during the acute phase of infection. The lowest concentration of SARS-CoV-2 viral copies this assay can detect is 138 copies/mL. A negative result does not preclude SARS-Cov-2 infection and should not be used as the sole basis for treatment or other patient management decisions. A negative result may occur with  improper specimen collection/handling, submission of specimen other than nasopharyngeal swab, presence of viral  mutation(s) within the areas targeted by this assay, and inadequate number of viral copies(<138 copies/mL). A negative result must be combined with clinical observations, patient history, and epidemiological information. The expected result is Negative.  Fact Sheet for Patients:  EntrepreneurPulse.com.au  Fact Sheet for Healthcare Providers:  IncredibleEmployment.be  This test is no t yet approved or cleared by the [place] FDA and  has been authorized for detection and/or diagnosis of SARS-CoV-2 by FDA under an Emergency Use Authorization (EUA). This EUA will remain  in effect (meaning this test can be used) for the duration of the COVID-19 declaration under Section 564(b)(1) of the Act, 21 U.S.C.section 360bbb-3(b)(1), unless the authorization is terminated  or revoked sooner.       Influenza A by PCR NEGATIVE NEGATIVE Final   Influenza B by PCR NEGATIVE NEGATIVE Final    Comment: (NOTE) The Xpert Xpress SARS-CoV-2/FLU/RSV plus assay is intended as an aid in the diagnosis of influenza from Nasopharyngeal swab specimens and should not be used as a sole basis for treatment. Nasal washings and aspirates are unacceptable for Xpert Xpress SARS-CoV-2/FLU/RSV testing.  Fact Sheet for Patients: EntrepreneurPulse.com.au  Fact Sheet for Healthcare Providers: IncredibleEmployment.be  This test is not yet approved or cleared by the [place] FDA and has been authorized for detection and/or diagnosis of SARS-CoV-2 by FDA under an Emergency Use Authorization (EUA). This EUA will remain in effect (meaning this test can be used) for the duration of the COVID-19 declaration under Section 564(b)(1) of the Act, 21 U.S.C. section 360bbb-3(b)(1), unless the authorization is terminated or revoked.  Performed at [facility], [place] [address], [place], [place] [address]   Resp Panel by RT-PCR (Flu A&B,  Covid) Nasopharyngeal Swab     Status: None   Collection Time: 04/01/21 10:24 AM   Specimen: Nasopharyngeal Swab; Nasopharyngeal(NP) swabs in vial transport medium  Result Value Ref Range Status   SARS Coronavirus 2 by RT PCR NEGATIVE NEGATIVE Final    Comment: (NOTE) SARS-CoV-2 target nucleic acids are NOT DETECTED.  The SARS-CoV-2 RNA is generally detectable in upper respiratory specimens during the acute phase of infection. The lowest concentration of SARS-CoV-2 viral copies this assay can detect is 138 copies/mL. A negative result does not preclude SARS-Cov-2 infection and should not be used as the sole basis for treatment or other patient management decisions. A negative result may occur with  improper specimen collection/handling, submission of specimen other than nasopharyngeal swab, presence of viral mutation(s) within the areas targeted by this assay, and inadequate number of viral copies(<138 copies/mL). A negative result must be combined with clinical observations, patient history, and epidemiological information. The expected result is Negative.  Fact Sheet for Patients:  EntrepreneurPulse.com.au  Fact Sheet for Healthcare Providers:  IncredibleEmployment.be  This test is no t yet approved or cleared by the [place] and  has been authorized for detection and/or diagnosis of SARS-CoV-2 by FDA under an Emergency Use Authorization (EUA). This EUA will remain  in effect (meaning this test can be used) for the duration of the COVID-19 declaration under Section 564(b)(1) of the Act, 21 U.S.C.section 360bbb-3(b)(1), unless the authorization is terminated  or revoked sooner.       Influenza A by PCR NEGATIVE NEGATIVE Final   Influenza B by PCR NEGATIVE NEGATIVE Final    Comment: (NOTE) The Xpert Xpress SARS-CoV-2/FLU/RSV plus assay is intended as an aid in the diagnosis of influenza from Nasopharyngeal swab specimens and should  not be used as a sole basis for treatment. Nasal washings and aspirates are unacceptable for Xpert Xpress SARS-CoV-2/FLU/RSV testing.  Fact Sheet for Patients: EntrepreneurPulse.com.au  Fact Sheet for Healthcare Providers: IncredibleEmployment.be  This test is not yet approved or cleared by the [place] FDA and has been authorized for detection and/or diagnosis of SARS-CoV-2 by FDA under an Emergency Use Authorization (EUA). This EUA will remain in effect (meaning this test can be used) for the duration of the COVID-19 declaration under Section 564(b)(1) of the Act, 21 U.S.C. section 360bbb-3(b)(1), unless the authorization is terminated or revoked.  Performed at [facility], [place] [address], [place], [place] [address]      Labs: BNP (last 3 results) Recent Labs    03/19/21 1350  BNP 41.9   Basic Metabolic Panel: Recent Labs  Lab 03/27/21 1525 03/28/21 0255 03/28/21 0436  NA 130*  --  129*  K 4.0  --  3.8  CL 95*  --  97*  CO2 25  --  25  GLUCOSE 85  --  82  BUN 12  --  8  CREATININE 0.63 0.44 0.54  CALCIUM 9.0  --  8.5*   Liver Function Tests: Recent Labs  Lab 03/27/21 1525  AST 21  ALT 15  ALKPHOS 105  BILITOT 0.2*  PROT 6.2*  ALBUMIN 3.5   No results for input(s): LIPASE, AMYLASE in the last 168 hours. No results for input(s): AMMONIA in the last 168 hours. CBC: Recent Labs  Lab 03/27/21 1525 03/28/21 0255 03/28/21 0436  WBC 6.0 6.2 6.4  NEUTROABS 2.8  --   --   HGB 13.0 12.2 12.3  HCT 39.2 36.1 37.1  MCV 101.3* 99.4 99.5  PLT 417* 386 400   Cardiac Enzymes: Recent Labs  Lab 03/27/21 1525  CKTOTAL 56   BNP: Invalid input(s): POCBNP CBG: No results for input(s): GLUCAP in the last 168 hours. D-Dimer No results for input(s): DDIMER in the last 72 hours. Hgb A1c No results for input(s): HGBA1C in the last 72 hours. Lipid Profile No results for input(s): CHOL, HDL, LDLCALC, TRIG,  CHOLHDL, LDLDIRECT in the last 72 hours. Thyroid function studies No results for input(s): TSH, T4TOTAL, T3FREE, THYROIDAB in the last 72 hours.  Invalid input(s): FREET3 Anemia work up No results for input(s): VITAMINB12, FOLATE, FERRITIN, TIBC, IRON, RETICCTPCT in the last 72 hours. Urinalysis    Component Value Date/Time   COLORURINE STRAW (A) 03/27/2021 1513   APPEARANCEUR CLEAR 03/27/2021 1513   LABSPEC 1.008 03/27/2021 1513   PHURINE 7.0 03/27/2021 1513   GLUCOSEU NEGATIVE 03/27/2021 1513   HGBUR NEGATIVE 03/27/2021 1513   BILIRUBINUR NEGATIVE 03/27/2021 1513   [place] 03/27/2021 1513   PROTEINUR NEGATIVE 03/27/2021 1513   UROBILINOGEN 0.2 04/19/2014 1253  NITRITE NEGATIVE 03/27/2021 1513   LEUKOCYTESUR NEGATIVE 03/27/2021 1513   Sepsis Labs Invalid input(s): PROCALCITONIN,  WBC,  LACTICIDVEN Microbiology Recent Results (from the past 240 hour(s))  Resp Panel by RT-PCR (Flu A&B, Covid) Nasopharyngeal Swab     Status: None   Collection Time: 03/28/21  2:55 AM   Specimen: Nasopharyngeal Swab; Nasopharyngeal(NP) swabs in vial transport medium  Result Value Ref Range Status   SARS Coronavirus 2 by RT PCR NEGATIVE NEGATIVE Final    Comment: (NOTE) SARS-CoV-2 target nucleic acids are NOT DETECTED.  The SARS-CoV-2 RNA is generally detectable in upper respiratory specimens during the acute phase of infection. The lowest concentration of SARS-CoV-2 viral copies this assay can detect is 138 copies/mL. A negative result does not preclude SARS-Cov-2 infection and should not be used as the sole basis for treatment or other patient management decisions. A negative result may occur with  improper specimen collection/handling, submission of specimen other than nasopharyngeal swab, presence of viral mutation(s) within the areas targeted by this assay, and inadequate number of viral copies(<138 copies/mL). A negative result must be combined with clinical observations,  patient history, and epidemiological information. The expected result is Negative.  Fact Sheet for Patients:  EntrepreneurPulse.com.au  Fact Sheet for Healthcare Providers:  IncredibleEmployment.be  This test is no t yet approved or cleared by the [place] FDA and  has been authorized for detection and/or diagnosis of SARS-CoV-2 by FDA under an Emergency Use Authorization (EUA). This EUA will remain  in effect (meaning this test can be used) for the duration of the COVID-19 declaration under Section 564(b)(1) of the Act, 21 U.S.C.section 360bbb-3(b)(1), unless the authorization is terminated  or revoked sooner.       Influenza A by PCR NEGATIVE NEGATIVE Final   Influenza B by PCR NEGATIVE NEGATIVE Final    Comment: (NOTE) The Xpert Xpress SARS-CoV-2/FLU/RSV plus assay is intended as an aid in the diagnosis of influenza from Nasopharyngeal swab specimens and should not be used as a sole basis for treatment. Nasal washings and aspirates are unacceptable for Xpert Xpress SARS-CoV-2/FLU/RSV testing.  Fact Sheet for Patients: EntrepreneurPulse.com.au  Fact Sheet for Healthcare Providers: IncredibleEmployment.be  This test is not yet approved or cleared by the [place] FDA and has been authorized for detection and/or diagnosis of SARS-CoV-2 by FDA under an Emergency Use Authorization (EUA). This EUA will remain in effect (meaning this test can be used) for the duration of the COVID-19 declaration under Section 564(b)(1) of the Act, 21 U.S.C. section 360bbb-3(b)(1), unless the authorization is terminated or revoked.  Performed at [facility], [place] [address] [place], [place] [address]   Resp Panel by RT-PCR (Flu A&B, Covid) Nasopharyngeal Swab     Status: None   Collection Time: 04/01/21 10:24 AM   Specimen: Nasopharyngeal Swab; Nasopharyngeal(NP) swabs in vial transport medium  Result  Value Ref Range Status   SARS Coronavirus 2 by RT PCR NEGATIVE NEGATIVE Final    Comment: (NOTE) SARS-CoV-2 target nucleic acids are NOT DETECTED.  The SARS-CoV-2 RNA is generally detectable in upper respiratory specimens during the acute phase of infection. The lowest concentration of SARS-CoV-2 viral copies this assay can detect is 138 copies/mL. A negative result does not preclude SARS-Cov-2 infection and should not be used as the sole basis for treatment or other patient management decisions. A negative result may occur with  improper specimen collection/handling, submission of specimen other than nasopharyngeal swab, presence of viral mutation(s) within the areas targeted by  this assay, and inadequate number of viral copies(<138 copies/mL). A negative result must be combined with clinical observations, patient history, and epidemiological information. The expected result is Negative.  Fact Sheet for Patients:  EntrepreneurPulse.com.au  Fact Sheet for Healthcare Providers:  IncredibleEmployment.be  This test is no t yet approved or cleared by the [place] FDA and  has been authorized for detection and/or diagnosis of SARS-CoV-2 by FDA under an Emergency Use Authorization (EUA). This EUA will remain  in effect (meaning this test can be used) for the duration of the COVID-19 declaration under Section 564(b)(1) of the Act, 21 U.S.C.section 360bbb-3(b)(1), unless the authorization is terminated  or revoked sooner.       Influenza A by PCR NEGATIVE NEGATIVE Final   Influenza B by PCR NEGATIVE NEGATIVE Final    Comment: (NOTE) The Xpert Xpress SARS-CoV-2/FLU/RSV plus assay is intended as an aid in the diagnosis of influenza from Nasopharyngeal swab specimens and should not be used as a sole basis for treatment. Nasal washings and aspirates are unacceptable for Xpert Xpress SARS-CoV-2/FLU/RSV testing.  Fact Sheet for  Patients: EntrepreneurPulse.com.au  Fact Sheet for Healthcare Providers: IncredibleEmployment.be  This test is not yet approved or cleared by the [place] FDA and has been authorized for detection and/or diagnosis of SARS-CoV-2 by FDA under an Emergency Use Authorization (EUA). This EUA will remain in effect (meaning this test can be used) for the duration of the COVID-19 declaration under Section 564(b)(1) of the Act, 21 U.S.C. section 360bbb-3(b)(1), unless the authorization is terminated or revoked.  Performed at [facility], [place] [address] [place], [place] [address]      Time coordinating discharge:  I have spent 35 minutes face to face with the patient and on the ward discussing the patients care, assessment, plan and disposition with other care givers. >50% of the time was devoted counseling the patient about the risks and benefits of treatment/Discharge disposition and coordinating care.   SIGNED:   Damita Lack, MD  [facility] 04/01/2021, 11:37 AM   If 7PM-7AM, please contact night-coverage

## 2021-03-31 NOTE — Consult Note (Signed)
   [facility] Inpatient Consult   03/31/2021  Holly Valenzuela January 05, 1941 [id number]  [place] [facility] [ACO] Patient: Medicare CMS DCE  Primary Care Provider:  Crist Infante, MD    Patient screened for less than 7 days readmission hospitalization and to assess for potential [place] Management service needs for post hospital transition.  Review of patient's medical record reveals patient is being recommended for a skilled nursing facility level of care.  Reviewed PT and LCSW notes. Patient resting in bed. Spoke with patient regarding her plans.  She states she feels like if she gets to rehab she could return home. States that her daughter lives in [place] and her grandchildren are in college. She states, "I wouldn't disrupt them for what's going on with me. I think I will be aright after I get some rehab."   Plan:  Continue to follow progress and disposition to assess for post hospital care management needs.  If patient transitions to a skilled nursing facility and facility is affiliated with [facility], then can have [facility] RN follow up for post facility barriers to returning to the community.  For questions contact:   Natividad Brood, RN BSN [facility] Liaison  [phone] business mobile phone Toll free office [phone]  Fax number: [phone] [email or web address]

## 2021-03-31 NOTE — Progress Notes (Signed)
Occupational Therapy Treatment Patient Details Name: KHARMA SAMPSEL MRN: [id number] DOB: Apr 06, 1941 Today's Date: 03/31/2021   History of present illness HUNTLEY DEMEDEIROS is a 80 y.o. female with medical history significant of colitis, arthritis, GERD, depression, hypertension, hyperlipidemia, hyponatremia, malnutrition, CKD, COPD, migraines presenting with lower extremity swelling   OT comments  Patient continues to make progress towards goals in skilled OT session. Patient's session encompassed  ADLs, bed mobility, and ambulation to engage in household distances. Pt with improved cognition, and able to state NWB with LUE. Patient with improved sitting balance at EOB, with decreased posterior lean in standing to transfer to chair. Patient with continued need for increased assist in order to ambulate further distances. Pt able to complete ADLs in recliner with set up. Discharge remains appropriate, therapy will continue to follow.    Recommendations for follow up therapy are one component of a multi-disciplinary discharge planning process, led by the attending physician.  Recommendations may be updated based on patient status, additional functional criteria and insurance authorization.    Follow Up Recommendations  Skilled nursing-short term rehab (<3 hours/day)    Assistance Recommended at Discharge    Equipment Recommendations  BSC;Tub/shower seat    Recommendations for Other Services      Precautions / Restrictions Precautions Precautions: Fall Required Braces or Orthoses: Sling Restrictions Weight Bearing Restrictions: Yes LUE Weight Bearing: Non weight bearing       Mobility Bed Mobility Overal bed mobility: Needs Assistance Bed Mobility: Supine to Sit     Supine to sit: Min guard          Transfers Overall transfer level: Needs assistance Equipment used: 1 person hand held assist Transfers: Sit to/from Stand Sit to Stand: Min assist Stand pivot  transfers: Min assist         General transfer comment: min A to steady, decreased posterior tilt noted when transferring however remains unsteady when attempting longer distances     Balance Overall balance assessment: Needs assistance Sitting-balance support: Feet supported       Standing balance support: Single extremity supported Standing balance-Leahy Scale: Fair                             ADL either performed or assessed with clinical judgement   ADL       Grooming: Wash/dry hands;Wash/dry face;Set up;Sitting;Oral care       Lower Body Bathing: Moderate assistance;Sit to/from stand Lower Body Bathing Details (indicate cue type and reason): poor stand balance Upper Body Dressing : Sitting;Adhering to UE precautions;Cueing for safety;Cueing for sequencing       Toilet Transfer: Moderate assistance;Stand-pivot Toilet Transfer Details (indicate cue type and reason): simulated                 Vision       Perception     Praxis      Cognition Arousal/Alertness: Awake/alert Behavior During Therapy: WFL for tasks assessed/performed Overall Cognitive Status: Within Functional Limits for tasks assessed                                 General Comments: Improved problem solving and able to verbalize limitations with LUE          Exercises     Shoulder Instructions       General Comments      Pertinent Vitals/ Pain  Pain Assessment: Faces Pain Score: 0-No pain  Home Living                                          Prior Functioning/Environment              Frequency  Min 2X/week        Progress Toward Goals  OT Goals(current goals can now be found in the care plan section)  Progress towards OT goals: Progressing toward goals  Acute Rehab OT Goals Patient Stated Goal: Go home OT Goal Formulation: With patient Time For Goal Achievement: 04/11/21 Potential to Achieve Goals: Good   Plan Discharge plan remains appropriate    Co-evaluation                 AM-PAC OT "6 Clicks" Daily Activity     Outcome Measure   Help from another person eating meals?: None Help from another person taking care of personal grooming?: A Little Help from another person toileting, which includes using toliet, bedpan, or urinal?: A Lot Help from another person bathing (including washing, rinsing, drying)?: A Lot Help from another person to put on and taking off regular upper body clothing?: A Little Help from another person to put on and taking off regular lower body clothing?: A Little 6 Click Score: 17    End of Session Equipment Utilized During Treatment: Gait belt  OT Visit Diagnosis: Unsteadiness on feet (R26.81);Other abnormalities of gait and mobility (R26.89);Muscle weakness (generalized) (M62.81);Pain;History of falling (Z91.81) Pain - Right/Left: Left Pain - part of body: Shoulder   Activity Tolerance Patient tolerated treatment well   Patient Left in chair;with call bell/phone within reach   Nurse Communication Mobility status        Time: 1610-9604 OT Time Calculation (min): 23 min  Charges: OT General Charges $OT Visit: 1 Visit OT Treatments $Self Care/Home Management : 23-37 mins  [place]. Bence Trapp, COTA/L Acute Rehabilitation Services [place] 03/31/2021, 12:27 PM

## 2021-03-31 NOTE — TOC Progression Note (Addendum)
Transition of Care [facility]) - Progression Note    Patient Details  Name: Holly Valenzuela MRN: [id number] Date of Birth: 07/15/1940  [facility] of [facility]) CM/SW Contact  Tom-Johnson, Renea Ee, RN Phone Number: 03/31/2021, 5:01 PM  Clinical Narrative:    Patient's daughter, [name] called CM about discharge disposition. [name] states she has returned back to [place] and requesting patient discharge to a long term SNF, preferably [facility] in [place] as patient's only living sibling lives there and they are very close. [name] states patient's MD deemed her incompetent to make decisions. CM told [name] that patient's information will be faxed to [facility], depending on bed availability. [name] states patient does not have enough money therefore, Medicaid should be applied for. Application for Long term medicaid will be sent. CM will continue to follow.   Expected Discharge Plan: Skilled Nursing Facility Barriers to Discharge: SNF Pending bed offer  Expected Discharge Plan and Services Expected Discharge Plan: Startup   Discharge Planning Services: CM Consult Post Acute Care Choice: [place] Living arrangements for the past 2 months: Single Family Home Expected Discharge Date: 03/31/21               DME Arranged: N/A DME Agency: NA       HH Arranged: RN, PT, OT, Nurse's Aide, Social Work [employer] Agency: [place] Date [place]: 03/29/21 Time [place]: 1344 Representative spoke with at [place]: [place] ([place]) Interventions    Readmission Risk Interventions Readmission Risk Prevention Plan 03/01/2020  Post Dischage Appt Not Complete  Appt Comments rec for SNF  Medication Screening Complete  Transportation Screening Complete  Some recent data might be hidden

## 2021-04-01 LAB — RESP PANEL BY RT-PCR (FLU A&B, COVID) ARPGX2
Influenza A by PCR: NEGATIVE
Influenza B by PCR: NEGATIVE
SARS Coronavirus 2 by RT PCR: NEGATIVE

## 2021-04-01 NOTE — Progress Notes (Signed)
NURSING PROGRESS NOTE  Holly Valenzuela [id number] Discharge Data: 04/01/2021 3:20 PM Attending Provider: No att. providers found NMM:[id number], Holly [place], MD     Holly Valenzuela to be D/C'd Skilled nursing facility per MD order.  Discussed with the patient the After Visit Summary and all questions fully answered. All IV's discontinued with no bleeding noted. All belongings returned to patient for patient to take home.   Last Vital Signs:  Blood pressure 122/76, pulse 70, temperature 98.7 F (37.1 C), temperature source Oral, resp. rate 16, height 5\' 5"  (1.651 m), weight 39.6 kg, SpO2 94 %.  Discharge Medication List Allergies as of 04/01/2021       Reactions   Adhesive [tape] Itching   Codeine Nausea And Vomiting   Other reaction(s): Unknown   Hydrocodone-acetaminophen    Other reaction(s): Unknown        Medication List     STOP taking these medications    losartan 50 MG tablet Commonly known as: COZAAR       TAKE these medications    ALPRAZolam 0.5 MG tablet Commonly known as: XANAX Take 0.5-1 tablets (0.25-0.5 mg total) by mouth 3 (three) times daily as needed for anxiety. What changed:  how much to take when to take this   amLODipine 5 MG tablet Commonly known as: NORVASC Take 5 mg by mouth daily. Notes to patient: 04/02/2021   ascorbic acid 500 MG tablet Commonly known as: VITAMIN C Take 500-1,000 mg by mouth daily. Notes to patient: 04/02/2021   atorvastatin 10 MG tablet Commonly known as: LIPITOR Take 10 mg by mouth daily. Notes to patient: 04/02/2021   butalbital-acetaminophen-caffeine 50-325-40 MG tablet Commonly known as: FIORICET Take 0.5-1 tablets by mouth 4 (four) times daily as needed for migraine.   cyanocobalamin 1000 MCG/ML injection Commonly known as: (VITAMIN B-12) Inject 1,000 mcg into the muscle every 30 (thirty) days. Notes to patient: Continue home schedule    famotidine 20 MG tablet Commonly known as: PEPCID Take 1 tablet (20  mg total) by mouth daily as needed for heartburn or indigestion.   fluticasone 50 MCG/ACT nasal spray Commonly known as: FLONASE Place 2 sprays into both nostrils daily as needed for allergies or rhinitis.   mirtazapine 30 MG tablet Commonly known as: REMERON Take 30 mg by mouth at bedtime. Notes to patient: 04/01/2021   pantoprazole 40 MG tablet Commonly known as: PROTONIX Take 40 mg by mouth daily as needed (heartburn).   sodium chloride 1 g tablet Take 1 tablet (1 g total) by mouth daily. Follow up with Dr. Joylene Valenzuela for repeat labs within a week What changed: additional instructions Notes to patient: 04/02/2021

## 2021-04-01 NOTE — Progress Notes (Signed)
Seen and examined at bedside, no complaints.  Doing okay.  Vital signs are stable.  Not in acute distress, limited range of motion of his left shoulder.  This is currently in sling.  Abdomen is nontender nondistended.  Discharge summary completed yesterday on 10/31, and updated today. She is medically stable for discharge.  Pending COVID test for placement.  Call with further questions as needed.  Gerlean Ren MD [facility]

## 2021-04-01 NOTE — Progress Notes (Signed)
Physical Therapy Treatment Patient Details Name: Holly Valenzuela MRN: [id number] DOB: 01-31-1941 Today's Date: 04/01/2021   History of Present Illness Holly Valenzuela is a 80 y.o. female with medical history significant of colitis, arthritis, GERD, depression, hypertension, hyperlipidemia, hyponatremia, malnutrition, CKD, COPD, migraines presenting with lower extremity swelling    PT Comments    Pt progressing towards her physical therapy goals. Initiated session with ADL task of donning clothes in preparation for discharge. Pt requiring min assist for transfers and ambulation in the room. Demonstrates a slow, shuffling gait pattern. Presents as a high fall risk based on history of falls, decreased gait speed and safety awareness. Continue to recommend SNF for ongoing Physical Therapy.      Recommendations for follow up therapy are one component of a multi-disciplinary discharge planning process, led by the attending physician.  Recommendations may be updated based on patient status, additional functional criteria and insurance authorization.  Follow Up Recommendations  Skilled nursing-short term rehab (<3 hours/day)     Assistance Recommended at Discharge Frequent or constant Supervision/Assistance  Equipment Recommendations  None recommended by PT    Recommendations for Other Services       Precautions / Restrictions Precautions Precautions: Fall Required Braces or Orthoses: Sling Restrictions Weight Bearing Restrictions: Yes LUE Weight Bearing: Non weight bearing     Mobility  Bed Mobility Overal bed mobility: Needs Assistance Bed Mobility: Supine to Sit     Supine to sit: Supervision          Transfers Overall transfer level: Needs assistance Equipment used: 1 person hand held assist Transfers: Sit to/from Stand Sit to Stand: Min assist           General transfer comment: MinA to steady    Ambulation/Gait Ambulation/Gait assistance: Min  assist Gait Distance (Feet): 25 Feet Assistive device: 1 person hand held assist Gait Pattern/deviations: Step-through pattern;Step-to pattern;Decreased stride length;Shuffle Gait velocity: decreased Gait velocity interpretation: <1.8 ft/sec, indicate of risk for recurrent falls General Gait Details: Shuffling gait pattern, decreased bilateral foot clearance, minA for balance   Stairs             Wheelchair Mobility    Modified Rankin (Stroke Patients Only)       Balance Overall balance assessment: Needs assistance Sitting-balance support: Feet supported Sitting balance-Leahy Scale: Fair     Standing balance support: Single extremity supported Standing balance-Leahy Scale: Poor                              Cognition Arousal/Alertness: Awake/alert Behavior During Therapy: WFL for tasks assessed/performed Overall Cognitive Status: Impaired/Different from baseline Area of Impairment: Problem solving;Safety/judgement;Following commands;Memory;Attention;Awareness                   Current Attention Level: Selective Memory: Decreased recall of precautions;Decreased short-term memory Following Commands: Follows one step commands inconsistently;Follows one step commands with increased time Safety/Judgement: Decreased awareness of safety;Decreased awareness of deficits Awareness: Intellectual Problem Solving: Slow processing;Decreased initiation;Difficulty sequencing;Requires verbal cues;Requires tactile cues General Comments: Cues for maintaining precautions; pt having a full conversation with herself prior to me entering        Exercises      General Comments        Pertinent Vitals/Pain Pain Assessment: No/denies pain    Home Living  Prior Function            PT Goals (current goals can now be found in the care plan section) Acute Rehab PT Goals Patient Stated Goal: to go home Potential to Achieve  Goals: Fair Progress towards PT goals: Progressing toward goals    Frequency    Min 2X/week      PT Plan Frequency needs to be updated    Co-evaluation              AM-PAC PT "6 Clicks" Mobility   Outcome Measure  Help needed turning from your back to your side while in a flat bed without using bedrails?: None Help needed moving from lying on your back to sitting on the side of a flat bed without using bedrails?: A Little Help needed moving to and from a bed to a chair (including a wheelchair)?: A Little Help needed standing up from a chair using your arms (e.g., wheelchair or bedside chair)?: A Little Help needed to walk in hospital room?: A Little Help needed climbing 3-5 steps with a railing? : A Lot 6 Click Score: 18    End of Session Equipment Utilized During Treatment: Other (comment) (sling) Activity Tolerance: Patient tolerated treatment well Patient left: in chair;with call bell/phone within reach;with chair alarm set Nurse Communication: Mobility status PT Visit Diagnosis: Unsteadiness on feet (R26.81);Other abnormalities of gait and mobility (R26.89);Muscle weakness (generalized) (M62.81);Pain Pain - Right/Left: Left Pain - part of body: Shoulder     Time: 2500-3704 PT Time Calculation (min) (ACUTE ONLY): 22 min  Charges:  $Therapeutic Activity: 8-22 mins                     Wyona Almas, PT, DPT Acute Rehabilitation Services Pager [phone] Office [phone]    Deno Etienne 04/01/2021, 2:58 PM

## 2021-04-01 NOTE — Plan of Care (Signed)

## 2021-04-01 NOTE — TOC Transition Note (Signed)
Transition of Care [facility]) - CM/SW Discharge Note   Patient Details  Name: Holly Valenzuela MRN: [id number] Date of Birth: 1941-03-16  Transition of Care [facility]) CM/SW Contact:  Sable Feil, LCSW Phone Number: 04/01/2021, 3:52 PM   Clinical Narrative:  Patient medically stable for discharge and going to [facility] for short-term rehab. Discharge clinicals transmitted to facility and non-emergency ambulance transport arranged. Daughter [name] contacted [phone]) and informed regarding discharge.     Final next level of care: [place] [facility]) Barriers to Discharge: Barriers Resolved   Patient Goals and CMS Choice Patient states their goals for this hospitalization and ongoing recovery are:: Patient wants to return home after rehab CMS Medicare.gov Compare Post Acute Care list provided to:: Patient (Patient chose [facility]) Choice offered to / list presented to : Patient  Discharge Placement   Existing PASRR number confirmed : 03/30/21          Patient chooses bed at: [facility] Patient to be transferred to facility by: non-emergency ambulance transport Name of family member notified: Daughter [name] [phone] Patient and family notified of of transfer: 04/01/21  Discharge Plan and Services   Discharge Planning Services: CM Consult Post Acute Care Choice: [place]          DME Arranged: N/A DME Agency: NA       HH Arranged: RN, PT, OT, Nurse's Aide, Social Work [employer] Agency: [place] Date [place]: 03/29/21 Time Portal: 9191 Representative spoke with at [place]: [place]) Interventions  No SDOH interventions requested or needed at discharge   Readmission Risk Interventions Readmission Risk Prevention Plan 03/01/2020  Post Dischage Appt Not Complete  Appt Comments rec for SNF   Medication Screening Complete  Transportation Screening Complete  Some recent data might be hidden

## 2021-04-04 ENCOUNTER — Other Ambulatory Visit: Payer: Self-pay | Admitting: *Deleted

## 2021-04-04 DIAGNOSIS — R269 Unspecified abnormalities of gait and mobility: Secondary | ICD-10-CM | POA: Insufficient documentation

## 2021-04-04 NOTE — Patient Outreach (Addendum)
[facility]) Care Management  04/04/2021  NAVIE LAMOREAUX 1941-02-06 [id number]   [place] collaboration with [facility] staff on complex care patient referral  Mrs Holly Valenzuela was referred to [facility] on 03/24/21 for post hospital services by [facility] hospital liaison after request of the inpatient [facility] SW related to patient's daughter reference concerns for patient's ability to care for herself at home.  Outpatient follow up was requested for level of care resources/assistance.  Inpatient PT/OT recommended facility placement. Patient preferred discharged home. EPIC notes indicate with [place] home health RN , PT, OT Aide, SW  She returned to the hospital on 03/27/21 after becoming weak, poor intake, a fall leading to left shoulder nondisplaced fracture (placed in a sling)  Patient agreed to [facility] placement on 04/01/21  Collaboration with [facility] hospital liaison and post acute coordinator on 04/01/21 on case information related to patient and her daughter voiced concerns for level of care needs and particular skilled nursing facility (snf) Patient daughter [name] [phone]) lives in [place], patient lives alone without relatives near   Insurance blue cross and blue shield   Outreach to [employer] with attempt speak with facility [phone] No answer. THN RN CM left HIPAA [facility] Portability and Accountability Act) compliant voicemail message along with CM's contact info.  Collaboration with [facility] post acute staff [place]    Patient Active Problem List   Diagnosis Date Noted   Increased weakness when ambulating 03/30/2021   Weakness generalized 03/28/2021   Edema 03/19/2021   Colitis 03/22/2020   Syncope 02/28/2020   Essential hypertension 02/28/2020   Hyponatremia    Pain    Weakness    Protein-calorie malnutrition, severe ([place]) 04/19/2014   Sacral back pain    Failure to thrive in adult 04/18/2014   Tachycardia 04/18/2014    Vitamin D deficiency 04/18/2014   Hyperlipidemia 04/18/2014   Tobacco abuse 04/18/2014   Fall 04/17/2014   Sacral fracture, closed ([employer])    Pure hypercholesterolemia 01/16/2014   Esophageal reflux 01/16/2014   Other B-complex deficiencies 01/16/2014   Arthritis of right hip 01/12/2014   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   Syncope 11/21/2011   Ribs, multiple fractures 11/21/2011   Leukocytosis 11/21/2011   Depression 11/21/2011   Past Medical History:  Diagnosis Date   Arthritis    Basal cell adenocarcinoma    right hand   Complication of anesthesia    SEVERE nausea vomiting   COPD (chronic obstructive pulmonary disease) ([place])    Depression    no per pt 02-27-16   DJD (degenerative joint disease)    Endometrial cancer ([place])    Essential hypertension    Genital warts    Headache(784.0)    HX MIGRAINES   HLD (hyperlipidemia)    Hyperlipidemia    Hypertension    Migraine headache    Occular    Migraines    Osteoporosis hypetens   Recurrent UTI    Tobacco abuse    Vitamin B12 deficiency    Vitamin D deficiency      Plan [facility] RN CM will follow up within the next 10 business days with the facility staff and [facility] staff for updates   Josiah Nieto L. Lavina Hamman, RN, BSN, [place] Coordinator Office number [phone] Mobile number [phone]  Main THN number [phone] Fax number [phone]

## 2021-04-07 ENCOUNTER — Other Ambulatory Visit: Payer: Self-pay | Admitting: *Deleted

## 2021-04-07 NOTE — Patient Outreach (Signed)
Ms. Reek resides in [facility]. Update received from [facility] SW indicating member's daughter lives in [place]. Ms. Gfeller is from home alone.   SNF SW indicates transition plans are pending. Daughter has indicated she wants LTC.  However, SNF SW reports patient wants to return home.  Writer will plan outreach to daughter [name] to discuss transition plans.   Will continue to follow and update THN RNCM.    Marthenia Rolling, MSN, RN,BSN [place] Acute Care Coordinator [phone] [facility]) [phone]  (Toll free office)

## 2021-04-14 ENCOUNTER — Ambulatory Visit (INDEPENDENT_AMBULATORY_CARE_PROVIDER_SITE_OTHER): Payer: Medicare Other | Admitting: Orthopaedic Surgery

## 2021-04-14 ENCOUNTER — Ambulatory Visit: Payer: Self-pay

## 2021-04-14 ENCOUNTER — Encounter: Payer: Self-pay | Admitting: Orthopaedic Surgery

## 2021-04-14 ENCOUNTER — Other Ambulatory Visit: Payer: Self-pay

## 2021-04-14 DIAGNOSIS — M25512 Pain in left shoulder: Secondary | ICD-10-CM

## 2021-04-14 DIAGNOSIS — S42252D Displaced fracture of greater tuberosity of left humerus, subsequent encounter for fracture with routine healing: Secondary | ICD-10-CM | POA: Diagnosis not present

## 2021-04-14 NOTE — Progress Notes (Signed)
The patient is an 80 year old female who is a follow-up from emergency room after having mechanical fall.  She sustained a greater tuberosity fracture of her left shoulder.  She is staying in a skilled nursing facility as she recovers.  She has been coming in and out of her sling and moving her shoulder around without difficulty.  She says the pain is minimal.  I can easily put her shoulder through internal and external rotation and she can abduct it but it is painful to do so.  It is clinically well located.  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  This is something you definitely would treat nonoperative given her age and her mobility.  She seems to be tolerating this well.  I will have her only wear the sling for a week or 2 more.  She will avoid overhead activities.  I gave a note to the nursing cente recommending this.  She should not put any weight through her left shoulder.  We will see her back in 4 weeks with a single AP view of the left shoulder.

## 2021-04-15 ENCOUNTER — Other Ambulatory Visit: Payer: Self-pay | Admitting: *Deleted

## 2021-04-15 NOTE — Patient Outreach (Signed)
[place] Coordinator follow up. Ms. Defrancesco resides in [facility].   Telephone call made to Mrs. Dias [phone] to confirm she is going to stay long term at [facility]. No answer. Unable to leave voicemail message. Voicemail box full.   Telephone call made to daughter Margarita Rana [phone]. No answer. HIPAA compliant voicemail message requesting return call.   Will make [facility] RNCM aware of writer's unsuccessful attempts to reach member and daughter.   Will continue to follow while member resides in [facility] SN[facility].   Marthenia Rolling, MSN, RN,BSN [place] Acute Care Coordinator [phone] [facility]) [phone]  (Toll free office)

## 2021-04-15 NOTE — Patient Outreach (Signed)
[facility]) Care Management  04/15/2021  ALEYA DURNELL April 23, 1941 [id number]   [place] coordination- Remains at [facility]  Patient was discharge to [facility] on 04/01/21  Outreach to [facility] [phone] with attempt to reach the SW assisting patient  Answering staff indicate patient remains a resident of facility. Transferred to SNF SW No answer. THN RN CM left HIPAA [facility] Portability and Accountability Act) compliant voicemail message along with CM's contact info at the preferred number indicated in [place] with [facility] staff     Mrs MARIGOLD MOM was referred to [facility] on 03/24/21 for post hospital services by [facility] hospital liaison after request of the inpatient [facility] SW related to patient's daughter reference concerns for patient's ability to care for herself at home.  Outpatient follow up was requested for level of care resources/assistance.  Inpatient PT/OT recommended facility placement. Patient preferred discharged home. EPIC notes indicate with [place] home health RN , PT, OT Aide, SW   She returned to the hospital on 03/27/21 after becoming weak, poor intake, a fall leading to left shoulder nondisplaced fracture (placed in a sling)   Patient agreed to [facility] placement on 04/01/21  Collaboration with [facility] hospital liaison and post acute coordinator on 04/01/21 on case information related to patient and her daughter voiced concerns for level of care needs and particular skilled nursing facility (snf) Patient daughter [name] [phone]) lives in [place], patient lives alone without relatives near      Ken Caryl will continue to follow up with [facility] staff and close prn    Jordynn Marcella L. Lavina Hamman, RN, BSN, [place] Coordinator Office number [phone] Mobile number [phone]  Main THN number [phone] Fax number [phone]

## 2021-04-16 ENCOUNTER — Other Ambulatory Visit: Payer: Self-pay | Admitting: *Deleted

## 2021-04-16 NOTE — Patient Outreach (Signed)
[facility]) Care Management  04/16/2021  Holly Valenzuela May 28, 1941 [id number]   THN case closure   Holly remains at [facility] snf since her hospital discharge  on 04/01/21  Completed collaboration with [facility] staff on 04/15/21   Holly Valenzuela was referred to [facility] on 03/24/21 for post hospital services by [facility] hospital liaison after request of the inpatient [facility] SW related to Holly's daughter reference concerns for Holly's ability to care for herself at home.  Outpatient follow up was requested for level of care resources/assistance.  Inpatient PT/OT recommended facility placement. Holly preferred discharged home. EPIC notes indicate with [place] home health RN , PT, OT Aide, SW   She returned to the hospital on 03/27/21 after becoming weak, poor intake, a fall leading to left shoulder nondisplaced fracture (placed in a sling)   Holly agreed to [facility] placement on 04/01/21  Collaboration with [facility] hospital liaison and post acute coordinator on 04/01/21 on case information related to Holly and her daughter voiced concerns for level of care needs and particular skilled nursing facility (snf) Holly Valenzuela [phone]) lives in [place], Holly lives alone without relatives near    Plan Case closure as Holly has external care management services at [employer] skilled nursing facility (snf)   Holly Gauger L. Lavina Hamman, RN, BSN, [place] Coordinator Office number [phone] Main [facility] number [phone] Fax number [phone]

## 2021-05-01 ENCOUNTER — Other Ambulatory Visit: Payer: Self-pay | Admitting: *Deleted

## 2021-05-01 NOTE — Patient Outreach (Signed)
THN Post- Acute Care Coordinator follow up. Per [facility] [name] resides in [facility]. Member screened for potential [facility] Care Management needs.   Voicemail message left for [facility] SW to inquire about transition plans and request return call.   Will continue to follow while member resides in [facility].    Holly Rolling, MSN, RN,BSN [place] Acute Care Coordinator [phone] [facility]) [phone]  (Toll free office)

## 2021-05-06 ENCOUNTER — Other Ambulatory Visit: Payer: Self-pay | Admitting: *Deleted

## 2021-05-06 NOTE — Patient Outreach (Signed)
[place] Coordinator follow up. Mrs. Buswell resides in [facility].  Spoke with [facility] SW indicating transition plans are still pending. States Ms. Brackens wants to return home but member's daughter wants her to stay LTC.   Writer will continue to follow for potential [facility] Care Management services.    Marthenia Rolling, MSN, RN,BSN [place] Acute Care Coordinator [phone] [facility]) [phone]  (Toll free office)

## 2021-05-12 ENCOUNTER — Ambulatory Visit: Payer: Medicare Other | Admitting: Orthopaedic Surgery

## 2021-05-29 ENCOUNTER — Ambulatory Visit (INDEPENDENT_AMBULATORY_CARE_PROVIDER_SITE_OTHER): Payer: Medicare Other

## 2021-05-29 ENCOUNTER — Encounter: Payer: Self-pay | Admitting: Physician Assistant

## 2021-05-29 ENCOUNTER — Ambulatory Visit (INDEPENDENT_AMBULATORY_CARE_PROVIDER_SITE_OTHER): Payer: Medicare Other | Admitting: Physician Assistant

## 2021-05-29 ENCOUNTER — Other Ambulatory Visit: Payer: Self-pay

## 2021-05-29 DIAGNOSIS — M25552 Pain in left hip: Secondary | ICD-10-CM

## 2021-05-29 DIAGNOSIS — S42252D Displaced fracture of greater tuberosity of left humerus, subsequent encounter for fracture with routine healing: Secondary | ICD-10-CM | POA: Diagnosis not present

## 2021-05-29 NOTE — Progress Notes (Signed)
Office Visit Note   Patient: Holly Valenzuela           Date of Birth: 25-Jun-1940           MRN: [id number] Visit Date: 05/29/2021              Requested by: Holly Infante, MD [address] [place],  [place] [address] PCP: Holly Infante, MD   Assessment & Plan: Visit Diagnoses:  1. Pain in left hip   2. Closed displaced fracture of greater tuberosity of left humerus with routine healing, subsequent encounter     Plan:  She can discontinue the sling on the left arm.  Recommend therapy to work on range of motion strengthening left shoulder.  Regards to her left lower extremity she is weightbearing as tolerated.  She will follow-up with [place] in 1 month to see how she is doing recommend AP, Y view and axillary view left shoulder at that time.  Follow-Up Instructions: Return in about 4 weeks (around 06/26/2021) for Radiographs.   Orders:  Orders Placed This Encounter  Procedures   XR Shoulder 1V Left   XR HIP UNILAT W OR W/O PELVIS 2-3 VIEWS LEFT   No orders of the defined types were placed in this encounter.     Procedures: No procedures performed   Clinical Data: No additional findings.   Subjective: Chief Complaint  Patient presents with   Left Shoulder - Follow-up, Fracture   Left Hip - Pain    HPI Holly Valenzuela returns today follow-up of her left shoulder greater tuberosity fracture she is now approximately 2 months status post injury.  She states she having difficulty lifting her arm.  She is left-handed.  She has been having some pain with walking in her left hip points to the lateral side of the hip.  No new injuries.  Review of Systems See HPI  Objective: Vital Signs: There were no vitals taken for this visit.  Physical Exam Pulmonary:     Effort: Pulmonary effort is normal.  Neurological:     Mental Status: She is alert and oriented to person, place, and time.  Psychiatric:        Mood and Affect: Mood normal.    Ortho Exam Left shoulder limited  forward flexion actively passively angry about 160 degrees of forward flexion.  Stiffness with external and internal rotation.  No testing of the rotator cuff today. Left hip full range of motion without pain.  Nontender over the left trochanteric region. Specialty Comments:  No specialty comments available.  Imaging: XR HIP UNILAT W OR W/O PELVIS 2-3 VIEWS LEFT  Result Date: 05/29/2021 AP pelvis and lateral view left hip: Bilateral hips well located.  No acute fractures.  Left hip joint overall well-maintained.  XR Shoulder 1V Left  Result Date: 05/29/2021 Left shoulder AP view: Greater tuberosity fragment remains unchanged in overall position.  Interval callus formation seen.  Shoulders well located.  No other fractures identified.    PMFS History: Patient Active Problem List   Diagnosis Date Noted   Gait abnormality 04/04/2021   Increased weakness when ambulating 03/30/2021   Weakness generalized 03/28/2021   Edema 03/19/2021   Colitis 03/22/2020   Syncope 02/28/2020   Hyponatremia    Chronic obstructive pulmonary disease ([place]) 03/16/2018   Hardening of the aorta (main artery of the heart) ([place]) 03/16/2018   Abnormal results of liver function studies 03/08/2018   Asymptomatic microscopic hematuria 01/26/2017   Constipation 01/26/2017   Malaise 10/06/2016  Benign paroxysmal positional vertigo 07/25/2014   Urinary tract infectious disease 07/25/2014   Pain in left leg 05/07/2014   Pain    Weakness    Protein-calorie malnutrition, severe ([place]) 04/19/2014   Failure to thrive in adult 04/18/2014   Tachycardia 04/18/2014   Hyperlipidemia 04/18/2014   Fall 04/17/2014   Sacral fracture, closed ([place])    Low back pain 04/12/2014   Encounter for immunization 02/20/2014   Pure hypercholesterolemia 01/16/2014   Esophageal reflux 01/16/2014   Other B-complex deficiencies 01/16/2014   Arthritis of right hip 01/12/2014   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    Dysuria 11/13/2013   Vitamin B deficiency 10/31/2012   Polyp of colon 10/31/2012   Underimmunization status 02/19/2012   Syncope 11/21/2011   Ribs, multiple fractures 11/21/2011   Leukocytosis 11/21/2011   Depression 11/21/2011   Nutritional anemia 08/19/2011   Tobacco user 05/21/2009   Essential hypertension 05/21/2009   Age-related osteoporosis without current pathological fracture 05/21/2009   Cholelithiasis without obstruction 05/21/2009   Major depression, single episode 05/21/2009   Malignant neoplasm of endometrium ([place]) 05/21/2009   Migraine 05/21/2009   Past Medical History:  Diagnosis Date   Arthritis    Basal cell adenocarcinoma    right hand   Complication of anesthesia    SEVERE nausea vomiting   COPD (chronic obstructive pulmonary disease) (H[facility])    Depression    no per pt 02-27-16   DJD (degenerative joint disease)    Endometrial cancer ([place])    Essential hypertension    Genital warts    Headache(784.0)    HX MIGRAINES   HLD (hyperlipidemia)    Hyperlipidemia    Hypertension    Migraine headache    Occular    Migraines    Osteoporosis hypetens   Recurrent UTI    Tobacco abuse    Vitamin B12 deficiency    Vitamin D deficiency     Family History  Problem Relation Age of Onset   Alzheimer's disease Mother    Heart disease Mother    Stroke Father    Colon cancer Neg Hx    Esophageal cancer Neg Hx    Stomach cancer Neg Hx    Rectal cancer Neg Hx     Past Surgical History:  Procedure Laterality Date   ABDOMINAL HYSTERECTOMY     APPENDECTOMY     CARPAL TUNNEL RELEASE     CESAREAN SECTION     COLONOSCOPY     KYPHOPLASTY     THYROID SURGERY     nodule removal   TOTAL HIP ARTHROPLASTY Right 01/12/2014   Procedure: RIGHT TOTAL HIP ARTHROPLASTY ANTERIOR APPROACH;  Surgeon: Mcarthur Rossetti, MD;  Location: [facility];  Service: Orthopedics;  Laterality: Right;   VERTEBROPLASTY     Social History   Occupational History   Not on file  Tobacco  Use   Smoking status: Every Day    Packs/day: 0.50    Years: 50.00    Pack years: 25.00    Types: Cigarettes   Smokeless tobacco: Never  Vaping Use   Vaping Use: Never used  Substance and Sexual Activity   Alcohol use: Never   Drug use: No   Sexual activity: Not Currently

## 2021-05-30 ENCOUNTER — Telehealth: Payer: Self-pay | Admitting: Physician Assistant

## 2021-05-30 NOTE — Telephone Encounter (Signed)
Pt therapist called from the nursing home and is needing the WB status for pt left shoulder.   CB [phone]

## 2021-05-30 NOTE — Telephone Encounter (Signed)
Please advise 

## 2021-05-30 NOTE — Telephone Encounter (Signed)
Called and advised.

## 2021-06-04 ENCOUNTER — Other Ambulatory Visit: Payer: Self-pay | Admitting: *Deleted

## 2021-06-04 NOTE — Patient Outreach (Signed)
[place] Coordinator follow up. Per [facility] Ms. Zentz remains in [facility].   Spoke with [facility] SW who indicates transition plan for Ms. Peyser is LTC.   Writer to sign off. No identifiable [facility] Care Management needs at this time.     Marthenia Rolling, MSN, RN,BSN [place] Acute Care Coordinator [phone] [facility]) [phone]  (Toll free office)

## 2021-06-20 ENCOUNTER — Other Ambulatory Visit (HOSPITAL_COMMUNITY): Payer: Self-pay

## 2021-06-26 ENCOUNTER — Ambulatory Visit: Payer: Medicare Other | Admitting: Physician Assistant

## 2021-09-30 DIAGNOSIS — F331 Major depressive disorder, recurrent, moderate: Secondary | ICD-10-CM | POA: Diagnosis not present

## 2021-09-30 DIAGNOSIS — F419 Anxiety disorder, unspecified: Secondary | ICD-10-CM | POA: Diagnosis not present

## 2021-10-06 DIAGNOSIS — I10 Essential (primary) hypertension: Secondary | ICD-10-CM | POA: Diagnosis not present

## 2021-10-06 DIAGNOSIS — E46 Unspecified protein-calorie malnutrition: Secondary | ICD-10-CM | POA: Diagnosis not present

## 2021-10-06 DIAGNOSIS — K219 Gastro-esophageal reflux disease without esophagitis: Secondary | ICD-10-CM | POA: Diagnosis not present

## 2021-10-06 DIAGNOSIS — F419 Anxiety disorder, unspecified: Secondary | ICD-10-CM | POA: Diagnosis not present

## 2021-10-15 DIAGNOSIS — I10 Essential (primary) hypertension: Secondary | ICD-10-CM | POA: Diagnosis not present

## 2021-10-15 DIAGNOSIS — K219 Gastro-esophageal reflux disease without esophagitis: Secondary | ICD-10-CM | POA: Diagnosis not present

## 2021-10-15 DIAGNOSIS — F32A Depression, unspecified: Secondary | ICD-10-CM | POA: Diagnosis not present

## 2021-10-15 DIAGNOSIS — E46 Unspecified protein-calorie malnutrition: Secondary | ICD-10-CM | POA: Diagnosis not present

## 2021-10-17 DIAGNOSIS — I10 Essential (primary) hypertension: Secondary | ICD-10-CM | POA: Diagnosis not present

## 2021-10-17 DIAGNOSIS — R531 Weakness: Secondary | ICD-10-CM | POA: Diagnosis not present

## 2021-10-17 DIAGNOSIS — J449 Chronic obstructive pulmonary disease, unspecified: Secondary | ICD-10-CM | POA: Diagnosis not present

## 2021-10-17 DIAGNOSIS — E43 Unspecified severe protein-calorie malnutrition: Secondary | ICD-10-CM | POA: Diagnosis not present

## 2021-10-23 DIAGNOSIS — F419 Anxiety disorder, unspecified: Secondary | ICD-10-CM | POA: Diagnosis not present

## 2021-10-23 DIAGNOSIS — F331 Major depressive disorder, recurrent, moderate: Secondary | ICD-10-CM | POA: Diagnosis not present

## 2021-10-25 DIAGNOSIS — F419 Anxiety disorder, unspecified: Secondary | ICD-10-CM | POA: Diagnosis not present

## 2021-10-25 DIAGNOSIS — F32A Depression, unspecified: Secondary | ICD-10-CM | POA: Diagnosis not present

## 2021-10-25 DIAGNOSIS — I10 Essential (primary) hypertension: Secondary | ICD-10-CM | POA: Diagnosis not present

## 2021-10-25 DIAGNOSIS — K219 Gastro-esophageal reflux disease without esophagitis: Secondary | ICD-10-CM | POA: Diagnosis not present

## 2021-10-28 DIAGNOSIS — F331 Major depressive disorder, recurrent, moderate: Secondary | ICD-10-CM | POA: Diagnosis not present

## 2021-10-28 DIAGNOSIS — F419 Anxiety disorder, unspecified: Secondary | ICD-10-CM | POA: Diagnosis not present

## 2021-10-29 DIAGNOSIS — E46 Unspecified protein-calorie malnutrition: Secondary | ICD-10-CM | POA: Diagnosis not present

## 2021-10-29 DIAGNOSIS — K219 Gastro-esophageal reflux disease without esophagitis: Secondary | ICD-10-CM | POA: Diagnosis not present

## 2021-10-29 DIAGNOSIS — R531 Weakness: Secondary | ICD-10-CM | POA: Diagnosis not present

## 2021-10-29 DIAGNOSIS — I10 Essential (primary) hypertension: Secondary | ICD-10-CM | POA: Diagnosis not present

## 2021-10-29 DIAGNOSIS — F419 Anxiety disorder, unspecified: Secondary | ICD-10-CM | POA: Diagnosis not present

## 2021-11-04 DIAGNOSIS — R42 Dizziness and giddiness: Secondary | ICD-10-CM | POA: Diagnosis not present

## 2021-11-04 DIAGNOSIS — K219 Gastro-esophageal reflux disease without esophagitis: Secondary | ICD-10-CM | POA: Diagnosis not present

## 2021-11-04 DIAGNOSIS — F419 Anxiety disorder, unspecified: Secondary | ICD-10-CM | POA: Diagnosis not present

## 2021-11-04 DIAGNOSIS — I10 Essential (primary) hypertension: Secondary | ICD-10-CM | POA: Diagnosis not present

## 2021-11-04 DIAGNOSIS — E46 Unspecified protein-calorie malnutrition: Secondary | ICD-10-CM | POA: Diagnosis not present

## 2021-11-05 DIAGNOSIS — D649 Anemia, unspecified: Secondary | ICD-10-CM | POA: Diagnosis not present

## 2021-11-05 DIAGNOSIS — R509 Fever, unspecified: Secondary | ICD-10-CM | POA: Diagnosis not present

## 2021-11-21 DIAGNOSIS — J449 Chronic obstructive pulmonary disease, unspecified: Secondary | ICD-10-CM | POA: Diagnosis not present

## 2021-11-21 DIAGNOSIS — E43 Unspecified severe protein-calorie malnutrition: Secondary | ICD-10-CM | POA: Diagnosis not present

## 2021-11-21 DIAGNOSIS — I10 Essential (primary) hypertension: Secondary | ICD-10-CM | POA: Diagnosis not present

## 2021-11-21 DIAGNOSIS — R531 Weakness: Secondary | ICD-10-CM | POA: Diagnosis not present

## 2021-11-25 DIAGNOSIS — F419 Anxiety disorder, unspecified: Secondary | ICD-10-CM | POA: Diagnosis not present

## 2021-11-25 DIAGNOSIS — F331 Major depressive disorder, recurrent, moderate: Secondary | ICD-10-CM | POA: Diagnosis not present

## 2021-11-27 DIAGNOSIS — F419 Anxiety disorder, unspecified: Secondary | ICD-10-CM | POA: Diagnosis not present

## 2021-11-27 DIAGNOSIS — E871 Hypo-osmolality and hyponatremia: Secondary | ICD-10-CM | POA: Diagnosis not present

## 2021-11-27 DIAGNOSIS — K219 Gastro-esophageal reflux disease without esophagitis: Secondary | ICD-10-CM | POA: Diagnosis not present

## 2021-11-27 DIAGNOSIS — E46 Unspecified protein-calorie malnutrition: Secondary | ICD-10-CM | POA: Diagnosis not present

## 2021-11-27 DIAGNOSIS — I10 Essential (primary) hypertension: Secondary | ICD-10-CM | POA: Diagnosis not present

## 2021-11-30 DIAGNOSIS — I10 Essential (primary) hypertension: Secondary | ICD-10-CM | POA: Diagnosis not present

## 2021-11-30 DIAGNOSIS — K219 Gastro-esophageal reflux disease without esophagitis: Secondary | ICD-10-CM | POA: Diagnosis not present

## 2021-11-30 DIAGNOSIS — F419 Anxiety disorder, unspecified: Secondary | ICD-10-CM | POA: Diagnosis not present

## 2021-11-30 DIAGNOSIS — R531 Weakness: Secondary | ICD-10-CM | POA: Diagnosis not present

## 2021-11-30 DIAGNOSIS — F17290 Nicotine dependence, other tobacco product, uncomplicated: Secondary | ICD-10-CM | POA: Diagnosis not present

## 2021-11-30 DIAGNOSIS — F32A Depression, unspecified: Secondary | ICD-10-CM | POA: Diagnosis not present

## 2021-12-08 DIAGNOSIS — I10 Essential (primary) hypertension: Secondary | ICD-10-CM | POA: Diagnosis not present

## 2021-12-08 DIAGNOSIS — E46 Unspecified protein-calorie malnutrition: Secondary | ICD-10-CM | POA: Diagnosis not present

## 2021-12-08 DIAGNOSIS — F419 Anxiety disorder, unspecified: Secondary | ICD-10-CM | POA: Diagnosis not present

## 2021-12-08 DIAGNOSIS — R42 Dizziness and giddiness: Secondary | ICD-10-CM | POA: Diagnosis not present

## 2021-12-08 DIAGNOSIS — K219 Gastro-esophageal reflux disease without esophagitis: Secondary | ICD-10-CM | POA: Diagnosis not present

## 2021-12-11 DIAGNOSIS — E785 Hyperlipidemia, unspecified: Secondary | ICD-10-CM | POA: Diagnosis not present

## 2021-12-18 DIAGNOSIS — K219 Gastro-esophageal reflux disease without esophagitis: Secondary | ICD-10-CM | POA: Diagnosis not present

## 2021-12-18 DIAGNOSIS — F419 Anxiety disorder, unspecified: Secondary | ICD-10-CM | POA: Diagnosis not present

## 2021-12-18 DIAGNOSIS — E46 Unspecified protein-calorie malnutrition: Secondary | ICD-10-CM | POA: Diagnosis not present

## 2021-12-18 DIAGNOSIS — I10 Essential (primary) hypertension: Secondary | ICD-10-CM | POA: Diagnosis not present

## 2021-12-19 DIAGNOSIS — R531 Weakness: Secondary | ICD-10-CM | POA: Diagnosis not present

## 2021-12-19 DIAGNOSIS — E43 Unspecified severe protein-calorie malnutrition: Secondary | ICD-10-CM | POA: Diagnosis not present

## 2021-12-19 DIAGNOSIS — J449 Chronic obstructive pulmonary disease, unspecified: Secondary | ICD-10-CM | POA: Diagnosis not present

## 2021-12-19 DIAGNOSIS — I10 Essential (primary) hypertension: Secondary | ICD-10-CM | POA: Diagnosis not present

## 2021-12-23 DIAGNOSIS — K219 Gastro-esophageal reflux disease without esophagitis: Secondary | ICD-10-CM | POA: Diagnosis not present

## 2021-12-23 DIAGNOSIS — F331 Major depressive disorder, recurrent, moderate: Secondary | ICD-10-CM | POA: Diagnosis not present

## 2021-12-23 DIAGNOSIS — E782 Mixed hyperlipidemia: Secondary | ICD-10-CM | POA: Diagnosis not present

## 2021-12-23 DIAGNOSIS — I10 Essential (primary) hypertension: Secondary | ICD-10-CM | POA: Diagnosis not present

## 2021-12-23 DIAGNOSIS — F419 Anxiety disorder, unspecified: Secondary | ICD-10-CM | POA: Diagnosis not present

## 2021-12-23 DIAGNOSIS — G3184 Mild cognitive impairment, so stated: Secondary | ICD-10-CM | POA: Diagnosis not present

## 2021-12-30 DIAGNOSIS — F419 Anxiety disorder, unspecified: Secondary | ICD-10-CM | POA: Diagnosis not present

## 2021-12-30 DIAGNOSIS — K219 Gastro-esophageal reflux disease without esophagitis: Secondary | ICD-10-CM | POA: Diagnosis not present

## 2021-12-30 DIAGNOSIS — R197 Diarrhea, unspecified: Secondary | ICD-10-CM | POA: Diagnosis not present

## 2021-12-30 DIAGNOSIS — I10 Essential (primary) hypertension: Secondary | ICD-10-CM | POA: Diagnosis not present

## 2021-12-31 DIAGNOSIS — E46 Unspecified protein-calorie malnutrition: Secondary | ICD-10-CM | POA: Diagnosis not present

## 2021-12-31 DIAGNOSIS — K219 Gastro-esophageal reflux disease without esophagitis: Secondary | ICD-10-CM | POA: Diagnosis not present

## 2021-12-31 DIAGNOSIS — I10 Essential (primary) hypertension: Secondary | ICD-10-CM | POA: Diagnosis not present

## 2021-12-31 DIAGNOSIS — F419 Anxiety disorder, unspecified: Secondary | ICD-10-CM | POA: Diagnosis not present

## 2022-01-07 DIAGNOSIS — K219 Gastro-esophageal reflux disease without esophagitis: Secondary | ICD-10-CM | POA: Diagnosis not present

## 2022-01-07 DIAGNOSIS — F419 Anxiety disorder, unspecified: Secondary | ICD-10-CM | POA: Diagnosis not present

## 2022-01-07 DIAGNOSIS — E46 Unspecified protein-calorie malnutrition: Secondary | ICD-10-CM | POA: Diagnosis not present

## 2022-01-07 DIAGNOSIS — I10 Essential (primary) hypertension: Secondary | ICD-10-CM | POA: Diagnosis not present

## 2022-01-09 DIAGNOSIS — E46 Unspecified protein-calorie malnutrition: Secondary | ICD-10-CM | POA: Diagnosis not present

## 2022-01-09 DIAGNOSIS — R531 Weakness: Secondary | ICD-10-CM | POA: Diagnosis not present

## 2022-01-09 DIAGNOSIS — F419 Anxiety disorder, unspecified: Secondary | ICD-10-CM | POA: Diagnosis not present

## 2022-01-09 DIAGNOSIS — K219 Gastro-esophageal reflux disease without esophagitis: Secondary | ICD-10-CM | POA: Diagnosis not present

## 2022-01-11 DIAGNOSIS — K5900 Constipation, unspecified: Secondary | ICD-10-CM | POA: Diagnosis not present

## 2022-01-11 DIAGNOSIS — F32A Depression, unspecified: Secondary | ICD-10-CM | POA: Diagnosis not present

## 2022-01-11 DIAGNOSIS — E46 Unspecified protein-calorie malnutrition: Secondary | ICD-10-CM | POA: Diagnosis not present

## 2022-01-11 DIAGNOSIS — R197 Diarrhea, unspecified: Secondary | ICD-10-CM | POA: Diagnosis not present

## 2022-01-11 DIAGNOSIS — R531 Weakness: Secondary | ICD-10-CM | POA: Diagnosis not present

## 2022-01-11 DIAGNOSIS — F17290 Nicotine dependence, other tobacco product, uncomplicated: Secondary | ICD-10-CM | POA: Diagnosis not present

## 2022-01-11 DIAGNOSIS — F419 Anxiety disorder, unspecified: Secondary | ICD-10-CM | POA: Diagnosis not present

## 2022-01-11 DIAGNOSIS — K219 Gastro-esophageal reflux disease without esophagitis: Secondary | ICD-10-CM | POA: Diagnosis not present

## 2022-01-16 DIAGNOSIS — E871 Hypo-osmolality and hyponatremia: Secondary | ICD-10-CM | POA: Diagnosis not present

## 2022-01-16 DIAGNOSIS — E43 Unspecified severe protein-calorie malnutrition: Secondary | ICD-10-CM | POA: Diagnosis not present

## 2022-01-16 DIAGNOSIS — I10 Essential (primary) hypertension: Secondary | ICD-10-CM | POA: Diagnosis not present

## 2022-01-16 DIAGNOSIS — R531 Weakness: Secondary | ICD-10-CM | POA: Diagnosis not present

## 2022-01-16 DIAGNOSIS — F322 Major depressive disorder, single episode, severe without psychotic features: Secondary | ICD-10-CM | POA: Diagnosis not present

## 2022-01-16 DIAGNOSIS — F0390 Unspecified dementia without behavioral disturbance: Secondary | ICD-10-CM | POA: Diagnosis not present

## 2022-01-16 DIAGNOSIS — J449 Chronic obstructive pulmonary disease, unspecified: Secondary | ICD-10-CM | POA: Diagnosis not present

## 2022-01-20 DIAGNOSIS — K219 Gastro-esophageal reflux disease without esophagitis: Secondary | ICD-10-CM | POA: Diagnosis not present

## 2022-01-20 DIAGNOSIS — F331 Major depressive disorder, recurrent, moderate: Secondary | ICD-10-CM | POA: Diagnosis not present

## 2022-01-20 DIAGNOSIS — E46 Unspecified protein-calorie malnutrition: Secondary | ICD-10-CM | POA: Diagnosis not present

## 2022-01-20 DIAGNOSIS — I10 Essential (primary) hypertension: Secondary | ICD-10-CM | POA: Diagnosis not present

## 2022-01-20 DIAGNOSIS — F419 Anxiety disorder, unspecified: Secondary | ICD-10-CM | POA: Diagnosis not present

## 2022-01-20 DIAGNOSIS — G3184 Mild cognitive impairment, so stated: Secondary | ICD-10-CM | POA: Diagnosis not present

## 2022-01-21 DIAGNOSIS — N390 Urinary tract infection, site not specified: Secondary | ICD-10-CM | POA: Diagnosis not present

## 2022-01-22 DIAGNOSIS — I10 Essential (primary) hypertension: Secondary | ICD-10-CM | POA: Diagnosis not present

## 2022-01-22 DIAGNOSIS — F419 Anxiety disorder, unspecified: Secondary | ICD-10-CM | POA: Diagnosis not present

## 2022-01-22 DIAGNOSIS — E871 Hypo-osmolality and hyponatremia: Secondary | ICD-10-CM | POA: Diagnosis not present

## 2022-01-22 DIAGNOSIS — N390 Urinary tract infection, site not specified: Secondary | ICD-10-CM | POA: Diagnosis not present

## 2022-01-29 DIAGNOSIS — I10 Essential (primary) hypertension: Secondary | ICD-10-CM | POA: Diagnosis not present

## 2022-01-29 DIAGNOSIS — F411 Generalized anxiety disorder: Secondary | ICD-10-CM | POA: Diagnosis not present

## 2022-01-29 DIAGNOSIS — K219 Gastro-esophageal reflux disease without esophagitis: Secondary | ICD-10-CM | POA: Diagnosis not present

## 2022-01-29 DIAGNOSIS — E441 Mild protein-calorie malnutrition: Secondary | ICD-10-CM | POA: Diagnosis not present

## 2022-02-03 DIAGNOSIS — F419 Anxiety disorder, unspecified: Secondary | ICD-10-CM | POA: Diagnosis not present

## 2022-02-03 DIAGNOSIS — G3184 Mild cognitive impairment, so stated: Secondary | ICD-10-CM | POA: Diagnosis not present

## 2022-02-04 DIAGNOSIS — F411 Generalized anxiety disorder: Secondary | ICD-10-CM | POA: Diagnosis not present

## 2022-02-04 DIAGNOSIS — I10 Essential (primary) hypertension: Secondary | ICD-10-CM | POA: Diagnosis not present

## 2022-02-04 DIAGNOSIS — K219 Gastro-esophageal reflux disease without esophagitis: Secondary | ICD-10-CM | POA: Diagnosis not present

## 2022-02-04 DIAGNOSIS — U071 COVID-19: Secondary | ICD-10-CM | POA: Diagnosis not present

## 2022-02-17 DIAGNOSIS — G3184 Mild cognitive impairment, so stated: Secondary | ICD-10-CM | POA: Diagnosis not present

## 2022-02-17 DIAGNOSIS — F331 Major depressive disorder, recurrent, moderate: Secondary | ICD-10-CM | POA: Diagnosis not present

## 2022-02-17 DIAGNOSIS — F419 Anxiety disorder, unspecified: Secondary | ICD-10-CM | POA: Diagnosis not present

## 2022-02-20 DIAGNOSIS — R531 Weakness: Secondary | ICD-10-CM | POA: Diagnosis not present

## 2022-02-20 DIAGNOSIS — I10 Essential (primary) hypertension: Secondary | ICD-10-CM | POA: Diagnosis not present

## 2022-02-20 DIAGNOSIS — E43 Unspecified severe protein-calorie malnutrition: Secondary | ICD-10-CM | POA: Diagnosis not present

## 2022-02-20 DIAGNOSIS — J449 Chronic obstructive pulmonary disease, unspecified: Secondary | ICD-10-CM | POA: Diagnosis not present

## 2022-02-22 DIAGNOSIS — U071 COVID-19: Secondary | ICD-10-CM | POA: Diagnosis not present

## 2022-02-22 DIAGNOSIS — F32A Depression, unspecified: Secondary | ICD-10-CM | POA: Diagnosis not present

## 2022-02-22 DIAGNOSIS — K5900 Constipation, unspecified: Secondary | ICD-10-CM | POA: Diagnosis not present

## 2022-02-22 DIAGNOSIS — R531 Weakness: Secondary | ICD-10-CM | POA: Diagnosis not present

## 2022-02-22 DIAGNOSIS — I10 Essential (primary) hypertension: Secondary | ICD-10-CM | POA: Diagnosis not present

## 2022-02-22 DIAGNOSIS — F411 Generalized anxiety disorder: Secondary | ICD-10-CM | POA: Diagnosis not present

## 2022-02-22 DIAGNOSIS — E441 Mild protein-calorie malnutrition: Secondary | ICD-10-CM | POA: Diagnosis not present

## 2022-02-22 DIAGNOSIS — K219 Gastro-esophageal reflux disease without esophagitis: Secondary | ICD-10-CM | POA: Diagnosis not present

## 2022-03-05 DIAGNOSIS — E441 Mild protein-calorie malnutrition: Secondary | ICD-10-CM | POA: Diagnosis not present

## 2022-03-05 DIAGNOSIS — F411 Generalized anxiety disorder: Secondary | ICD-10-CM | POA: Diagnosis not present

## 2022-03-05 DIAGNOSIS — I10 Essential (primary) hypertension: Secondary | ICD-10-CM | POA: Diagnosis not present

## 2022-03-05 DIAGNOSIS — K219 Gastro-esophageal reflux disease without esophagitis: Secondary | ICD-10-CM | POA: Diagnosis not present

## 2022-03-17 DIAGNOSIS — F331 Major depressive disorder, recurrent, moderate: Secondary | ICD-10-CM | POA: Diagnosis not present

## 2022-03-17 DIAGNOSIS — F419 Anxiety disorder, unspecified: Secondary | ICD-10-CM | POA: Diagnosis not present

## 2022-03-17 DIAGNOSIS — G3184 Mild cognitive impairment, so stated: Secondary | ICD-10-CM | POA: Diagnosis not present

## 2022-03-20 DIAGNOSIS — E43 Unspecified severe protein-calorie malnutrition: Secondary | ICD-10-CM | POA: Diagnosis not present

## 2022-03-20 DIAGNOSIS — J449 Chronic obstructive pulmonary disease, unspecified: Secondary | ICD-10-CM | POA: Diagnosis not present

## 2022-03-20 DIAGNOSIS — R531 Weakness: Secondary | ICD-10-CM | POA: Diagnosis not present

## 2022-03-20 DIAGNOSIS — I10 Essential (primary) hypertension: Secondary | ICD-10-CM | POA: Diagnosis not present

## 2022-03-31 DIAGNOSIS — F4322 Adjustment disorder with anxiety: Secondary | ICD-10-CM | POA: Diagnosis not present

## 2022-04-03 DIAGNOSIS — I10 Essential (primary) hypertension: Secondary | ICD-10-CM | POA: Diagnosis not present

## 2022-04-03 DIAGNOSIS — F411 Generalized anxiety disorder: Secondary | ICD-10-CM | POA: Diagnosis not present

## 2022-04-03 DIAGNOSIS — E441 Mild protein-calorie malnutrition: Secondary | ICD-10-CM | POA: Diagnosis not present

## 2022-04-03 DIAGNOSIS — K219 Gastro-esophageal reflux disease without esophagitis: Secondary | ICD-10-CM | POA: Diagnosis not present

## 2022-04-05 DIAGNOSIS — K219 Gastro-esophageal reflux disease without esophagitis: Secondary | ICD-10-CM | POA: Diagnosis not present

## 2022-04-05 DIAGNOSIS — R531 Weakness: Secondary | ICD-10-CM | POA: Diagnosis not present

## 2022-04-05 DIAGNOSIS — F32A Depression, unspecified: Secondary | ICD-10-CM | POA: Diagnosis not present

## 2022-04-05 DIAGNOSIS — K5900 Constipation, unspecified: Secondary | ICD-10-CM | POA: Diagnosis not present

## 2022-04-05 DIAGNOSIS — F17290 Nicotine dependence, other tobacco product, uncomplicated: Secondary | ICD-10-CM | POA: Diagnosis not present

## 2022-04-05 DIAGNOSIS — I10 Essential (primary) hypertension: Secondary | ICD-10-CM | POA: Diagnosis not present

## 2022-04-05 DIAGNOSIS — E441 Mild protein-calorie malnutrition: Secondary | ICD-10-CM | POA: Diagnosis not present

## 2022-04-05 DIAGNOSIS — F411 Generalized anxiety disorder: Secondary | ICD-10-CM | POA: Diagnosis not present

## 2022-04-08 DIAGNOSIS — I10 Essential (primary) hypertension: Secondary | ICD-10-CM | POA: Diagnosis not present

## 2022-04-08 DIAGNOSIS — F411 Generalized anxiety disorder: Secondary | ICD-10-CM | POA: Diagnosis not present

## 2022-04-08 DIAGNOSIS — E441 Mild protein-calorie malnutrition: Secondary | ICD-10-CM | POA: Diagnosis not present

## 2022-04-08 DIAGNOSIS — K219 Gastro-esophageal reflux disease without esophagitis: Secondary | ICD-10-CM | POA: Diagnosis not present

## 2022-04-10 DIAGNOSIS — E43 Unspecified severe protein-calorie malnutrition: Secondary | ICD-10-CM | POA: Diagnosis not present

## 2022-04-10 DIAGNOSIS — J449 Chronic obstructive pulmonary disease, unspecified: Secondary | ICD-10-CM | POA: Diagnosis not present

## 2022-04-10 DIAGNOSIS — R531 Weakness: Secondary | ICD-10-CM | POA: Diagnosis not present

## 2022-04-10 DIAGNOSIS — I10 Essential (primary) hypertension: Secondary | ICD-10-CM | POA: Diagnosis not present

## 2022-04-12 DIAGNOSIS — F411 Generalized anxiety disorder: Secondary | ICD-10-CM | POA: Diagnosis not present

## 2022-04-12 DIAGNOSIS — K219 Gastro-esophageal reflux disease without esophagitis: Secondary | ICD-10-CM | POA: Diagnosis not present

## 2022-04-12 DIAGNOSIS — E441 Mild protein-calorie malnutrition: Secondary | ICD-10-CM | POA: Diagnosis not present

## 2022-04-12 DIAGNOSIS — F32A Depression, unspecified: Secondary | ICD-10-CM | POA: Diagnosis not present

## 2022-04-12 DIAGNOSIS — I10 Essential (primary) hypertension: Secondary | ICD-10-CM | POA: Diagnosis not present

## 2022-04-14 DIAGNOSIS — G3184 Mild cognitive impairment, so stated: Secondary | ICD-10-CM | POA: Diagnosis not present

## 2022-04-14 DIAGNOSIS — F331 Major depressive disorder, recurrent, moderate: Secondary | ICD-10-CM | POA: Diagnosis not present

## 2022-04-14 DIAGNOSIS — F419 Anxiety disorder, unspecified: Secondary | ICD-10-CM | POA: Diagnosis not present

## 2022-04-20 DIAGNOSIS — F331 Major depressive disorder, recurrent, moderate: Secondary | ICD-10-CM | POA: Diagnosis not present

## 2022-04-21 DIAGNOSIS — E441 Mild protein-calorie malnutrition: Secondary | ICD-10-CM | POA: Diagnosis not present

## 2022-04-21 DIAGNOSIS — F411 Generalized anxiety disorder: Secondary | ICD-10-CM | POA: Diagnosis not present

## 2022-04-21 DIAGNOSIS — K219 Gastro-esophageal reflux disease without esophagitis: Secondary | ICD-10-CM | POA: Diagnosis not present

## 2022-04-21 DIAGNOSIS — I10 Essential (primary) hypertension: Secondary | ICD-10-CM | POA: Diagnosis not present

## 2022-04-22 ENCOUNTER — Emergency Department (HOSPITAL_COMMUNITY): Payer: Medicare PPO

## 2022-04-22 ENCOUNTER — Emergency Department (HOSPITAL_COMMUNITY)
Admission: EM | Admit: 2022-04-22 | Discharge: 2022-04-23 | Disposition: A | Discharge status: Skilled Nursing Facility | Payer: Medicare PPO | Attending: Emergency Medicine | Admitting: Emergency Medicine

## 2022-04-22 DIAGNOSIS — I10 Essential (primary) hypertension: Secondary | ICD-10-CM | POA: Diagnosis not present

## 2022-04-22 DIAGNOSIS — S0003XA Contusion of scalp, initial encounter: Secondary | ICD-10-CM | POA: Diagnosis not present

## 2022-04-22 DIAGNOSIS — Z79899 Other long term (current) drug therapy: Secondary | ICD-10-CM | POA: Diagnosis not present

## 2022-04-22 DIAGNOSIS — F0390 Unspecified dementia without behavioral disturbance: Secondary | ICD-10-CM | POA: Insufficient documentation

## 2022-04-22 DIAGNOSIS — I959 Hypotension, unspecified: Secondary | ICD-10-CM | POA: Diagnosis not present

## 2022-04-22 DIAGNOSIS — T68XXXA Hypothermia, initial encounter: Secondary | ICD-10-CM | POA: Diagnosis not present

## 2022-04-22 DIAGNOSIS — W19XXXA Unspecified fall, initial encounter: Secondary | ICD-10-CM

## 2022-04-22 DIAGNOSIS — Y92129 Unspecified place in nursing home as the place of occurrence of the external cause: Secondary | ICD-10-CM | POA: Insufficient documentation

## 2022-04-22 DIAGNOSIS — W01198A Fall on same level from slipping, tripping and stumbling with subsequent striking against other object, initial encounter: Secondary | ICD-10-CM | POA: Insufficient documentation

## 2022-04-22 DIAGNOSIS — G319 Degenerative disease of nervous system, unspecified: Secondary | ICD-10-CM | POA: Diagnosis not present

## 2022-04-22 DIAGNOSIS — J449 Chronic obstructive pulmonary disease, unspecified: Secondary | ICD-10-CM | POA: Insufficient documentation

## 2022-04-22 DIAGNOSIS — R519 Headache, unspecified: Secondary | ICD-10-CM | POA: Diagnosis not present

## 2022-04-22 DIAGNOSIS — S0990XA Unspecified injury of head, initial encounter: Secondary | ICD-10-CM | POA: Diagnosis not present

## 2022-04-22 DIAGNOSIS — I6782 Cerebral ischemia: Secondary | ICD-10-CM | POA: Diagnosis not present

## 2022-04-22 DIAGNOSIS — R42 Dizziness and giddiness: Secondary | ICD-10-CM | POA: Diagnosis not present

## 2022-04-22 DIAGNOSIS — Y9301 Activity, walking, marching and hiking: Secondary | ICD-10-CM | POA: Diagnosis not present

## 2022-04-22 DIAGNOSIS — M4312 Spondylolisthesis, cervical region: Secondary | ICD-10-CM | POA: Diagnosis not present

## 2022-04-22 NOTE — ED Triage Notes (Signed)
BIB [facility] from [facility] after tripping at home several weeks ago. Was walking alongside bed, using at support, reports tripping over own feet and falling neck to bed on carpeted floor-hit back of head- hematoma to midline occipital area. No LOC,no thinners, no cp, no sob, no neck or back pain reported. Skin intact-no hemorrhage. Aox4 on arrival- dementia noted in facility HX.

## 2022-04-23 DIAGNOSIS — Z7401 Bed confinement status: Secondary | ICD-10-CM | POA: Diagnosis not present

## 2022-04-23 DIAGNOSIS — R531 Weakness: Secondary | ICD-10-CM | POA: Diagnosis not present

## 2022-04-23 MED ORDER — ACETAMINOPHEN 325 MG PO TABS
650.0000 mg | ORAL_TABLET | Freq: Once | ORAL | Status: AC
  Administered 2022-04-23: 650 mg via ORAL
  Filled 2022-04-23: qty 2

## 2022-04-23 NOTE — ED Notes (Signed)
Found pt in room trying to stand up and walk. Entered room and pt was advised that she needed to stay in bed. Pt started questioning why she was brought to this room. Pt was advised that she was waiting for the ambulance to come pick her up. Pt then asked how long it would be. Pt was advised that this nurse was unsure how long it could be before they got here. Pt then stated she needed to go to the restroom. Pt was ambulated with a walker to the restroom at this time. Pt was advised to use the red button to call when she needs to get up. Pt verbalized understanding. When pt was in the restroom educated pt on using the call bell in the restroom to wait for assistance prior to getting up and walking. Pt verbalized understanding. Once pt was finished in the restroom she used the call bell. This RN at side and assisted pt back to room with the walker. Once in the room pt was placed back in the bed and pt started talking about that she new a bunch of high powered attorneys and that this place was going to be sued. When tried to get pt to elaborate pt stated nevermind

## 2022-04-23 NOTE — ED Notes (Signed)
PTAR Called 

## 2022-04-23 NOTE — ED Provider Notes (Signed)
[facility] EMERGENCY DEPARTMENT Provider Note   CSN: [id number] Arrival date & time: 04/22/22  2125     History  Chief Complaint  Patient presents with   Holly Valenzuela is a 81 y.o. female.  Holly Valenzuela is a 81 y.o. female with history of HTN, HLD, COPD, dementia presenting from [facility] following a fall this evening. Patient was ambulating from the bathroom back to her bed holding onto the railing when she attempted to sit back in her bed but missed, landing on the carpeted floor; she struck the back of her head. Patient was down for less than 5 minutes.  Denies LOC, SOB, N/V, changes in vision, changes in bowel or bladder patterns. Pt endorses generalized headache. Pt not taking any blood thinners. She previously had a similar fall a couple weeks ago when she tripped over her shoes.   The history is provided by the patient. No language interpreter was used.  Fall       Home Medications Prior to Admission medications   Medication Sig Start Date End Date Taking? Authorizing Provider  ALPRAZolam Duanne Moron) 0.5 MG tablet Take 0.5-1 tablets (0.25-0.5 mg total) by mouth 3 (three) times daily as needed for anxiety. Patient taking differently: Take 0.5 mg by mouth at bedtime. 03/04/20   Geradine Girt, DO  amLODipine (NORVASC) 5 MG tablet Take 5 mg by mouth daily.    [provider]  ascorbic acid (VITAMIN C) 500 MG tablet Take 500-1,000 mg by mouth daily.    [provider]  atorvastatin (LIPITOR) 10 MG tablet Take 10 mg by mouth daily.    [provider]  butalbital-acetaminophen-caffeine (FIORICET) 50-325-40 MG tablet Take 0.5-1 tablets by mouth 4 (four) times daily as needed for migraine.  02/11/20   [provider]  cyanocobalamin (,VITAMIN B-12,) 1000 MCG/ML injection Inject 1,000 mcg into the muscle every 30 (thirty) days.     [provider]  famotidine (PEPCID) 20 MG tablet Take 1 tablet (20  mg total) by mouth daily as needed for heartburn or indigestion. 03/21/21   Elodia Florence., MD  fluticasone [employer]) 50 MCG/ACT nasal spray Place 2 sprays into both nostrils daily as needed for allergies or rhinitis.     [provider]  mirtazapine (REMERON) 30 MG tablet Take 30 mg by mouth at bedtime.    [provider]  pantoprazole (PROTONIX) 40 MG tablet Take 40 mg by mouth daily as needed (heartburn).    [provider]      Allergies    Adhesive [tape], Codeine, and Hydrocodone-acetaminophen    Review of Systems   Review of Systems Ten systems reviewed and are negative for acute change, except as noted in the HPI.    Physical Exam Updated Vital Signs BP (!) 155/97   Pulse 81   Temp 97.7 F (36.5 C)   Resp 17   Ht '5\' 5"'$  (1.651 m)   Wt 40 kg   SpO2 99%   BMI 14.67 kg/m   Physical Exam Vitals and nursing note reviewed.  Constitutional:      General: She is not in acute distress.    Appearance: She is well-developed. She is not diaphoretic.     Comments: Frail appearing, nontoxic.  HENT:     Head: Normocephalic.     Comments: Hematoma to posterior parietal scalp without skull instability.  No Battle sign or raccoon's eyes. Eyes:     General: No scleral  icterus.    Conjunctiva/sclera: Conjunctivae normal.  Pulmonary:     Effort: Pulmonary effort is normal. No respiratory distress.     Comments: Respirations even and unlabored Musculoskeletal:        General: Normal range of motion.     Cervical back: Normal range of motion.  Skin:    General: Skin is warm and dry.     Coloration: Skin is not pale.     Findings: No erythema or rash.     Comments: No bruising, abrasions, or erythema to trunk or extremities.  Neurological:     Mental Status: She is alert.     Coordination: Coordination normal.     Comments: GCS 15. Speech is goal oriented. No deficits appreciated to CN III-XII; symmetric eyebrow raise, no facial drooping, tongue  midline. Patient has equal grip strength bilaterally with 5/5 strength against resistance in all major muscle groups bilaterally. Sensation to light touch intact. Ambulatory to the bathroom with assistance; uses a walker at baseline.  Psychiatric:        Behavior: Behavior normal.     ED Results / Procedures / Treatments   Labs (all labs ordered are listed, but only abnormal results are displayed) Labs Reviewed - No data to display  EKG None  Radiology CT Cervical Spine Wo Contrast  Result Date: 04/22/2022 CLINICAL DATA:  Neck trauma (Age >= 65y) EXAM: CT CERVICAL SPINE WITHOUT CONTRAST TECHNIQUE: Multidetector CT imaging of the cervical spine was performed without intravenous contrast. Multiplanar CT image reconstructions were also generated. RADIATION DOSE REDUCTION: This exam was performed according to the departmental dose-optimization program which includes automated exposure control, adjustment of the mA and/or kV according to patient size and/or use of iterative reconstruction technique. COMPARISON:  03/27/2021 FINDINGS: Alignment: Progressive anterolisthesis of C4 on C5, currently 4 mm, previously 2 mm. Increasing focal kyphosis at this level. This appears progressive but nonacute. No jumped or perched facets. Skull base and vertebrae: No acute fracture. The dens and skull base are intact. Endplate sclerosis with erosions centered at C4-C5 disc space. Soft tissues and spinal canal: No prevertebral fluid or swelling. No visible canal hematoma. Disc levels: Progressive disc space loss at C4-C5 with endplate sclerosis and possible erosions. Similar disc space narrowing and spurring at C5-C6. Upper chest: Mild apical scarring.  No acute findings. Other: None. IMPRESSION: 1. No acute fracture or traumatic subluxation of the cervical spine. 2. Progressive disc space loss at C4-C5 with endplate sclerosis and possible erosions. Progressive anterolisthesis of C4 on C5. This appears progressive since  October of 2022 but nonacute. Recommend further assessment with MRI. Electronically Signed   By: Keith Rake M.D.   On: 04/22/2022 23:27   CT HEAD WO CONTRAST (5MM)  Result Date: 04/22/2022 CLINICAL DATA:  Head trauma, minor (Age >= 65y) Fall backwards hitting head. EXAM: CT HEAD WITHOUT CONTRAST TECHNIQUE: Contiguous axial images were obtained from the base of the skull through the vertex without intravenous contrast. RADIATION DOSE REDUCTION: This exam was performed according to the departmental dose-optimization program which includes automated exposure control, adjustment of the mA and/or kV according to patient size and/or use of iterative reconstruction technique. COMPARISON:  Head CT 03/27/2021 FINDINGS: Brain: No evidence of acute infarction, hemorrhage, hydrocephalus, extra-axial collection or mass lesion/mass effect. Stable degree of advanced atrophy and chronic small vessel ischemia. Vascular: No hyperdense vessel or unexpected calcification. Skull: Normal. Negative for fracture or focal lesion. Sinuses/Orbits: Paranasal sinuses and mastoid air cells are clear. The visualized orbits are  unremarkable. Other: None. IMPRESSION: 1. No acute intracranial abnormality. 2. Stable degree of advanced atrophy and chronic small vessel ischemia. Electronically Signed   By: Keith Rake M.D.   On: 04/22/2022 23:22    Procedures Procedures    Medications Ordered in ED Medications  acetaminophen (TYLENOL) tablet 650 mg (has no administration in time range)    ED Course/ Medical Decision Making/ A&P                           Medical Decision Making Risk OTC drugs.   This patient presents to the ED for concern of head injury s/p mechanical fall, this involves an extensive number of treatment options, and is a complaint that carries with it a high risk of complications and morbidity.  The differential diagnosis includes hematoma vs scalp laceration vs skull fx vs ICH vs concussion   Co  morbidities that complicate the patient evaluation  Dementia  HTN    Additional history obtained:  Additional history obtained from EMS External records from outside source obtained and reviewed including prior left shoulder Xray from 1 year ago.   Imaging Studies ordered:  I ordered imaging studies including CT head and C-spine  I independently visualized and interpreted imaging which showed no acute intracranial abnormality. No acute or traumatic changes to the C-spine. I agree with the radiologist interpretation   Cardiac Monitoring:  The patient was maintained on a cardiac monitor.  I personally viewed and interpreted the cardiac monitored which showed an underlying rhythm of: NSR   Medicines ordered and prescription drug management:  I ordered medication including Tylenol for headache  Reevaluation of the patient after these medicines showed that the patient improved I have reviewed the patients home medicines and have made adjustments as needed   Test Considered:  UA   Reevaluation:  After the interventions noted above, I reevaluated the patient and found that they have : remained stable   Social Determinants of Health:  Ambulates with assist device   Dispostion:  After consideration of the diagnostic results and the patients response to treatment, I feel that the patent would benefit from outpatient PCP follow up for recheck. Patient can continue Tylenol PRN for pain. Return precautions discussed and provided. Patient discharged in stable condition with no unaddressed concerns.          Final Clinical Impression(s) / ED Diagnoses Final diagnoses:  Scalp hematoma, initial encounter  Fall, initial encounter    Rx / DC Orders ED Discharge Orders     None         Antonietta Breach, PA-C 04/23/22 0054    Merrily Pew, MD 04/23/22 [phone]

## 2022-04-23 NOTE — ED Notes (Signed)
Ambulates in hall using walker with minimal assistance to restroom. Report called to [facility].

## 2022-04-23 NOTE — Discharge Instructions (Addendum)
Your imaging in the ED today was reassuring. We recommend Tylenol for headaches, as needed. Return for new or concerning symptoms.

## 2022-04-27 IMAGING — MR MR HEAD W/O CM
9 of 10 series · 38 of 48 positions shown · non-contrast
Comparison: Head CT 02/28/2020.

CLINICAL DATA: 79-year-old female with dizziness. Episode of loss
of consciousness.

EXAM:
MRI HEAD WITHOUT CONTRAST
TECHNIQUE: Multiplanar, multiecho pulse sequences of the brain and surrounding
structures were obtained without intravenous contrast.

[Series 3: DWI · axial · 3.0mm · 1.09mm/px · z∈[-79,+75]mm · 11 of 106 slices shown (1 of 4)]
[im 1/106]
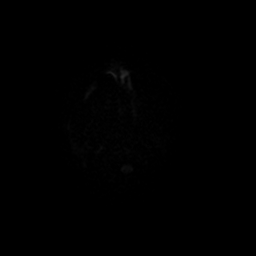
[im 11/106]
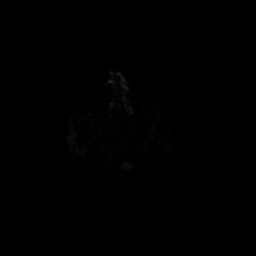
[im 22/106]
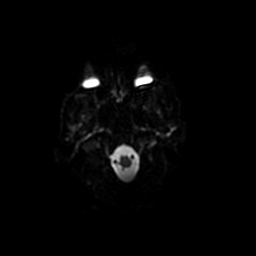
[im 32/106]
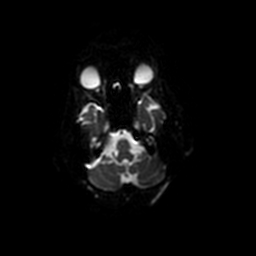
[im 43/106]
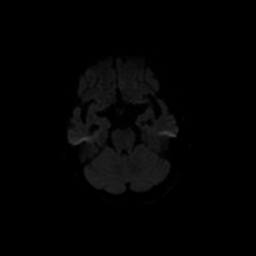
[im 53/106]
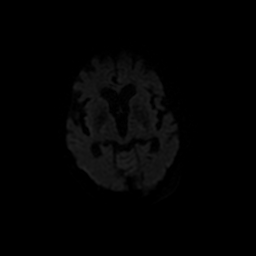
[im 64/106]
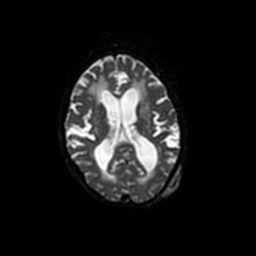
[im 74/106]
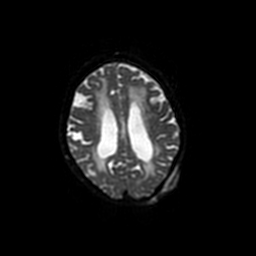
[im 85/106]
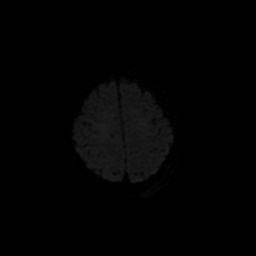
[im 95/106]
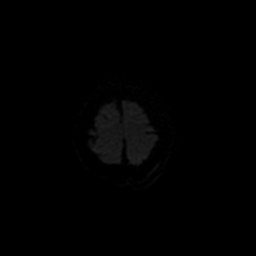
[im 106/106]
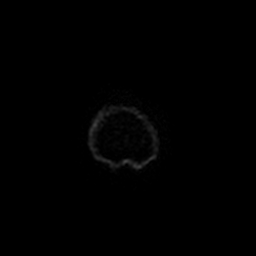

[Series 4: DWI · coronal · 5.0mm · 1.09mm/px · 8 of 74 slices shown (2 of 4)]
[im 1/74]
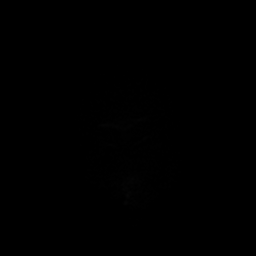
[im 11/74]
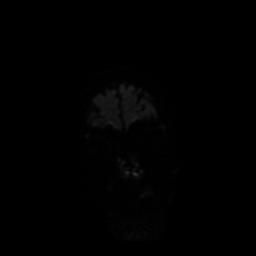
[im 21/74]
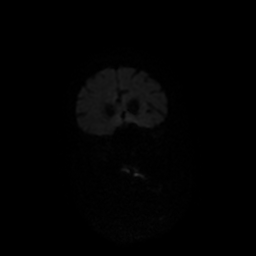
[im 32/74]
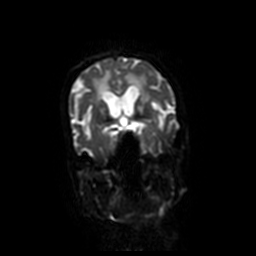
[im 42/74]
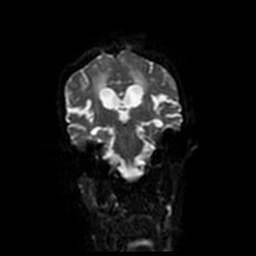
[im 53/74]
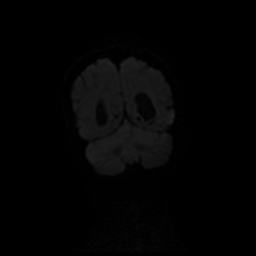
[im 63/74]
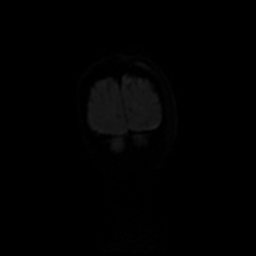
[im 74/74]
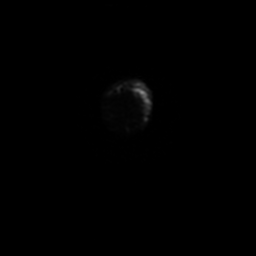

[Series 5: T1 · sagittal · 5.0mm · 0.47mm/px · 2 of 22 slices shown (1 of 2)]
[im 1/22]
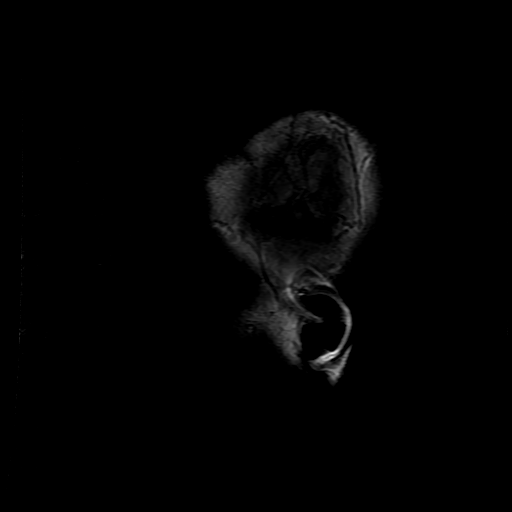
[im 22/22]
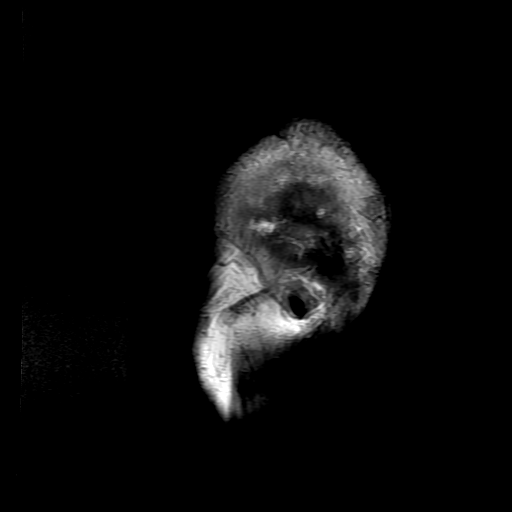

[Series 6: T2 · axial · 5.0mm · 0.43mm/px · z∈[-72,+64]mm · 2 of 24 slices shown (1 of 2)]
[im 1/24]
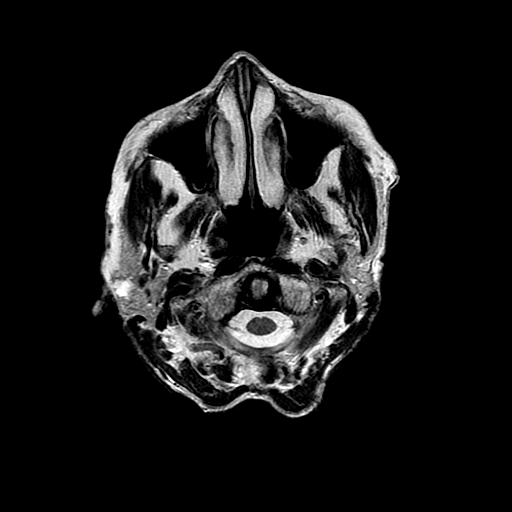
[im 24/24]
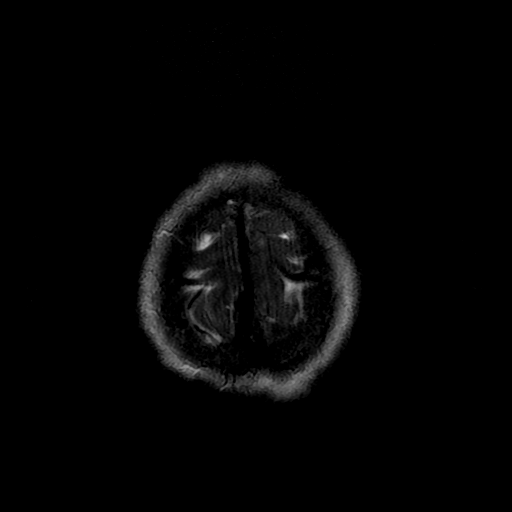

[Series 7: FLAIR · axial · 3.0mm · 0.43mm/px · z∈[-72,+64]mm · 2 of 24 slices shown]
[im 1/24]
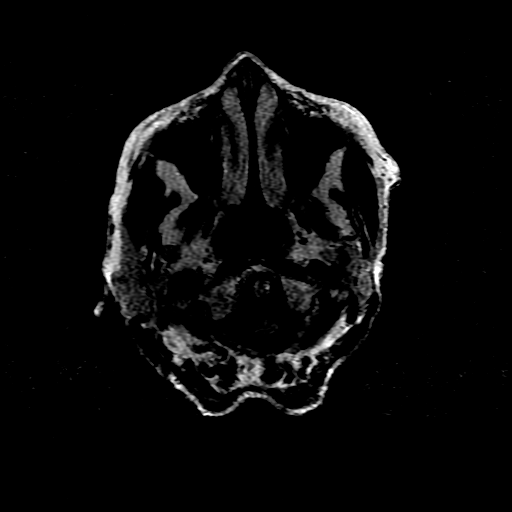
[im 24/24]
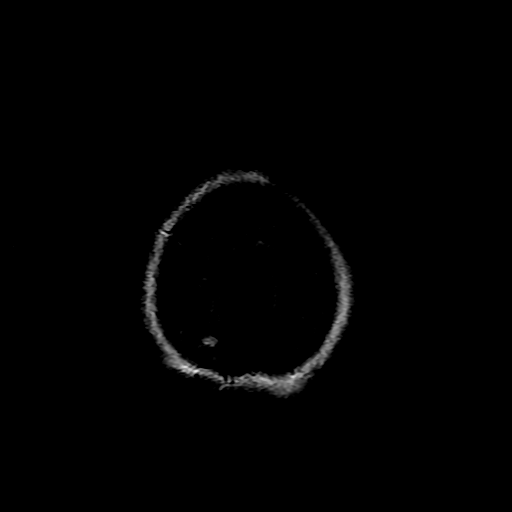

[Series 9: T1 · axial · 3.0mm · 0.43mm/px · z∈[-79,-62]mm · 2 of 100 slices shown (2 of 2)]
[im 1/100]
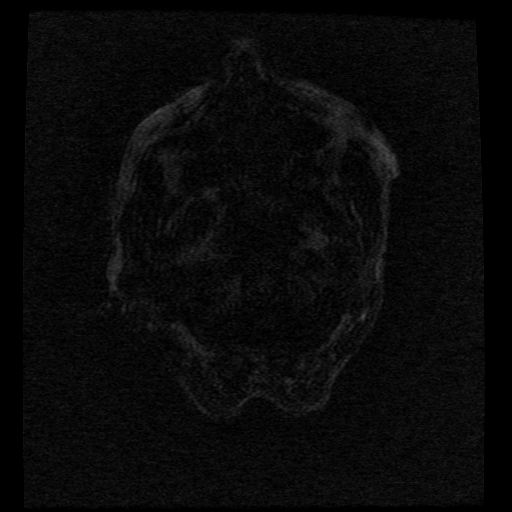
[im 12/100]
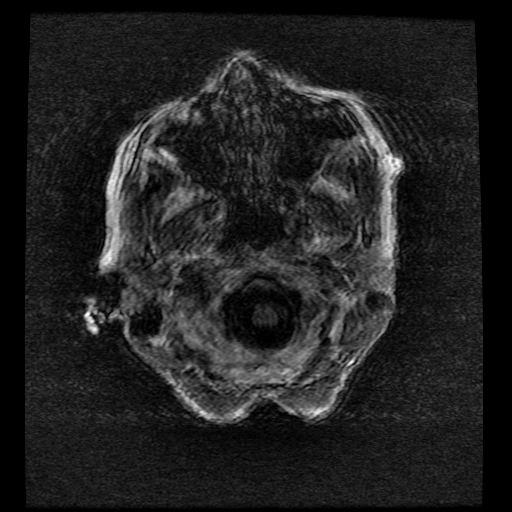

[Series 10: T2 · coronal · 5.0mm · 0.39mm/px · 2 of 24 slices shown (2 of 2)]
[im 1/24]
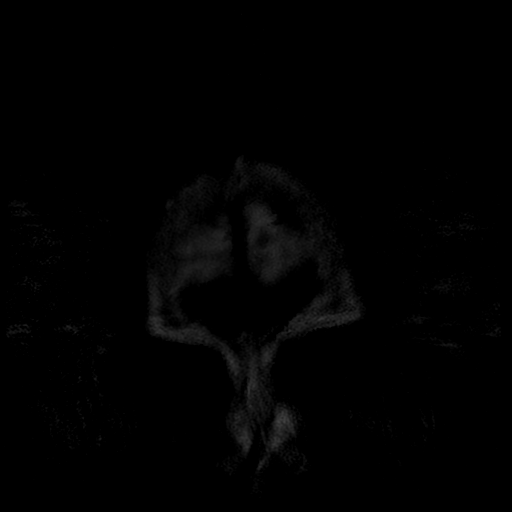
[im 24/24]
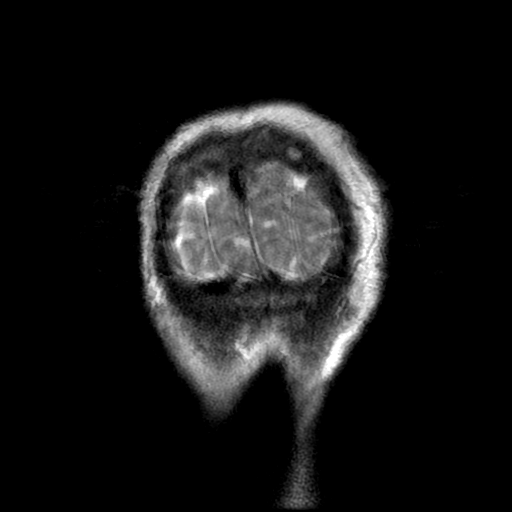

[Series 300: DWI · axial · 3.0mm · 1.09mm/px · z∈[-79,+75]mm · 5 of 53 slices shown (3 of 4)]
[im 1/53]
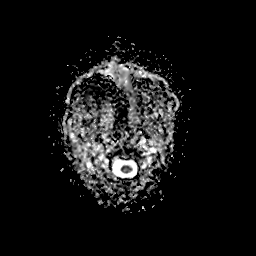
[im 14/53]
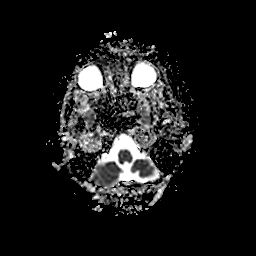
[im 27/53]
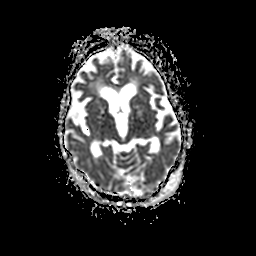
[im 40/53]
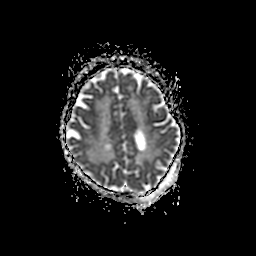
[im 53/53]
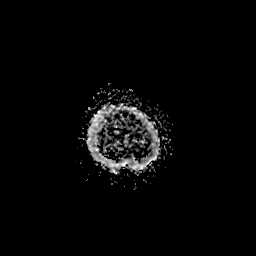

[Series 400: DWI · coronal · 5.0mm · 1.09mm/px · 4 of 37 slices shown (4 of 4)]
[im 1/37]
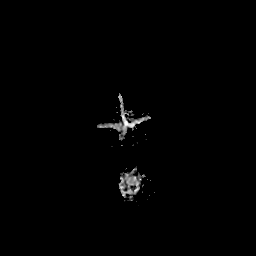
[im 13/37]
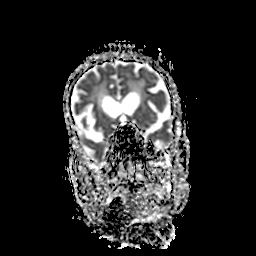
[im 25/37]
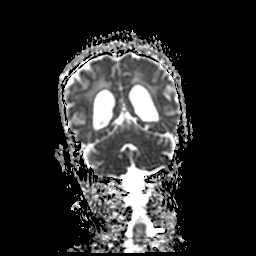
[im 37/37]
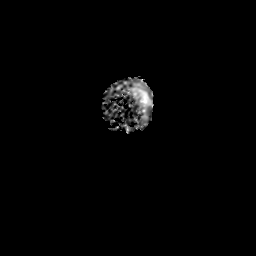

[38 of 48 positions shown; findings below may reference images not displayed]

FINDINGS: Brain: Stable cerebral volume. No restricted diffusion to suggest
acute infarction. No midline shift, mass effect, evidence of mass
lesion, ventriculomegaly, extra-axial collection or acute
intracranial hemorrhage. Cervicomedullary junction and pituitary are
within normal limits.

Widespread, confluent bilateral cerebral white matter T2 and FLAIR
hyperintensity, with similar signal changes in the pons.
Comparatively mild T2 heterogeneity in the bilateral deep gray
nuclei, relatively sparing the right thalamus. No cortical
encephalomalacia identified. There is a solitary chronic
microhemorrhage in the left parietal lobe (series 8, image 18).
Negative cerebellum.

Vascular: Major intracranial vascular flow voids are preserved.
Fetal type right PCA origin suspected (normal variant).

Skull and upper cervical spine: Mild for age visible cervical spine
degeneration, capacious spinal canal. Visualized bone marrow signal
is within normal limits.

Sinuses/Orbits: Negative.

Other: Mastoids are clear. Visible internal auditory structures
appear normal. Scalp and face appear negative.
IMPRESSION: 1. No acute intracranial abnormality.
2. Advanced signal changes in the brain most likely due to chronic
small vessel disease.

## 2022-04-28 DIAGNOSIS — F411 Generalized anxiety disorder: Secondary | ICD-10-CM | POA: Diagnosis not present

## 2022-04-28 DIAGNOSIS — K219 Gastro-esophageal reflux disease without esophagitis: Secondary | ICD-10-CM | POA: Diagnosis not present

## 2022-04-28 DIAGNOSIS — E441 Mild protein-calorie malnutrition: Secondary | ICD-10-CM | POA: Diagnosis not present

## 2022-04-28 DIAGNOSIS — I10 Essential (primary) hypertension: Secondary | ICD-10-CM | POA: Diagnosis not present

## 2022-05-05 DIAGNOSIS — F331 Major depressive disorder, recurrent, moderate: Secondary | ICD-10-CM | POA: Diagnosis not present

## 2022-05-05 DIAGNOSIS — G3184 Mild cognitive impairment, so stated: Secondary | ICD-10-CM | POA: Diagnosis not present

## 2022-05-05 DIAGNOSIS — F419 Anxiety disorder, unspecified: Secondary | ICD-10-CM | POA: Diagnosis not present

## 2022-05-06 DIAGNOSIS — F331 Major depressive disorder, recurrent, moderate: Secondary | ICD-10-CM | POA: Diagnosis not present

## 2022-05-12 DIAGNOSIS — E441 Mild protein-calorie malnutrition: Secondary | ICD-10-CM | POA: Diagnosis not present

## 2022-05-12 DIAGNOSIS — K219 Gastro-esophageal reflux disease without esophagitis: Secondary | ICD-10-CM | POA: Diagnosis not present

## 2022-05-12 DIAGNOSIS — F411 Generalized anxiety disorder: Secondary | ICD-10-CM | POA: Diagnosis not present

## 2022-05-12 DIAGNOSIS — I10 Essential (primary) hypertension: Secondary | ICD-10-CM | POA: Diagnosis not present

## 2022-05-15 DIAGNOSIS — E43 Unspecified severe protein-calorie malnutrition: Secondary | ICD-10-CM | POA: Diagnosis not present

## 2022-05-15 DIAGNOSIS — R531 Weakness: Secondary | ICD-10-CM | POA: Diagnosis not present

## 2022-05-15 DIAGNOSIS — J449 Chronic obstructive pulmonary disease, unspecified: Secondary | ICD-10-CM | POA: Diagnosis not present

## 2022-05-15 DIAGNOSIS — I10 Essential (primary) hypertension: Secondary | ICD-10-CM | POA: Diagnosis not present

## 2022-05-20 DIAGNOSIS — K219 Gastro-esophageal reflux disease without esophagitis: Secondary | ICD-10-CM | POA: Diagnosis not present

## 2022-05-20 DIAGNOSIS — F411 Generalized anxiety disorder: Secondary | ICD-10-CM | POA: Diagnosis not present

## 2022-05-20 DIAGNOSIS — I10 Essential (primary) hypertension: Secondary | ICD-10-CM | POA: Diagnosis not present

## 2022-05-20 DIAGNOSIS — E441 Mild protein-calorie malnutrition: Secondary | ICD-10-CM | POA: Diagnosis not present

## 2022-05-28 DIAGNOSIS — I10 Essential (primary) hypertension: Secondary | ICD-10-CM | POA: Diagnosis not present

## 2022-05-28 DIAGNOSIS — R42 Dizziness and giddiness: Secondary | ICD-10-CM | POA: Diagnosis not present

## 2022-05-28 DIAGNOSIS — F411 Generalized anxiety disorder: Secondary | ICD-10-CM | POA: Diagnosis not present

## 2022-05-28 DIAGNOSIS — E441 Mild protein-calorie malnutrition: Secondary | ICD-10-CM | POA: Diagnosis not present

## 2022-06-03 DIAGNOSIS — F331 Major depressive disorder, recurrent, moderate: Secondary | ICD-10-CM | POA: Diagnosis not present

## 2022-06-07 DIAGNOSIS — F32A Depression, unspecified: Secondary | ICD-10-CM | POA: Diagnosis not present

## 2022-06-07 DIAGNOSIS — R519 Headache, unspecified: Secondary | ICD-10-CM | POA: Diagnosis not present

## 2022-06-07 DIAGNOSIS — F411 Generalized anxiety disorder: Secondary | ICD-10-CM | POA: Diagnosis not present

## 2022-06-07 DIAGNOSIS — K5900 Constipation, unspecified: Secondary | ICD-10-CM | POA: Diagnosis not present

## 2022-06-07 DIAGNOSIS — E441 Mild protein-calorie malnutrition: Secondary | ICD-10-CM | POA: Diagnosis not present

## 2022-06-07 DIAGNOSIS — K219 Gastro-esophageal reflux disease without esophagitis: Secondary | ICD-10-CM | POA: Diagnosis not present

## 2022-06-07 DIAGNOSIS — I10 Essential (primary) hypertension: Secondary | ICD-10-CM | POA: Diagnosis not present

## 2022-06-09 DIAGNOSIS — F331 Major depressive disorder, recurrent, moderate: Secondary | ICD-10-CM | POA: Diagnosis not present

## 2022-06-09 DIAGNOSIS — F419 Anxiety disorder, unspecified: Secondary | ICD-10-CM | POA: Diagnosis not present

## 2022-06-09 DIAGNOSIS — G3184 Mild cognitive impairment, so stated: Secondary | ICD-10-CM | POA: Diagnosis not present

## 2022-06-11 DIAGNOSIS — I739 Peripheral vascular disease, unspecified: Secondary | ICD-10-CM | POA: Diagnosis not present

## 2022-06-11 DIAGNOSIS — B351 Tinea unguium: Secondary | ICD-10-CM | POA: Diagnosis not present

## 2022-06-11 DIAGNOSIS — M2041 Other hammer toe(s) (acquired), right foot: Secondary | ICD-10-CM | POA: Diagnosis not present

## 2022-06-11 DIAGNOSIS — L603 Nail dystrophy: Secondary | ICD-10-CM | POA: Diagnosis not present

## 2022-06-11 DIAGNOSIS — M2042 Other hammer toe(s) (acquired), left foot: Secondary | ICD-10-CM | POA: Diagnosis not present

## 2022-06-12 DIAGNOSIS — I10 Essential (primary) hypertension: Secondary | ICD-10-CM | POA: Diagnosis not present

## 2022-06-12 DIAGNOSIS — E43 Unspecified severe protein-calorie malnutrition: Secondary | ICD-10-CM | POA: Diagnosis not present

## 2022-06-12 DIAGNOSIS — J449 Chronic obstructive pulmonary disease, unspecified: Secondary | ICD-10-CM | POA: Diagnosis not present

## 2022-06-12 DIAGNOSIS — R531 Weakness: Secondary | ICD-10-CM | POA: Diagnosis not present

## 2022-06-15 DIAGNOSIS — R42 Dizziness and giddiness: Secondary | ICD-10-CM | POA: Diagnosis not present

## 2022-06-15 DIAGNOSIS — E441 Mild protein-calorie malnutrition: Secondary | ICD-10-CM | POA: Diagnosis not present

## 2022-06-15 DIAGNOSIS — I10 Essential (primary) hypertension: Secondary | ICD-10-CM | POA: Diagnosis not present

## 2022-06-15 DIAGNOSIS — F411 Generalized anxiety disorder: Secondary | ICD-10-CM | POA: Diagnosis not present

## 2022-06-16 DIAGNOSIS — Z79899 Other long term (current) drug therapy: Secondary | ICD-10-CM | POA: Diagnosis not present

## 2022-06-22 DIAGNOSIS — F331 Major depressive disorder, recurrent, moderate: Secondary | ICD-10-CM | POA: Diagnosis not present

## 2022-06-22 DIAGNOSIS — F419 Anxiety disorder, unspecified: Secondary | ICD-10-CM | POA: Diagnosis not present

## 2022-06-23 DIAGNOSIS — E871 Hypo-osmolality and hyponatremia: Secondary | ICD-10-CM | POA: Diagnosis not present

## 2022-06-23 DIAGNOSIS — K219 Gastro-esophageal reflux disease without esophagitis: Secondary | ICD-10-CM | POA: Diagnosis not present

## 2022-06-23 DIAGNOSIS — R42 Dizziness and giddiness: Secondary | ICD-10-CM | POA: Diagnosis not present

## 2022-06-23 DIAGNOSIS — F411 Generalized anxiety disorder: Secondary | ICD-10-CM | POA: Diagnosis not present

## 2022-06-23 DIAGNOSIS — E441 Mild protein-calorie malnutrition: Secondary | ICD-10-CM | POA: Diagnosis not present

## 2022-06-23 DIAGNOSIS — I10 Essential (primary) hypertension: Secondary | ICD-10-CM | POA: Diagnosis not present

## 2022-07-06 DIAGNOSIS — F331 Major depressive disorder, recurrent, moderate: Secondary | ICD-10-CM | POA: Diagnosis not present

## 2022-07-07 DIAGNOSIS — K219 Gastro-esophageal reflux disease without esophagitis: Secondary | ICD-10-CM | POA: Diagnosis not present

## 2022-07-07 DIAGNOSIS — I10 Essential (primary) hypertension: Secondary | ICD-10-CM | POA: Diagnosis not present

## 2022-07-07 DIAGNOSIS — R42 Dizziness and giddiness: Secondary | ICD-10-CM | POA: Diagnosis not present

## 2022-07-07 DIAGNOSIS — F411 Generalized anxiety disorder: Secondary | ICD-10-CM | POA: Diagnosis not present

## 2022-07-07 DIAGNOSIS — F32A Depression, unspecified: Secondary | ICD-10-CM | POA: Diagnosis not present

## 2022-07-09 DIAGNOSIS — F0390 Unspecified dementia without behavioral disturbance: Secondary | ICD-10-CM | POA: Diagnosis not present

## 2022-07-09 DIAGNOSIS — F419 Anxiety disorder, unspecified: Secondary | ICD-10-CM | POA: Diagnosis not present

## 2022-07-09 DIAGNOSIS — F331 Major depressive disorder, recurrent, moderate: Secondary | ICD-10-CM | POA: Diagnosis not present

## 2022-07-15 DIAGNOSIS — R42 Dizziness and giddiness: Secondary | ICD-10-CM | POA: Diagnosis not present

## 2022-07-15 DIAGNOSIS — K219 Gastro-esophageal reflux disease without esophagitis: Secondary | ICD-10-CM | POA: Diagnosis not present

## 2022-07-15 DIAGNOSIS — F419 Anxiety disorder, unspecified: Secondary | ICD-10-CM | POA: Diagnosis not present

## 2022-07-15 DIAGNOSIS — I10 Essential (primary) hypertension: Secondary | ICD-10-CM | POA: Diagnosis not present

## 2022-07-16 DIAGNOSIS — E559 Vitamin D deficiency, unspecified: Secondary | ICD-10-CM | POA: Diagnosis not present

## 2022-07-16 DIAGNOSIS — E119 Type 2 diabetes mellitus without complications: Secondary | ICD-10-CM | POA: Diagnosis not present

## 2022-07-16 DIAGNOSIS — E785 Hyperlipidemia, unspecified: Secondary | ICD-10-CM | POA: Diagnosis not present

## 2022-07-16 DIAGNOSIS — Z79899 Other long term (current) drug therapy: Secondary | ICD-10-CM | POA: Diagnosis not present

## 2022-07-16 DIAGNOSIS — E039 Hypothyroidism, unspecified: Secondary | ICD-10-CM | POA: Diagnosis not present

## 2022-07-16 DIAGNOSIS — I10 Essential (primary) hypertension: Secondary | ICD-10-CM | POA: Diagnosis not present

## 2022-07-24 DIAGNOSIS — J449 Chronic obstructive pulmonary disease, unspecified: Secondary | ICD-10-CM | POA: Diagnosis not present

## 2022-07-24 DIAGNOSIS — E43 Unspecified severe protein-calorie malnutrition: Secondary | ICD-10-CM | POA: Diagnosis not present

## 2022-07-24 DIAGNOSIS — I10 Essential (primary) hypertension: Secondary | ICD-10-CM | POA: Diagnosis not present

## 2022-07-24 DIAGNOSIS — R531 Weakness: Secondary | ICD-10-CM | POA: Diagnosis not present

## 2022-07-26 DIAGNOSIS — K219 Gastro-esophageal reflux disease without esophagitis: Secondary | ICD-10-CM | POA: Diagnosis not present

## 2022-07-26 DIAGNOSIS — I10 Essential (primary) hypertension: Secondary | ICD-10-CM | POA: Diagnosis not present

## 2022-07-26 DIAGNOSIS — F32A Depression, unspecified: Secondary | ICD-10-CM | POA: Diagnosis not present

## 2022-07-26 DIAGNOSIS — F411 Generalized anxiety disorder: Secondary | ICD-10-CM | POA: Diagnosis not present

## 2022-07-26 DIAGNOSIS — R531 Weakness: Secondary | ICD-10-CM | POA: Diagnosis not present

## 2022-07-26 DIAGNOSIS — K5900 Constipation, unspecified: Secondary | ICD-10-CM | POA: Diagnosis not present

## 2022-07-27 DIAGNOSIS — F419 Anxiety disorder, unspecified: Secondary | ICD-10-CM | POA: Diagnosis not present

## 2022-07-27 DIAGNOSIS — F331 Major depressive disorder, recurrent, moderate: Secondary | ICD-10-CM | POA: Diagnosis not present

## 2022-07-29 DIAGNOSIS — R42 Dizziness and giddiness: Secondary | ICD-10-CM | POA: Diagnosis not present

## 2022-07-29 DIAGNOSIS — E441 Mild protein-calorie malnutrition: Secondary | ICD-10-CM | POA: Diagnosis not present

## 2022-07-29 DIAGNOSIS — K219 Gastro-esophageal reflux disease without esophagitis: Secondary | ICD-10-CM | POA: Diagnosis not present

## 2022-07-29 DIAGNOSIS — R531 Weakness: Secondary | ICD-10-CM | POA: Diagnosis not present

## 2022-07-29 DIAGNOSIS — I10 Essential (primary) hypertension: Secondary | ICD-10-CM | POA: Diagnosis not present

## 2022-07-29 DIAGNOSIS — F411 Generalized anxiety disorder: Secondary | ICD-10-CM | POA: Diagnosis not present

## 2022-08-02 DIAGNOSIS — F17290 Nicotine dependence, other tobacco product, uncomplicated: Secondary | ICD-10-CM | POA: Diagnosis not present

## 2022-08-02 DIAGNOSIS — F32A Depression, unspecified: Secondary | ICD-10-CM | POA: Diagnosis not present

## 2022-08-02 DIAGNOSIS — K219 Gastro-esophageal reflux disease without esophagitis: Secondary | ICD-10-CM | POA: Diagnosis not present

## 2022-08-02 DIAGNOSIS — K5900 Constipation, unspecified: Secondary | ICD-10-CM | POA: Diagnosis not present

## 2022-08-02 DIAGNOSIS — E441 Mild protein-calorie malnutrition: Secondary | ICD-10-CM | POA: Diagnosis not present

## 2022-08-02 DIAGNOSIS — F411 Generalized anxiety disorder: Secondary | ICD-10-CM | POA: Diagnosis not present

## 2022-08-04 DIAGNOSIS — F331 Major depressive disorder, recurrent, moderate: Secondary | ICD-10-CM | POA: Diagnosis not present

## 2022-08-04 DIAGNOSIS — F419 Anxiety disorder, unspecified: Secondary | ICD-10-CM | POA: Diagnosis not present

## 2022-08-04 DIAGNOSIS — F0390 Unspecified dementia without behavioral disturbance: Secondary | ICD-10-CM | POA: Diagnosis not present

## 2022-08-06 DIAGNOSIS — R42 Dizziness and giddiness: Secondary | ICD-10-CM | POA: Diagnosis not present

## 2022-08-06 DIAGNOSIS — K219 Gastro-esophageal reflux disease without esophagitis: Secondary | ICD-10-CM | POA: Diagnosis not present

## 2022-08-06 DIAGNOSIS — I10 Essential (primary) hypertension: Secondary | ICD-10-CM | POA: Diagnosis not present

## 2022-08-06 DIAGNOSIS — E46 Unspecified protein-calorie malnutrition: Secondary | ICD-10-CM | POA: Diagnosis not present

## 2022-08-10 DIAGNOSIS — I10 Essential (primary) hypertension: Secondary | ICD-10-CM | POA: Diagnosis not present

## 2022-08-15 DIAGNOSIS — K5900 Constipation, unspecified: Secondary | ICD-10-CM | POA: Diagnosis not present

## 2022-08-15 DIAGNOSIS — F411 Generalized anxiety disorder: Secondary | ICD-10-CM | POA: Diagnosis not present

## 2022-08-15 DIAGNOSIS — E441 Mild protein-calorie malnutrition: Secondary | ICD-10-CM | POA: Diagnosis not present

## 2022-08-15 DIAGNOSIS — F32A Depression, unspecified: Secondary | ICD-10-CM | POA: Diagnosis not present

## 2022-08-15 DIAGNOSIS — K219 Gastro-esophageal reflux disease without esophagitis: Secondary | ICD-10-CM | POA: Diagnosis not present

## 2022-08-15 DIAGNOSIS — I10 Essential (primary) hypertension: Secondary | ICD-10-CM | POA: Diagnosis not present

## 2022-08-17 DIAGNOSIS — F330 Major depressive disorder, recurrent, mild: Secondary | ICD-10-CM | POA: Diagnosis not present

## 2022-08-20 DIAGNOSIS — K219 Gastro-esophageal reflux disease without esophagitis: Secondary | ICD-10-CM | POA: Diagnosis not present

## 2022-08-20 DIAGNOSIS — I10 Essential (primary) hypertension: Secondary | ICD-10-CM | POA: Diagnosis not present

## 2022-08-20 DIAGNOSIS — R42 Dizziness and giddiness: Secondary | ICD-10-CM | POA: Diagnosis not present

## 2022-08-20 DIAGNOSIS — E441 Mild protein-calorie malnutrition: Secondary | ICD-10-CM | POA: Diagnosis not present

## 2022-08-20 DIAGNOSIS — F411 Generalized anxiety disorder: Secondary | ICD-10-CM | POA: Diagnosis not present

## 2022-08-21 DIAGNOSIS — R531 Weakness: Secondary | ICD-10-CM | POA: Diagnosis not present

## 2022-08-21 DIAGNOSIS — J449 Chronic obstructive pulmonary disease, unspecified: Secondary | ICD-10-CM | POA: Diagnosis not present

## 2022-08-21 DIAGNOSIS — I10 Essential (primary) hypertension: Secondary | ICD-10-CM | POA: Diagnosis not present

## 2022-08-21 DIAGNOSIS — E43 Unspecified severe protein-calorie malnutrition: Secondary | ICD-10-CM | POA: Diagnosis not present

## 2022-08-25 DIAGNOSIS — F331 Major depressive disorder, recurrent, moderate: Secondary | ICD-10-CM | POA: Diagnosis not present

## 2022-08-25 DIAGNOSIS — F419 Anxiety disorder, unspecified: Secondary | ICD-10-CM | POA: Diagnosis not present

## 2022-08-25 DIAGNOSIS — F0390 Unspecified dementia without behavioral disturbance: Secondary | ICD-10-CM | POA: Diagnosis not present

## 2022-08-31 DIAGNOSIS — F419 Anxiety disorder, unspecified: Secondary | ICD-10-CM | POA: Diagnosis not present

## 2022-08-31 DIAGNOSIS — F331 Major depressive disorder, recurrent, moderate: Secondary | ICD-10-CM | POA: Diagnosis not present

## 2022-09-01 DIAGNOSIS — E441 Mild protein-calorie malnutrition: Secondary | ICD-10-CM | POA: Diagnosis not present

## 2022-09-01 DIAGNOSIS — I10 Essential (primary) hypertension: Secondary | ICD-10-CM | POA: Diagnosis not present

## 2022-09-01 DIAGNOSIS — R42 Dizziness and giddiness: Secondary | ICD-10-CM | POA: Diagnosis not present

## 2022-09-01 DIAGNOSIS — K219 Gastro-esophageal reflux disease without esophagitis: Secondary | ICD-10-CM | POA: Diagnosis not present

## 2022-09-01 DIAGNOSIS — F411 Generalized anxiety disorder: Secondary | ICD-10-CM | POA: Diagnosis not present

## 2022-09-10 DIAGNOSIS — E119 Type 2 diabetes mellitus without complications: Secondary | ICD-10-CM | POA: Diagnosis not present

## 2022-09-12 DIAGNOSIS — F17290 Nicotine dependence, other tobacco product, uncomplicated: Secondary | ICD-10-CM | POA: Diagnosis not present

## 2022-09-12 DIAGNOSIS — E860 Dehydration: Secondary | ICD-10-CM | POA: Diagnosis not present

## 2022-09-12 DIAGNOSIS — K219 Gastro-esophageal reflux disease without esophagitis: Secondary | ICD-10-CM | POA: Diagnosis not present

## 2022-09-12 DIAGNOSIS — F32A Depression, unspecified: Secondary | ICD-10-CM | POA: Diagnosis not present

## 2022-09-12 DIAGNOSIS — K5900 Constipation, unspecified: Secondary | ICD-10-CM | POA: Diagnosis not present

## 2022-09-12 DIAGNOSIS — E441 Mild protein-calorie malnutrition: Secondary | ICD-10-CM | POA: Diagnosis not present

## 2022-09-12 DIAGNOSIS — R531 Weakness: Secondary | ICD-10-CM | POA: Diagnosis not present

## 2022-09-12 DIAGNOSIS — F411 Generalized anxiety disorder: Secondary | ICD-10-CM | POA: Diagnosis not present

## 2022-09-12 DIAGNOSIS — I10 Essential (primary) hypertension: Secondary | ICD-10-CM | POA: Diagnosis not present

## 2022-09-16 DIAGNOSIS — I10 Essential (primary) hypertension: Secondary | ICD-10-CM | POA: Diagnosis not present

## 2022-09-16 DIAGNOSIS — E441 Mild protein-calorie malnutrition: Secondary | ICD-10-CM | POA: Diagnosis not present

## 2022-09-16 DIAGNOSIS — K219 Gastro-esophageal reflux disease without esophagitis: Secondary | ICD-10-CM | POA: Diagnosis not present

## 2022-09-16 DIAGNOSIS — F411 Generalized anxiety disorder: Secondary | ICD-10-CM | POA: Diagnosis not present

## 2022-09-18 DIAGNOSIS — R531 Weakness: Secondary | ICD-10-CM | POA: Diagnosis not present

## 2022-09-18 DIAGNOSIS — I10 Essential (primary) hypertension: Secondary | ICD-10-CM | POA: Diagnosis not present

## 2022-09-18 DIAGNOSIS — E43 Unspecified severe protein-calorie malnutrition: Secondary | ICD-10-CM | POA: Diagnosis not present

## 2022-09-18 DIAGNOSIS — J449 Chronic obstructive pulmonary disease, unspecified: Secondary | ICD-10-CM | POA: Diagnosis not present

## 2022-09-21 DIAGNOSIS — I10 Essential (primary) hypertension: Secondary | ICD-10-CM | POA: Diagnosis not present

## 2022-09-21 DIAGNOSIS — K219 Gastro-esophageal reflux disease without esophagitis: Secondary | ICD-10-CM | POA: Diagnosis not present

## 2022-09-21 DIAGNOSIS — F419 Anxiety disorder, unspecified: Secondary | ICD-10-CM | POA: Diagnosis not present

## 2022-09-21 DIAGNOSIS — F331 Major depressive disorder, recurrent, moderate: Secondary | ICD-10-CM | POA: Diagnosis not present

## 2022-09-21 DIAGNOSIS — R42 Dizziness and giddiness: Secondary | ICD-10-CM | POA: Diagnosis not present

## 2022-09-21 DIAGNOSIS — F411 Generalized anxiety disorder: Secondary | ICD-10-CM | POA: Diagnosis not present

## 2022-09-21 DIAGNOSIS — E441 Mild protein-calorie malnutrition: Secondary | ICD-10-CM | POA: Diagnosis not present

## 2022-09-23 DIAGNOSIS — F419 Anxiety disorder, unspecified: Secondary | ICD-10-CM | POA: Diagnosis not present

## 2022-09-23 DIAGNOSIS — F0390 Unspecified dementia without behavioral disturbance: Secondary | ICD-10-CM | POA: Diagnosis not present

## 2022-09-23 DIAGNOSIS — F331 Major depressive disorder, recurrent, moderate: Secondary | ICD-10-CM | POA: Diagnosis not present

## 2022-10-05 DIAGNOSIS — F331 Major depressive disorder, recurrent, moderate: Secondary | ICD-10-CM | POA: Diagnosis not present

## 2022-10-07 DIAGNOSIS — F411 Generalized anxiety disorder: Secondary | ICD-10-CM | POA: Diagnosis not present

## 2022-10-07 DIAGNOSIS — I10 Essential (primary) hypertension: Secondary | ICD-10-CM | POA: Diagnosis not present

## 2022-10-07 DIAGNOSIS — G4700 Insomnia, unspecified: Secondary | ICD-10-CM | POA: Diagnosis not present

## 2022-10-07 DIAGNOSIS — R059 Cough, unspecified: Secondary | ICD-10-CM | POA: Diagnosis not present

## 2022-10-12 DIAGNOSIS — E441 Mild protein-calorie malnutrition: Secondary | ICD-10-CM | POA: Diagnosis not present

## 2022-10-12 DIAGNOSIS — F411 Generalized anxiety disorder: Secondary | ICD-10-CM | POA: Diagnosis not present

## 2022-10-12 DIAGNOSIS — K219 Gastro-esophageal reflux disease without esophagitis: Secondary | ICD-10-CM | POA: Diagnosis not present

## 2022-10-12 DIAGNOSIS — I10 Essential (primary) hypertension: Secondary | ICD-10-CM | POA: Diagnosis not present

## 2022-10-16 DIAGNOSIS — I10 Essential (primary) hypertension: Secondary | ICD-10-CM | POA: Diagnosis not present

## 2022-10-16 DIAGNOSIS — R531 Weakness: Secondary | ICD-10-CM | POA: Diagnosis not present

## 2022-10-16 DIAGNOSIS — J449 Chronic obstructive pulmonary disease, unspecified: Secondary | ICD-10-CM | POA: Diagnosis not present

## 2022-10-16 DIAGNOSIS — E43 Unspecified severe protein-calorie malnutrition: Secondary | ICD-10-CM | POA: Diagnosis not present

## 2022-10-20 DIAGNOSIS — F331 Major depressive disorder, recurrent, moderate: Secondary | ICD-10-CM | POA: Diagnosis not present

## 2022-10-20 DIAGNOSIS — F0390 Unspecified dementia without behavioral disturbance: Secondary | ICD-10-CM | POA: Diagnosis not present

## 2022-10-20 DIAGNOSIS — F419 Anxiety disorder, unspecified: Secondary | ICD-10-CM | POA: Diagnosis not present

## 2022-10-24 DIAGNOSIS — I10 Essential (primary) hypertension: Secondary | ICD-10-CM | POA: Diagnosis not present

## 2022-10-24 DIAGNOSIS — G4700 Insomnia, unspecified: Secondary | ICD-10-CM | POA: Diagnosis not present

## 2022-10-24 DIAGNOSIS — E441 Mild protein-calorie malnutrition: Secondary | ICD-10-CM | POA: Diagnosis not present

## 2022-10-24 DIAGNOSIS — K219 Gastro-esophageal reflux disease without esophagitis: Secondary | ICD-10-CM | POA: Diagnosis not present

## 2022-10-24 DIAGNOSIS — F411 Generalized anxiety disorder: Secondary | ICD-10-CM | POA: Diagnosis not present

## 2022-10-24 DIAGNOSIS — F32A Depression, unspecified: Secondary | ICD-10-CM | POA: Diagnosis not present

## 2022-10-24 DIAGNOSIS — K5900 Constipation, unspecified: Secondary | ICD-10-CM | POA: Diagnosis not present

## 2022-10-28 DIAGNOSIS — F411 Generalized anxiety disorder: Secondary | ICD-10-CM | POA: Diagnosis not present

## 2022-10-28 DIAGNOSIS — I10 Essential (primary) hypertension: Secondary | ICD-10-CM | POA: Diagnosis not present

## 2022-10-28 DIAGNOSIS — E441 Mild protein-calorie malnutrition: Secondary | ICD-10-CM | POA: Diagnosis not present

## 2022-10-28 DIAGNOSIS — K219 Gastro-esophageal reflux disease without esophagitis: Secondary | ICD-10-CM | POA: Diagnosis not present

## 2022-10-28 DIAGNOSIS — R42 Dizziness and giddiness: Secondary | ICD-10-CM | POA: Diagnosis not present

## 2022-11-09 DIAGNOSIS — F331 Major depressive disorder, recurrent, moderate: Secondary | ICD-10-CM | POA: Diagnosis not present

## 2022-11-13 DIAGNOSIS — R531 Weakness: Secondary | ICD-10-CM | POA: Diagnosis not present

## 2022-11-13 DIAGNOSIS — I10 Essential (primary) hypertension: Secondary | ICD-10-CM | POA: Diagnosis not present

## 2022-11-13 DIAGNOSIS — J449 Chronic obstructive pulmonary disease, unspecified: Secondary | ICD-10-CM | POA: Diagnosis not present

## 2022-11-13 DIAGNOSIS — E43 Unspecified severe protein-calorie malnutrition: Secondary | ICD-10-CM | POA: Diagnosis not present

## 2022-11-16 DIAGNOSIS — L603 Nail dystrophy: Secondary | ICD-10-CM | POA: Diagnosis not present

## 2022-11-16 DIAGNOSIS — I739 Peripheral vascular disease, unspecified: Secondary | ICD-10-CM | POA: Diagnosis not present

## 2022-11-17 DIAGNOSIS — F331 Major depressive disorder, recurrent, moderate: Secondary | ICD-10-CM | POA: Diagnosis not present

## 2022-11-17 DIAGNOSIS — F419 Anxiety disorder, unspecified: Secondary | ICD-10-CM | POA: Diagnosis not present

## 2022-11-17 DIAGNOSIS — F01B0 Vascular dementia, moderate, without behavioral disturbance, psychotic disturbance, mood disturbance, and anxiety: Secondary | ICD-10-CM | POA: Diagnosis not present

## 2022-11-23 DIAGNOSIS — F419 Anxiety disorder, unspecified: Secondary | ICD-10-CM | POA: Diagnosis not present

## 2022-11-23 DIAGNOSIS — F331 Major depressive disorder, recurrent, moderate: Secondary | ICD-10-CM | POA: Diagnosis not present

## 2022-11-24 DIAGNOSIS — E441 Mild protein-calorie malnutrition: Secondary | ICD-10-CM | POA: Diagnosis not present

## 2022-11-24 DIAGNOSIS — I10 Essential (primary) hypertension: Secondary | ICD-10-CM | POA: Diagnosis not present

## 2022-11-24 DIAGNOSIS — R42 Dizziness and giddiness: Secondary | ICD-10-CM | POA: Diagnosis not present

## 2022-11-24 DIAGNOSIS — F411 Generalized anxiety disorder: Secondary | ICD-10-CM | POA: Diagnosis not present

## 2022-11-28 DIAGNOSIS — F411 Generalized anxiety disorder: Secondary | ICD-10-CM | POA: Diagnosis not present

## 2022-11-28 DIAGNOSIS — I10 Essential (primary) hypertension: Secondary | ICD-10-CM | POA: Diagnosis not present

## 2022-11-28 DIAGNOSIS — E441 Mild protein-calorie malnutrition: Secondary | ICD-10-CM | POA: Diagnosis not present

## 2022-11-28 DIAGNOSIS — G4700 Insomnia, unspecified: Secondary | ICD-10-CM | POA: Diagnosis not present

## 2022-11-28 DIAGNOSIS — K5900 Constipation, unspecified: Secondary | ICD-10-CM | POA: Diagnosis not present

## 2022-11-28 DIAGNOSIS — F32A Depression, unspecified: Secondary | ICD-10-CM | POA: Diagnosis not present

## 2022-11-28 DIAGNOSIS — K219 Gastro-esophageal reflux disease without esophagitis: Secondary | ICD-10-CM | POA: Diagnosis not present

## 2022-12-01 DIAGNOSIS — I10 Essential (primary) hypertension: Secondary | ICD-10-CM | POA: Diagnosis not present

## 2022-12-01 DIAGNOSIS — F411 Generalized anxiety disorder: Secondary | ICD-10-CM | POA: Diagnosis not present

## 2022-12-01 DIAGNOSIS — R42 Dizziness and giddiness: Secondary | ICD-10-CM | POA: Diagnosis not present

## 2022-12-01 DIAGNOSIS — K219 Gastro-esophageal reflux disease without esophagitis: Secondary | ICD-10-CM | POA: Diagnosis not present

## 2022-12-07 DIAGNOSIS — F331 Major depressive disorder, recurrent, moderate: Secondary | ICD-10-CM | POA: Diagnosis not present

## 2022-12-07 DIAGNOSIS — F419 Anxiety disorder, unspecified: Secondary | ICD-10-CM | POA: Diagnosis not present

## 2022-12-15 DIAGNOSIS — K219 Gastro-esophageal reflux disease without esophagitis: Secondary | ICD-10-CM | POA: Diagnosis not present

## 2022-12-15 DIAGNOSIS — F331 Major depressive disorder, recurrent, moderate: Secondary | ICD-10-CM | POA: Diagnosis not present

## 2022-12-15 DIAGNOSIS — F01B0 Vascular dementia, moderate, without behavioral disturbance, psychotic disturbance, mood disturbance, and anxiety: Secondary | ICD-10-CM | POA: Diagnosis not present

## 2022-12-15 DIAGNOSIS — F411 Generalized anxiety disorder: Secondary | ICD-10-CM | POA: Diagnosis not present

## 2022-12-15 DIAGNOSIS — F419 Anxiety disorder, unspecified: Secondary | ICD-10-CM | POA: Diagnosis not present

## 2022-12-15 DIAGNOSIS — I10 Essential (primary) hypertension: Secondary | ICD-10-CM | POA: Diagnosis not present

## 2022-12-15 DIAGNOSIS — R42 Dizziness and giddiness: Secondary | ICD-10-CM | POA: Diagnosis not present

## 2022-12-21 DIAGNOSIS — F419 Anxiety disorder, unspecified: Secondary | ICD-10-CM | POA: Diagnosis not present

## 2022-12-21 DIAGNOSIS — F331 Major depressive disorder, recurrent, moderate: Secondary | ICD-10-CM | POA: Diagnosis not present

## 2022-12-22 DIAGNOSIS — K219 Gastro-esophageal reflux disease without esophagitis: Secondary | ICD-10-CM | POA: Diagnosis not present

## 2022-12-22 DIAGNOSIS — F411 Generalized anxiety disorder: Secondary | ICD-10-CM | POA: Diagnosis not present

## 2022-12-22 DIAGNOSIS — E441 Mild protein-calorie malnutrition: Secondary | ICD-10-CM | POA: Diagnosis not present

## 2022-12-22 DIAGNOSIS — I10 Essential (primary) hypertension: Secondary | ICD-10-CM | POA: Diagnosis not present

## 2022-12-25 DIAGNOSIS — R531 Weakness: Secondary | ICD-10-CM | POA: Diagnosis not present

## 2022-12-25 DIAGNOSIS — J449 Chronic obstructive pulmonary disease, unspecified: Secondary | ICD-10-CM | POA: Diagnosis not present

## 2022-12-25 DIAGNOSIS — I10 Essential (primary) hypertension: Secondary | ICD-10-CM | POA: Diagnosis not present

## 2022-12-25 DIAGNOSIS — E43 Unspecified severe protein-calorie malnutrition: Secondary | ICD-10-CM | POA: Diagnosis not present

## 2022-12-28 DIAGNOSIS — F419 Anxiety disorder, unspecified: Secondary | ICD-10-CM | POA: Diagnosis not present

## 2022-12-28 DIAGNOSIS — F331 Major depressive disorder, recurrent, moderate: Secondary | ICD-10-CM | POA: Diagnosis not present

## 2022-12-30 DIAGNOSIS — I10 Essential (primary) hypertension: Secondary | ICD-10-CM | POA: Diagnosis not present

## 2022-12-30 DIAGNOSIS — R42 Dizziness and giddiness: Secondary | ICD-10-CM | POA: Diagnosis not present

## 2022-12-30 DIAGNOSIS — F411 Generalized anxiety disorder: Secondary | ICD-10-CM | POA: Diagnosis not present

## 2022-12-30 DIAGNOSIS — K219 Gastro-esophageal reflux disease without esophagitis: Secondary | ICD-10-CM | POA: Diagnosis not present

## 2023-01-05 DIAGNOSIS — F419 Anxiety disorder, unspecified: Secondary | ICD-10-CM | POA: Diagnosis not present

## 2023-01-05 DIAGNOSIS — F01B0 Vascular dementia, moderate, without behavioral disturbance, psychotic disturbance, mood disturbance, and anxiety: Secondary | ICD-10-CM | POA: Diagnosis not present

## 2023-01-05 DIAGNOSIS — F331 Major depressive disorder, recurrent, moderate: Secondary | ICD-10-CM | POA: Diagnosis not present

## 2023-01-10 DIAGNOSIS — F411 Generalized anxiety disorder: Secondary | ICD-10-CM | POA: Diagnosis not present

## 2023-01-10 DIAGNOSIS — K5900 Constipation, unspecified: Secondary | ICD-10-CM | POA: Diagnosis not present

## 2023-01-10 DIAGNOSIS — K219 Gastro-esophageal reflux disease without esophagitis: Secondary | ICD-10-CM | POA: Diagnosis not present

## 2023-01-10 DIAGNOSIS — E441 Mild protein-calorie malnutrition: Secondary | ICD-10-CM | POA: Diagnosis not present

## 2023-01-10 DIAGNOSIS — F32A Depression, unspecified: Secondary | ICD-10-CM | POA: Diagnosis not present

## 2023-01-10 DIAGNOSIS — G4700 Insomnia, unspecified: Secondary | ICD-10-CM | POA: Diagnosis not present

## 2023-01-11 DIAGNOSIS — F331 Major depressive disorder, recurrent, moderate: Secondary | ICD-10-CM | POA: Diagnosis not present

## 2023-01-11 DIAGNOSIS — F419 Anxiety disorder, unspecified: Secondary | ICD-10-CM | POA: Diagnosis not present

## 2023-01-13 DIAGNOSIS — F411 Generalized anxiety disorder: Secondary | ICD-10-CM | POA: Diagnosis not present

## 2023-01-13 DIAGNOSIS — E441 Mild protein-calorie malnutrition: Secondary | ICD-10-CM | POA: Diagnosis not present

## 2023-01-13 DIAGNOSIS — I10 Essential (primary) hypertension: Secondary | ICD-10-CM | POA: Diagnosis not present

## 2023-01-13 DIAGNOSIS — K219 Gastro-esophageal reflux disease without esophagitis: Secondary | ICD-10-CM | POA: Diagnosis not present

## 2023-01-15 DIAGNOSIS — E43 Unspecified severe protein-calorie malnutrition: Secondary | ICD-10-CM | POA: Diagnosis not present

## 2023-01-15 DIAGNOSIS — R531 Weakness: Secondary | ICD-10-CM | POA: Diagnosis not present

## 2023-01-15 DIAGNOSIS — J449 Chronic obstructive pulmonary disease, unspecified: Secondary | ICD-10-CM | POA: Diagnosis not present

## 2023-01-15 DIAGNOSIS — I10 Essential (primary) hypertension: Secondary | ICD-10-CM | POA: Diagnosis not present

## 2023-01-19 DIAGNOSIS — I10 Essential (primary) hypertension: Secondary | ICD-10-CM | POA: Diagnosis not present

## 2023-01-19 DIAGNOSIS — E441 Mild protein-calorie malnutrition: Secondary | ICD-10-CM | POA: Diagnosis not present

## 2023-01-19 DIAGNOSIS — H04123 Dry eye syndrome of bilateral lacrimal glands: Secondary | ICD-10-CM | POA: Diagnosis not present

## 2023-01-19 DIAGNOSIS — F411 Generalized anxiety disorder: Secondary | ICD-10-CM | POA: Diagnosis not present

## 2023-01-19 DIAGNOSIS — K219 Gastro-esophageal reflux disease without esophagitis: Secondary | ICD-10-CM | POA: Diagnosis not present

## 2023-01-25 DIAGNOSIS — F331 Major depressive disorder, recurrent, moderate: Secondary | ICD-10-CM | POA: Diagnosis not present

## 2023-01-25 DIAGNOSIS — F419 Anxiety disorder, unspecified: Secondary | ICD-10-CM | POA: Diagnosis not present

## 2023-01-27 DIAGNOSIS — K219 Gastro-esophageal reflux disease without esophagitis: Secondary | ICD-10-CM | POA: Diagnosis not present

## 2023-01-27 DIAGNOSIS — F411 Generalized anxiety disorder: Secondary | ICD-10-CM | POA: Diagnosis not present

## 2023-01-27 DIAGNOSIS — E441 Mild protein-calorie malnutrition: Secondary | ICD-10-CM | POA: Diagnosis not present

## 2023-01-27 DIAGNOSIS — I10 Essential (primary) hypertension: Secondary | ICD-10-CM | POA: Diagnosis not present

## 2023-01-31 DIAGNOSIS — E441 Mild protein-calorie malnutrition: Secondary | ICD-10-CM | POA: Diagnosis not present

## 2023-01-31 DIAGNOSIS — K219 Gastro-esophageal reflux disease without esophagitis: Secondary | ICD-10-CM | POA: Diagnosis not present

## 2023-01-31 DIAGNOSIS — I10 Essential (primary) hypertension: Secondary | ICD-10-CM | POA: Diagnosis not present

## 2023-01-31 DIAGNOSIS — K5900 Constipation, unspecified: Secondary | ICD-10-CM | POA: Diagnosis not present

## 2023-01-31 DIAGNOSIS — F17290 Nicotine dependence, other tobacco product, uncomplicated: Secondary | ICD-10-CM | POA: Diagnosis not present

## 2023-01-31 DIAGNOSIS — F411 Generalized anxiety disorder: Secondary | ICD-10-CM | POA: Diagnosis not present

## 2023-01-31 DIAGNOSIS — F32A Depression, unspecified: Secondary | ICD-10-CM | POA: Diagnosis not present

## 2023-02-02 DIAGNOSIS — L602 Onychogryphosis: Secondary | ICD-10-CM | POA: Diagnosis not present

## 2023-02-02 DIAGNOSIS — L603 Nail dystrophy: Secondary | ICD-10-CM | POA: Diagnosis not present

## 2023-02-02 DIAGNOSIS — I739 Peripheral vascular disease, unspecified: Secondary | ICD-10-CM | POA: Diagnosis not present

## 2023-02-07 DIAGNOSIS — F32A Depression, unspecified: Secondary | ICD-10-CM | POA: Diagnosis not present

## 2023-02-07 DIAGNOSIS — K219 Gastro-esophageal reflux disease without esophagitis: Secondary | ICD-10-CM | POA: Diagnosis not present

## 2023-02-07 DIAGNOSIS — R531 Weakness: Secondary | ICD-10-CM | POA: Diagnosis not present

## 2023-02-07 DIAGNOSIS — F411 Generalized anxiety disorder: Secondary | ICD-10-CM | POA: Diagnosis not present

## 2023-02-07 DIAGNOSIS — K5900 Constipation, unspecified: Secondary | ICD-10-CM | POA: Diagnosis not present

## 2023-02-07 DIAGNOSIS — E441 Mild protein-calorie malnutrition: Secondary | ICD-10-CM | POA: Diagnosis not present

## 2023-02-08 DIAGNOSIS — F411 Generalized anxiety disorder: Secondary | ICD-10-CM | POA: Diagnosis not present

## 2023-02-08 DIAGNOSIS — K219 Gastro-esophageal reflux disease without esophagitis: Secondary | ICD-10-CM | POA: Diagnosis not present

## 2023-02-08 DIAGNOSIS — R42 Dizziness and giddiness: Secondary | ICD-10-CM | POA: Diagnosis not present

## 2023-02-08 DIAGNOSIS — I10 Essential (primary) hypertension: Secondary | ICD-10-CM | POA: Diagnosis not present

## 2023-02-09 DIAGNOSIS — F01B0 Vascular dementia, moderate, without behavioral disturbance, psychotic disturbance, mood disturbance, and anxiety: Secondary | ICD-10-CM | POA: Diagnosis not present

## 2023-02-09 DIAGNOSIS — F331 Major depressive disorder, recurrent, moderate: Secondary | ICD-10-CM | POA: Diagnosis not present

## 2023-02-09 DIAGNOSIS — F419 Anxiety disorder, unspecified: Secondary | ICD-10-CM | POA: Diagnosis not present

## 2023-02-10 DIAGNOSIS — I10 Essential (primary) hypertension: Secondary | ICD-10-CM | POA: Diagnosis not present

## 2023-02-11 DIAGNOSIS — F419 Anxiety disorder, unspecified: Secondary | ICD-10-CM | POA: Diagnosis not present

## 2023-02-11 DIAGNOSIS — F331 Major depressive disorder, recurrent, moderate: Secondary | ICD-10-CM | POA: Diagnosis not present

## 2023-02-12 DIAGNOSIS — F0390 Unspecified dementia without behavioral disturbance: Secondary | ICD-10-CM | POA: Diagnosis not present

## 2023-02-12 DIAGNOSIS — F322 Major depressive disorder, single episode, severe without psychotic features: Secondary | ICD-10-CM | POA: Diagnosis not present

## 2023-02-12 DIAGNOSIS — E43 Unspecified severe protein-calorie malnutrition: Secondary | ICD-10-CM | POA: Diagnosis not present

## 2023-02-12 DIAGNOSIS — E871 Hypo-osmolality and hyponatremia: Secondary | ICD-10-CM | POA: Diagnosis not present

## 2023-02-12 DIAGNOSIS — I10 Essential (primary) hypertension: Secondary | ICD-10-CM | POA: Diagnosis not present

## 2023-02-12 DIAGNOSIS — R531 Weakness: Secondary | ICD-10-CM | POA: Diagnosis not present

## 2023-02-12 DIAGNOSIS — J449 Chronic obstructive pulmonary disease, unspecified: Secondary | ICD-10-CM | POA: Diagnosis not present

## 2023-02-16 DIAGNOSIS — I10 Essential (primary) hypertension: Secondary | ICD-10-CM | POA: Diagnosis not present

## 2023-02-16 DIAGNOSIS — E782 Mixed hyperlipidemia: Secondary | ICD-10-CM | POA: Diagnosis not present

## 2023-02-16 DIAGNOSIS — F411 Generalized anxiety disorder: Secondary | ICD-10-CM | POA: Diagnosis not present

## 2023-02-16 DIAGNOSIS — E871 Hypo-osmolality and hyponatremia: Secondary | ICD-10-CM | POA: Diagnosis not present

## 2023-02-16 DIAGNOSIS — E441 Mild protein-calorie malnutrition: Secondary | ICD-10-CM | POA: Diagnosis not present

## 2023-02-24 DIAGNOSIS — R42 Dizziness and giddiness: Secondary | ICD-10-CM | POA: Diagnosis not present

## 2023-02-24 DIAGNOSIS — F411 Generalized anxiety disorder: Secondary | ICD-10-CM | POA: Diagnosis not present

## 2023-02-24 DIAGNOSIS — E441 Mild protein-calorie malnutrition: Secondary | ICD-10-CM | POA: Diagnosis not present

## 2023-02-24 DIAGNOSIS — I10 Essential (primary) hypertension: Secondary | ICD-10-CM | POA: Diagnosis not present

## 2023-02-26 DIAGNOSIS — F419 Anxiety disorder, unspecified: Secondary | ICD-10-CM | POA: Diagnosis not present

## 2023-02-26 DIAGNOSIS — F331 Major depressive disorder, recurrent, moderate: Secondary | ICD-10-CM | POA: Diagnosis not present

## 2023-02-27 DIAGNOSIS — I10 Essential (primary) hypertension: Secondary | ICD-10-CM | POA: Diagnosis not present

## 2023-02-27 DIAGNOSIS — K5900 Constipation, unspecified: Secondary | ICD-10-CM | POA: Diagnosis not present

## 2023-02-27 DIAGNOSIS — E441 Mild protein-calorie malnutrition: Secondary | ICD-10-CM | POA: Diagnosis not present

## 2023-02-27 DIAGNOSIS — F411 Generalized anxiety disorder: Secondary | ICD-10-CM | POA: Diagnosis not present

## 2023-02-27 DIAGNOSIS — K219 Gastro-esophageal reflux disease without esophagitis: Secondary | ICD-10-CM | POA: Diagnosis not present

## 2023-02-27 DIAGNOSIS — F32A Depression, unspecified: Secondary | ICD-10-CM | POA: Diagnosis not present

## 2023-03-01 DIAGNOSIS — F03B18 Unspecified dementia, moderate, with other behavioral disturbance: Secondary | ICD-10-CM | POA: Diagnosis not present

## 2023-03-01 DIAGNOSIS — R262 Difficulty in walking, not elsewhere classified: Secondary | ICD-10-CM | POA: Diagnosis not present

## 2023-03-01 DIAGNOSIS — F329 Major depressive disorder, single episode, unspecified: Secondary | ICD-10-CM | POA: Diagnosis not present

## 2023-03-01 DIAGNOSIS — E43 Unspecified severe protein-calorie malnutrition: Secondary | ICD-10-CM | POA: Diagnosis not present

## 2023-03-01 DIAGNOSIS — Z741 Need for assistance with personal care: Secondary | ICD-10-CM | POA: Diagnosis not present

## 2023-03-01 DIAGNOSIS — M25512 Pain in left shoulder: Secondary | ICD-10-CM | POA: Diagnosis not present

## 2023-03-01 DIAGNOSIS — M6281 Muscle weakness (generalized): Secondary | ICD-10-CM | POA: Diagnosis not present

## 2023-03-01 DIAGNOSIS — R55 Syncope and collapse: Secondary | ICD-10-CM | POA: Diagnosis not present

## 2023-03-01 DIAGNOSIS — R2689 Other abnormalities of gait and mobility: Secondary | ICD-10-CM | POA: Diagnosis not present

## 2023-03-03 DIAGNOSIS — M25512 Pain in left shoulder: Secondary | ICD-10-CM | POA: Diagnosis not present

## 2023-03-03 DIAGNOSIS — R2689 Other abnormalities of gait and mobility: Secondary | ICD-10-CM | POA: Diagnosis not present

## 2023-03-03 DIAGNOSIS — R55 Syncope and collapse: Secondary | ICD-10-CM | POA: Diagnosis not present

## 2023-03-03 DIAGNOSIS — F03B18 Unspecified dementia, moderate, with other behavioral disturbance: Secondary | ICD-10-CM | POA: Diagnosis not present

## 2023-03-03 DIAGNOSIS — Z741 Need for assistance with personal care: Secondary | ICD-10-CM | POA: Diagnosis not present

## 2023-03-03 DIAGNOSIS — E43 Unspecified severe protein-calorie malnutrition: Secondary | ICD-10-CM | POA: Diagnosis not present

## 2023-03-03 DIAGNOSIS — R262 Difficulty in walking, not elsewhere classified: Secondary | ICD-10-CM | POA: Diagnosis not present

## 2023-03-03 DIAGNOSIS — F329 Major depressive disorder, single episode, unspecified: Secondary | ICD-10-CM | POA: Diagnosis not present

## 2023-03-03 DIAGNOSIS — M6281 Muscle weakness (generalized): Secondary | ICD-10-CM | POA: Diagnosis not present

## 2023-03-09 DIAGNOSIS — F419 Anxiety disorder, unspecified: Secondary | ICD-10-CM | POA: Diagnosis not present

## 2023-03-09 DIAGNOSIS — F01B0 Vascular dementia, moderate, without behavioral disturbance, psychotic disturbance, mood disturbance, and anxiety: Secondary | ICD-10-CM | POA: Diagnosis not present

## 2023-03-09 DIAGNOSIS — F331 Major depressive disorder, recurrent, moderate: Secondary | ICD-10-CM | POA: Diagnosis not present

## 2023-03-19 DIAGNOSIS — F331 Major depressive disorder, recurrent, moderate: Secondary | ICD-10-CM | POA: Diagnosis not present

## 2023-03-31 DIAGNOSIS — F329 Major depressive disorder, single episode, unspecified: Secondary | ICD-10-CM | POA: Diagnosis not present

## 2023-03-31 DIAGNOSIS — R55 Syncope and collapse: Secondary | ICD-10-CM | POA: Diagnosis not present

## 2023-03-31 DIAGNOSIS — R262 Difficulty in walking, not elsewhere classified: Secondary | ICD-10-CM | POA: Diagnosis not present

## 2023-03-31 DIAGNOSIS — E43 Unspecified severe protein-calorie malnutrition: Secondary | ICD-10-CM | POA: Diagnosis not present

## 2023-03-31 DIAGNOSIS — Z741 Need for assistance with personal care: Secondary | ICD-10-CM | POA: Diagnosis not present

## 2023-03-31 DIAGNOSIS — M6281 Muscle weakness (generalized): Secondary | ICD-10-CM | POA: Diagnosis not present

## 2023-03-31 DIAGNOSIS — F03B18 Unspecified dementia, moderate, with other behavioral disturbance: Secondary | ICD-10-CM | POA: Diagnosis not present

## 2023-03-31 DIAGNOSIS — R2689 Other abnormalities of gait and mobility: Secondary | ICD-10-CM | POA: Diagnosis not present

## 2023-03-31 DIAGNOSIS — M25512 Pain in left shoulder: Secondary | ICD-10-CM | POA: Diagnosis not present

## 2023-04-02 DIAGNOSIS — F331 Major depressive disorder, recurrent, moderate: Secondary | ICD-10-CM | POA: Diagnosis not present

## 2023-04-12 DIAGNOSIS — S0990XA Unspecified injury of head, initial encounter: Secondary | ICD-10-CM | POA: Diagnosis not present

## 2023-04-12 DIAGNOSIS — Z043 Encounter for examination and observation following other accident: Secondary | ICD-10-CM | POA: Diagnosis not present

## 2023-04-12 DIAGNOSIS — R2681 Unsteadiness on feet: Secondary | ICD-10-CM | POA: Diagnosis not present

## 2023-04-12 DIAGNOSIS — E871 Hypo-osmolality and hyponatremia: Secondary | ICD-10-CM | POA: Diagnosis not present

## 2023-04-13 DIAGNOSIS — E43 Unspecified severe protein-calorie malnutrition: Secondary | ICD-10-CM | POA: Diagnosis not present

## 2023-04-13 DIAGNOSIS — M25512 Pain in left shoulder: Secondary | ICD-10-CM | POA: Diagnosis not present

## 2023-04-13 DIAGNOSIS — M6281 Muscle weakness (generalized): Secondary | ICD-10-CM | POA: Diagnosis not present

## 2023-04-13 DIAGNOSIS — R262 Difficulty in walking, not elsewhere classified: Secondary | ICD-10-CM | POA: Diagnosis not present

## 2023-04-13 DIAGNOSIS — R55 Syncope and collapse: Secondary | ICD-10-CM | POA: Diagnosis not present

## 2023-04-13 DIAGNOSIS — Z741 Need for assistance with personal care: Secondary | ICD-10-CM | POA: Diagnosis not present

## 2023-04-13 DIAGNOSIS — I10 Essential (primary) hypertension: Secondary | ICD-10-CM | POA: Diagnosis not present

## 2023-04-13 DIAGNOSIS — F329 Major depressive disorder, single episode, unspecified: Secondary | ICD-10-CM | POA: Diagnosis not present

## 2023-04-13 DIAGNOSIS — F03B18 Unspecified dementia, moderate, with other behavioral disturbance: Secondary | ICD-10-CM | POA: Diagnosis not present

## 2023-04-13 DIAGNOSIS — R2689 Other abnormalities of gait and mobility: Secondary | ICD-10-CM | POA: Diagnosis not present

## 2023-04-15 DIAGNOSIS — R2689 Other abnormalities of gait and mobility: Secondary | ICD-10-CM | POA: Diagnosis not present

## 2023-04-15 DIAGNOSIS — M6281 Muscle weakness (generalized): Secondary | ICD-10-CM | POA: Diagnosis not present

## 2023-04-15 DIAGNOSIS — R262 Difficulty in walking, not elsewhere classified: Secondary | ICD-10-CM | POA: Diagnosis not present

## 2023-04-15 DIAGNOSIS — F03B18 Unspecified dementia, moderate, with other behavioral disturbance: Secondary | ICD-10-CM | POA: Diagnosis not present

## 2023-04-15 DIAGNOSIS — E43 Unspecified severe protein-calorie malnutrition: Secondary | ICD-10-CM | POA: Diagnosis not present

## 2023-04-15 DIAGNOSIS — Z741 Need for assistance with personal care: Secondary | ICD-10-CM | POA: Diagnosis not present

## 2023-04-15 DIAGNOSIS — R55 Syncope and collapse: Secondary | ICD-10-CM | POA: Diagnosis not present

## 2023-04-15 DIAGNOSIS — F329 Major depressive disorder, single episode, unspecified: Secondary | ICD-10-CM | POA: Diagnosis not present

## 2023-04-15 DIAGNOSIS — M25512 Pain in left shoulder: Secondary | ICD-10-CM | POA: Diagnosis not present

## 2023-04-16 DIAGNOSIS — F331 Major depressive disorder, recurrent, moderate: Secondary | ICD-10-CM | POA: Diagnosis not present

## 2023-04-26 DIAGNOSIS — R2689 Other abnormalities of gait and mobility: Secondary | ICD-10-CM | POA: Diagnosis not present

## 2023-04-26 DIAGNOSIS — R55 Syncope and collapse: Secondary | ICD-10-CM | POA: Diagnosis not present

## 2023-04-26 DIAGNOSIS — E43 Unspecified severe protein-calorie malnutrition: Secondary | ICD-10-CM | POA: Diagnosis not present

## 2023-04-26 DIAGNOSIS — M25512 Pain in left shoulder: Secondary | ICD-10-CM | POA: Diagnosis not present

## 2023-04-26 DIAGNOSIS — R262 Difficulty in walking, not elsewhere classified: Secondary | ICD-10-CM | POA: Diagnosis not present

## 2023-04-26 DIAGNOSIS — Z741 Need for assistance with personal care: Secondary | ICD-10-CM | POA: Diagnosis not present

## 2023-04-26 DIAGNOSIS — M6281 Muscle weakness (generalized): Secondary | ICD-10-CM | POA: Diagnosis not present

## 2023-04-26 DIAGNOSIS — F03B18 Unspecified dementia, moderate, with other behavioral disturbance: Secondary | ICD-10-CM | POA: Diagnosis not present

## 2023-04-26 DIAGNOSIS — F329 Major depressive disorder, single episode, unspecified: Secondary | ICD-10-CM | POA: Diagnosis not present

## 2023-04-27 DIAGNOSIS — F01B0 Vascular dementia, moderate, without behavioral disturbance, psychotic disturbance, mood disturbance, and anxiety: Secondary | ICD-10-CM | POA: Diagnosis not present

## 2023-04-27 DIAGNOSIS — Z741 Need for assistance with personal care: Secondary | ICD-10-CM | POA: Diagnosis not present

## 2023-04-27 DIAGNOSIS — F331 Major depressive disorder, recurrent, moderate: Secondary | ICD-10-CM | POA: Diagnosis not present

## 2023-04-27 DIAGNOSIS — F03B18 Unspecified dementia, moderate, with other behavioral disturbance: Secondary | ICD-10-CM | POA: Diagnosis not present

## 2023-04-27 DIAGNOSIS — N390 Urinary tract infection, site not specified: Secondary | ICD-10-CM | POA: Diagnosis not present

## 2023-04-27 DIAGNOSIS — F329 Major depressive disorder, single episode, unspecified: Secondary | ICD-10-CM | POA: Diagnosis not present

## 2023-04-27 DIAGNOSIS — R2689 Other abnormalities of gait and mobility: Secondary | ICD-10-CM | POA: Diagnosis not present

## 2023-04-27 DIAGNOSIS — M25512 Pain in left shoulder: Secondary | ICD-10-CM | POA: Diagnosis not present

## 2023-04-27 DIAGNOSIS — R55 Syncope and collapse: Secondary | ICD-10-CM | POA: Diagnosis not present

## 2023-04-27 DIAGNOSIS — F419 Anxiety disorder, unspecified: Secondary | ICD-10-CM | POA: Diagnosis not present

## 2023-04-27 DIAGNOSIS — E43 Unspecified severe protein-calorie malnutrition: Secondary | ICD-10-CM | POA: Diagnosis not present

## 2023-04-27 DIAGNOSIS — M6281 Muscle weakness (generalized): Secondary | ICD-10-CM | POA: Diagnosis not present

## 2023-04-27 DIAGNOSIS — R262 Difficulty in walking, not elsewhere classified: Secondary | ICD-10-CM | POA: Diagnosis not present

## 2023-05-03 DIAGNOSIS — F331 Major depressive disorder, recurrent, moderate: Secondary | ICD-10-CM | POA: Diagnosis not present

## 2023-05-10 DIAGNOSIS — F419 Anxiety disorder, unspecified: Secondary | ICD-10-CM | POA: Diagnosis not present

## 2023-05-10 DIAGNOSIS — F330 Major depressive disorder, recurrent, mild: Secondary | ICD-10-CM | POA: Diagnosis not present

## 2023-05-11 DIAGNOSIS — F331 Major depressive disorder, recurrent, moderate: Secondary | ICD-10-CM | POA: Diagnosis not present

## 2023-05-11 DIAGNOSIS — F01B0 Vascular dementia, moderate, without behavioral disturbance, psychotic disturbance, mood disturbance, and anxiety: Secondary | ICD-10-CM | POA: Diagnosis not present

## 2023-05-11 DIAGNOSIS — F0390 Unspecified dementia without behavioral disturbance: Secondary | ICD-10-CM | POA: Diagnosis not present

## 2023-05-21 DIAGNOSIS — F331 Major depressive disorder, recurrent, moderate: Secondary | ICD-10-CM | POA: Diagnosis not present

## 2023-05-21 DIAGNOSIS — F419 Anxiety disorder, unspecified: Secondary | ICD-10-CM | POA: Diagnosis not present

## 2023-05-23 DIAGNOSIS — M25512 Pain in left shoulder: Secondary | ICD-10-CM | POA: Diagnosis not present

## 2023-05-23 DIAGNOSIS — M6281 Muscle weakness (generalized): Secondary | ICD-10-CM | POA: Diagnosis not present

## 2023-05-23 DIAGNOSIS — R55 Syncope and collapse: Secondary | ICD-10-CM | POA: Diagnosis not present

## 2023-05-23 DIAGNOSIS — E43 Unspecified severe protein-calorie malnutrition: Secondary | ICD-10-CM | POA: Diagnosis not present

## 2023-05-23 DIAGNOSIS — R2689 Other abnormalities of gait and mobility: Secondary | ICD-10-CM | POA: Diagnosis not present

## 2023-05-23 DIAGNOSIS — F03B18 Unspecified dementia, moderate, with other behavioral disturbance: Secondary | ICD-10-CM | POA: Diagnosis not present

## 2023-05-23 DIAGNOSIS — Z741 Need for assistance with personal care: Secondary | ICD-10-CM | POA: Diagnosis not present

## 2023-05-23 DIAGNOSIS — R262 Difficulty in walking, not elsewhere classified: Secondary | ICD-10-CM | POA: Diagnosis not present

## 2023-05-23 DIAGNOSIS — F329 Major depressive disorder, single episode, unspecified: Secondary | ICD-10-CM | POA: Diagnosis not present

## 2023-05-27 DIAGNOSIS — L602 Onychogryphosis: Secondary | ICD-10-CM | POA: Diagnosis not present

## 2023-05-27 DIAGNOSIS — L603 Nail dystrophy: Secondary | ICD-10-CM | POA: Diagnosis not present

## 2023-05-27 DIAGNOSIS — F331 Major depressive disorder, recurrent, moderate: Secondary | ICD-10-CM | POA: Diagnosis not present

## 2023-05-27 DIAGNOSIS — I739 Peripheral vascular disease, unspecified: Secondary | ICD-10-CM | POA: Diagnosis not present

## 2023-05-29 DIAGNOSIS — R55 Syncope and collapse: Secondary | ICD-10-CM | POA: Diagnosis not present

## 2023-05-29 DIAGNOSIS — E43 Unspecified severe protein-calorie malnutrition: Secondary | ICD-10-CM | POA: Diagnosis not present

## 2023-05-29 DIAGNOSIS — R262 Difficulty in walking, not elsewhere classified: Secondary | ICD-10-CM | POA: Diagnosis not present

## 2023-05-29 DIAGNOSIS — R2689 Other abnormalities of gait and mobility: Secondary | ICD-10-CM | POA: Diagnosis not present

## 2023-05-29 DIAGNOSIS — F03B18 Unspecified dementia, moderate, with other behavioral disturbance: Secondary | ICD-10-CM | POA: Diagnosis not present

## 2023-05-29 DIAGNOSIS — F329 Major depressive disorder, single episode, unspecified: Secondary | ICD-10-CM | POA: Diagnosis not present

## 2023-05-29 DIAGNOSIS — M6281 Muscle weakness (generalized): Secondary | ICD-10-CM | POA: Diagnosis not present

## 2023-05-29 DIAGNOSIS — Z741 Need for assistance with personal care: Secondary | ICD-10-CM | POA: Diagnosis not present

## 2023-05-29 DIAGNOSIS — M25512 Pain in left shoulder: Secondary | ICD-10-CM | POA: Diagnosis not present

## 2023-06-08 DIAGNOSIS — F331 Major depressive disorder, recurrent, moderate: Secondary | ICD-10-CM | POA: Diagnosis not present

## 2023-06-08 DIAGNOSIS — F01B0 Vascular dementia, moderate, without behavioral disturbance, psychotic disturbance, mood disturbance, and anxiety: Secondary | ICD-10-CM | POA: Diagnosis not present

## 2023-06-08 DIAGNOSIS — F419 Anxiety disorder, unspecified: Secondary | ICD-10-CM | POA: Diagnosis not present

## 2023-06-15 DIAGNOSIS — F03B18 Unspecified dementia, moderate, with other behavioral disturbance: Secondary | ICD-10-CM | POA: Diagnosis not present

## 2023-06-15 DIAGNOSIS — M25512 Pain in left shoulder: Secondary | ICD-10-CM | POA: Diagnosis not present

## 2023-06-15 DIAGNOSIS — R55 Syncope and collapse: Secondary | ICD-10-CM | POA: Diagnosis not present

## 2023-06-15 DIAGNOSIS — F329 Major depressive disorder, single episode, unspecified: Secondary | ICD-10-CM | POA: Diagnosis not present

## 2023-06-15 DIAGNOSIS — R262 Difficulty in walking, not elsewhere classified: Secondary | ICD-10-CM | POA: Diagnosis not present

## 2023-06-15 DIAGNOSIS — Z741 Need for assistance with personal care: Secondary | ICD-10-CM | POA: Diagnosis not present

## 2023-06-15 DIAGNOSIS — R2689 Other abnormalities of gait and mobility: Secondary | ICD-10-CM | POA: Diagnosis not present

## 2023-06-15 DIAGNOSIS — E43 Unspecified severe protein-calorie malnutrition: Secondary | ICD-10-CM | POA: Diagnosis not present

## 2023-06-15 DIAGNOSIS — M6281 Muscle weakness (generalized): Secondary | ICD-10-CM | POA: Diagnosis not present

## 2023-06-18 DIAGNOSIS — F331 Major depressive disorder, recurrent, moderate: Secondary | ICD-10-CM | POA: Diagnosis not present

## 2023-06-19 DIAGNOSIS — F329 Major depressive disorder, single episode, unspecified: Secondary | ICD-10-CM | POA: Diagnosis not present

## 2023-06-19 DIAGNOSIS — M25512 Pain in left shoulder: Secondary | ICD-10-CM | POA: Diagnosis not present

## 2023-06-19 DIAGNOSIS — R55 Syncope and collapse: Secondary | ICD-10-CM | POA: Diagnosis not present

## 2023-06-19 DIAGNOSIS — F03B18 Unspecified dementia, moderate, with other behavioral disturbance: Secondary | ICD-10-CM | POA: Diagnosis not present

## 2023-06-19 DIAGNOSIS — R2689 Other abnormalities of gait and mobility: Secondary | ICD-10-CM | POA: Diagnosis not present

## 2023-06-19 DIAGNOSIS — R262 Difficulty in walking, not elsewhere classified: Secondary | ICD-10-CM | POA: Diagnosis not present

## 2023-06-19 DIAGNOSIS — E43 Unspecified severe protein-calorie malnutrition: Secondary | ICD-10-CM | POA: Diagnosis not present

## 2023-06-19 DIAGNOSIS — M6281 Muscle weakness (generalized): Secondary | ICD-10-CM | POA: Diagnosis not present

## 2023-06-19 DIAGNOSIS — Z741 Need for assistance with personal care: Secondary | ICD-10-CM | POA: Diagnosis not present

## 2023-07-02 DIAGNOSIS — F331 Major depressive disorder, recurrent, moderate: Secondary | ICD-10-CM | POA: Diagnosis not present

## 2023-07-02 DIAGNOSIS — F321 Major depressive disorder, single episode, moderate: Secondary | ICD-10-CM | POA: Diagnosis not present

## 2023-07-02 DIAGNOSIS — F419 Anxiety disorder, unspecified: Secondary | ICD-10-CM | POA: Diagnosis not present

## 2023-07-06 DIAGNOSIS — F419 Anxiety disorder, unspecified: Secondary | ICD-10-CM | POA: Diagnosis not present

## 2023-07-06 DIAGNOSIS — F01B0 Vascular dementia, moderate, without behavioral disturbance, psychotic disturbance, mood disturbance, and anxiety: Secondary | ICD-10-CM | POA: Diagnosis not present

## 2023-07-06 DIAGNOSIS — F331 Major depressive disorder, recurrent, moderate: Secondary | ICD-10-CM | POA: Diagnosis not present

## 2023-07-07 DIAGNOSIS — M25512 Pain in left shoulder: Secondary | ICD-10-CM | POA: Diagnosis not present

## 2023-07-07 DIAGNOSIS — F329 Major depressive disorder, single episode, unspecified: Secondary | ICD-10-CM | POA: Diagnosis not present

## 2023-07-07 DIAGNOSIS — E43 Unspecified severe protein-calorie malnutrition: Secondary | ICD-10-CM | POA: Diagnosis not present

## 2023-07-07 DIAGNOSIS — M6281 Muscle weakness (generalized): Secondary | ICD-10-CM | POA: Diagnosis not present

## 2023-07-07 DIAGNOSIS — R262 Difficulty in walking, not elsewhere classified: Secondary | ICD-10-CM | POA: Diagnosis not present

## 2023-07-07 DIAGNOSIS — Z741 Need for assistance with personal care: Secondary | ICD-10-CM | POA: Diagnosis not present

## 2023-07-07 DIAGNOSIS — R55 Syncope and collapse: Secondary | ICD-10-CM | POA: Diagnosis not present

## 2023-07-07 DIAGNOSIS — R2689 Other abnormalities of gait and mobility: Secondary | ICD-10-CM | POA: Diagnosis not present

## 2023-07-07 DIAGNOSIS — F03B18 Unspecified dementia, moderate, with other behavioral disturbance: Secondary | ICD-10-CM | POA: Diagnosis not present

## 2023-07-16 DIAGNOSIS — F331 Major depressive disorder, recurrent, moderate: Secondary | ICD-10-CM | POA: Diagnosis not present

## 2023-07-16 DIAGNOSIS — F419 Anxiety disorder, unspecified: Secondary | ICD-10-CM | POA: Diagnosis not present

## 2023-07-21 DIAGNOSIS — M25512 Pain in left shoulder: Secondary | ICD-10-CM | POA: Diagnosis not present

## 2023-07-21 DIAGNOSIS — M6281 Muscle weakness (generalized): Secondary | ICD-10-CM | POA: Diagnosis not present

## 2023-07-21 DIAGNOSIS — F03B18 Unspecified dementia, moderate, with other behavioral disturbance: Secondary | ICD-10-CM | POA: Diagnosis not present

## 2023-07-21 DIAGNOSIS — R2689 Other abnormalities of gait and mobility: Secondary | ICD-10-CM | POA: Diagnosis not present

## 2023-07-21 DIAGNOSIS — Z741 Need for assistance with personal care: Secondary | ICD-10-CM | POA: Diagnosis not present

## 2023-07-21 DIAGNOSIS — R55 Syncope and collapse: Secondary | ICD-10-CM | POA: Diagnosis not present

## 2023-07-21 DIAGNOSIS — F329 Major depressive disorder, single episode, unspecified: Secondary | ICD-10-CM | POA: Diagnosis not present

## 2023-07-21 DIAGNOSIS — R262 Difficulty in walking, not elsewhere classified: Secondary | ICD-10-CM | POA: Diagnosis not present

## 2023-07-21 DIAGNOSIS — E43 Unspecified severe protein-calorie malnutrition: Secondary | ICD-10-CM | POA: Diagnosis not present

## 2023-07-26 DIAGNOSIS — F419 Anxiety disorder, unspecified: Secondary | ICD-10-CM | POA: Diagnosis not present

## 2023-07-26 DIAGNOSIS — F321 Major depressive disorder, single episode, moderate: Secondary | ICD-10-CM | POA: Diagnosis not present

## 2023-07-30 DIAGNOSIS — F329 Major depressive disorder, single episode, unspecified: Secondary | ICD-10-CM | POA: Diagnosis not present

## 2023-07-30 DIAGNOSIS — R2689 Other abnormalities of gait and mobility: Secondary | ICD-10-CM | POA: Diagnosis not present

## 2023-07-30 DIAGNOSIS — F03B18 Unspecified dementia, moderate, with other behavioral disturbance: Secondary | ICD-10-CM | POA: Diagnosis not present

## 2023-07-30 DIAGNOSIS — M6281 Muscle weakness (generalized): Secondary | ICD-10-CM | POA: Diagnosis not present

## 2023-07-30 DIAGNOSIS — R55 Syncope and collapse: Secondary | ICD-10-CM | POA: Diagnosis not present

## 2023-07-30 DIAGNOSIS — F331 Major depressive disorder, recurrent, moderate: Secondary | ICD-10-CM | POA: Diagnosis not present

## 2023-07-30 DIAGNOSIS — Z741 Need for assistance with personal care: Secondary | ICD-10-CM | POA: Diagnosis not present

## 2023-07-30 DIAGNOSIS — E43 Unspecified severe protein-calorie malnutrition: Secondary | ICD-10-CM | POA: Diagnosis not present

## 2023-07-30 DIAGNOSIS — M25512 Pain in left shoulder: Secondary | ICD-10-CM | POA: Diagnosis not present

## 2023-07-30 DIAGNOSIS — R262 Difficulty in walking, not elsewhere classified: Secondary | ICD-10-CM | POA: Diagnosis not present

## 2023-08-03 DIAGNOSIS — F419 Anxiety disorder, unspecified: Secondary | ICD-10-CM | POA: Diagnosis not present

## 2023-08-03 DIAGNOSIS — F01B0 Vascular dementia, moderate, without behavioral disturbance, psychotic disturbance, mood disturbance, and anxiety: Secondary | ICD-10-CM | POA: Diagnosis not present

## 2023-08-03 DIAGNOSIS — F331 Major depressive disorder, recurrent, moderate: Secondary | ICD-10-CM | POA: Diagnosis not present

## 2023-08-13 DIAGNOSIS — F419 Anxiety disorder, unspecified: Secondary | ICD-10-CM | POA: Diagnosis not present

## 2023-08-13 DIAGNOSIS — F331 Major depressive disorder, recurrent, moderate: Secondary | ICD-10-CM | POA: Diagnosis not present

## 2023-08-19 DIAGNOSIS — F03B18 Unspecified dementia, moderate, with other behavioral disturbance: Secondary | ICD-10-CM | POA: Diagnosis not present

## 2023-08-19 DIAGNOSIS — R262 Difficulty in walking, not elsewhere classified: Secondary | ICD-10-CM | POA: Diagnosis not present

## 2023-08-19 DIAGNOSIS — M25512 Pain in left shoulder: Secondary | ICD-10-CM | POA: Diagnosis not present

## 2023-08-19 DIAGNOSIS — Z741 Need for assistance with personal care: Secondary | ICD-10-CM | POA: Diagnosis not present

## 2023-08-19 DIAGNOSIS — R55 Syncope and collapse: Secondary | ICD-10-CM | POA: Diagnosis not present

## 2023-08-19 DIAGNOSIS — R2689 Other abnormalities of gait and mobility: Secondary | ICD-10-CM | POA: Diagnosis not present

## 2023-08-19 DIAGNOSIS — E43 Unspecified severe protein-calorie malnutrition: Secondary | ICD-10-CM | POA: Diagnosis not present

## 2023-08-19 DIAGNOSIS — M6281 Muscle weakness (generalized): Secondary | ICD-10-CM | POA: Diagnosis not present

## 2023-08-19 DIAGNOSIS — F329 Major depressive disorder, single episode, unspecified: Secondary | ICD-10-CM | POA: Diagnosis not present

## 2023-08-23 DIAGNOSIS — R262 Difficulty in walking, not elsewhere classified: Secondary | ICD-10-CM | POA: Diagnosis not present

## 2023-08-23 DIAGNOSIS — E43 Unspecified severe protein-calorie malnutrition: Secondary | ICD-10-CM | POA: Diagnosis not present

## 2023-08-23 DIAGNOSIS — F03B18 Unspecified dementia, moderate, with other behavioral disturbance: Secondary | ICD-10-CM | POA: Diagnosis not present

## 2023-08-23 DIAGNOSIS — M6281 Muscle weakness (generalized): Secondary | ICD-10-CM | POA: Diagnosis not present

## 2023-08-23 DIAGNOSIS — F329 Major depressive disorder, single episode, unspecified: Secondary | ICD-10-CM | POA: Diagnosis not present

## 2023-08-23 DIAGNOSIS — R55 Syncope and collapse: Secondary | ICD-10-CM | POA: Diagnosis not present

## 2023-08-23 DIAGNOSIS — M25512 Pain in left shoulder: Secondary | ICD-10-CM | POA: Diagnosis not present

## 2023-08-23 DIAGNOSIS — Z741 Need for assistance with personal care: Secondary | ICD-10-CM | POA: Diagnosis not present

## 2023-08-23 DIAGNOSIS — R2689 Other abnormalities of gait and mobility: Secondary | ICD-10-CM | POA: Diagnosis not present

## 2023-08-27 DIAGNOSIS — F419 Anxiety disorder, unspecified: Secondary | ICD-10-CM | POA: Diagnosis not present

## 2023-08-27 DIAGNOSIS — F331 Major depressive disorder, recurrent, moderate: Secondary | ICD-10-CM | POA: Diagnosis not present

## 2023-08-29 DIAGNOSIS — R55 Syncope and collapse: Secondary | ICD-10-CM | POA: Diagnosis not present

## 2023-08-29 DIAGNOSIS — R2689 Other abnormalities of gait and mobility: Secondary | ICD-10-CM | POA: Diagnosis not present

## 2023-08-29 DIAGNOSIS — F03B18 Unspecified dementia, moderate, with other behavioral disturbance: Secondary | ICD-10-CM | POA: Diagnosis not present

## 2023-08-29 DIAGNOSIS — E43 Unspecified severe protein-calorie malnutrition: Secondary | ICD-10-CM | POA: Diagnosis not present

## 2023-08-29 DIAGNOSIS — M25512 Pain in left shoulder: Secondary | ICD-10-CM | POA: Diagnosis not present

## 2023-08-29 DIAGNOSIS — R262 Difficulty in walking, not elsewhere classified: Secondary | ICD-10-CM | POA: Diagnosis not present

## 2023-08-29 DIAGNOSIS — Z741 Need for assistance with personal care: Secondary | ICD-10-CM | POA: Diagnosis not present

## 2023-08-29 DIAGNOSIS — F329 Major depressive disorder, single episode, unspecified: Secondary | ICD-10-CM | POA: Diagnosis not present

## 2023-08-29 DIAGNOSIS — M6281 Muscle weakness (generalized): Secondary | ICD-10-CM | POA: Diagnosis not present

## 2023-08-30 DIAGNOSIS — F321 Major depressive disorder, single episode, moderate: Secondary | ICD-10-CM | POA: Diagnosis not present

## 2023-08-30 DIAGNOSIS — F419 Anxiety disorder, unspecified: Secondary | ICD-10-CM | POA: Diagnosis not present

## 2023-08-31 DIAGNOSIS — F419 Anxiety disorder, unspecified: Secondary | ICD-10-CM | POA: Diagnosis not present

## 2023-08-31 DIAGNOSIS — F01B0 Vascular dementia, moderate, without behavioral disturbance, psychotic disturbance, mood disturbance, and anxiety: Secondary | ICD-10-CM | POA: Diagnosis not present

## 2023-08-31 DIAGNOSIS — H25813 Combined forms of age-related cataract, bilateral: Secondary | ICD-10-CM | POA: Diagnosis not present

## 2023-08-31 DIAGNOSIS — F331 Major depressive disorder, recurrent, moderate: Secondary | ICD-10-CM | POA: Diagnosis not present

## 2023-09-09 DIAGNOSIS — M6281 Muscle weakness (generalized): Secondary | ICD-10-CM | POA: Diagnosis not present

## 2023-09-09 DIAGNOSIS — F329 Major depressive disorder, single episode, unspecified: Secondary | ICD-10-CM | POA: Diagnosis not present

## 2023-09-09 DIAGNOSIS — F03B18 Unspecified dementia, moderate, with other behavioral disturbance: Secondary | ICD-10-CM | POA: Diagnosis not present

## 2023-09-09 DIAGNOSIS — M25512 Pain in left shoulder: Secondary | ICD-10-CM | POA: Diagnosis not present

## 2023-09-09 DIAGNOSIS — R2689 Other abnormalities of gait and mobility: Secondary | ICD-10-CM | POA: Diagnosis not present

## 2023-09-09 DIAGNOSIS — R55 Syncope and collapse: Secondary | ICD-10-CM | POA: Diagnosis not present

## 2023-09-09 DIAGNOSIS — Z741 Need for assistance with personal care: Secondary | ICD-10-CM | POA: Diagnosis not present

## 2023-09-09 DIAGNOSIS — E43 Unspecified severe protein-calorie malnutrition: Secondary | ICD-10-CM | POA: Diagnosis not present

## 2023-09-09 DIAGNOSIS — R262 Difficulty in walking, not elsewhere classified: Secondary | ICD-10-CM | POA: Diagnosis not present

## 2023-09-10 DIAGNOSIS — F331 Major depressive disorder, recurrent, moderate: Secondary | ICD-10-CM | POA: Diagnosis not present

## 2023-09-10 DIAGNOSIS — F419 Anxiety disorder, unspecified: Secondary | ICD-10-CM | POA: Diagnosis not present

## 2023-09-10 DIAGNOSIS — K769 Liver disease, unspecified: Secondary | ICD-10-CM | POA: Diagnosis not present

## 2023-09-13 DIAGNOSIS — F321 Major depressive disorder, single episode, moderate: Secondary | ICD-10-CM | POA: Diagnosis not present

## 2023-09-13 DIAGNOSIS — F419 Anxiety disorder, unspecified: Secondary | ICD-10-CM | POA: Diagnosis not present

## 2023-09-16 DIAGNOSIS — R262 Difficulty in walking, not elsewhere classified: Secondary | ICD-10-CM | POA: Diagnosis not present

## 2023-09-16 DIAGNOSIS — Z741 Need for assistance with personal care: Secondary | ICD-10-CM | POA: Diagnosis not present

## 2023-09-16 DIAGNOSIS — F03B18 Unspecified dementia, moderate, with other behavioral disturbance: Secondary | ICD-10-CM | POA: Diagnosis not present

## 2023-09-16 DIAGNOSIS — M6281 Muscle weakness (generalized): Secondary | ICD-10-CM | POA: Diagnosis not present

## 2023-09-16 DIAGNOSIS — F329 Major depressive disorder, single episode, unspecified: Secondary | ICD-10-CM | POA: Diagnosis not present

## 2023-09-16 DIAGNOSIS — E43 Unspecified severe protein-calorie malnutrition: Secondary | ICD-10-CM | POA: Diagnosis not present

## 2023-09-16 DIAGNOSIS — R2689 Other abnormalities of gait and mobility: Secondary | ICD-10-CM | POA: Diagnosis not present

## 2023-09-16 DIAGNOSIS — R55 Syncope and collapse: Secondary | ICD-10-CM | POA: Diagnosis not present

## 2023-09-16 DIAGNOSIS — M25512 Pain in left shoulder: Secondary | ICD-10-CM | POA: Diagnosis not present

## 2023-09-24 DIAGNOSIS — F331 Major depressive disorder, recurrent, moderate: Secondary | ICD-10-CM | POA: Diagnosis not present

## 2023-09-24 DIAGNOSIS — F419 Anxiety disorder, unspecified: Secondary | ICD-10-CM | POA: Diagnosis not present

## 2023-09-26 DIAGNOSIS — Z741 Need for assistance with personal care: Secondary | ICD-10-CM | POA: Diagnosis not present

## 2023-09-26 DIAGNOSIS — R55 Syncope and collapse: Secondary | ICD-10-CM | POA: Diagnosis not present

## 2023-09-26 DIAGNOSIS — F329 Major depressive disorder, single episode, unspecified: Secondary | ICD-10-CM | POA: Diagnosis not present

## 2023-09-26 DIAGNOSIS — M25512 Pain in left shoulder: Secondary | ICD-10-CM | POA: Diagnosis not present

## 2023-09-26 DIAGNOSIS — E43 Unspecified severe protein-calorie malnutrition: Secondary | ICD-10-CM | POA: Diagnosis not present

## 2023-09-26 DIAGNOSIS — M6281 Muscle weakness (generalized): Secondary | ICD-10-CM | POA: Diagnosis not present

## 2023-09-26 DIAGNOSIS — R2689 Other abnormalities of gait and mobility: Secondary | ICD-10-CM | POA: Diagnosis not present

## 2023-09-26 DIAGNOSIS — R262 Difficulty in walking, not elsewhere classified: Secondary | ICD-10-CM | POA: Diagnosis not present

## 2023-09-26 DIAGNOSIS — F03B18 Unspecified dementia, moderate, with other behavioral disturbance: Secondary | ICD-10-CM | POA: Diagnosis not present

## 2023-10-08 DIAGNOSIS — F331 Major depressive disorder, recurrent, moderate: Secondary | ICD-10-CM | POA: Diagnosis not present

## 2023-10-08 DIAGNOSIS — F419 Anxiety disorder, unspecified: Secondary | ICD-10-CM | POA: Diagnosis not present

## 2023-10-12 DIAGNOSIS — F01B0 Vascular dementia, moderate, without behavioral disturbance, psychotic disturbance, mood disturbance, and anxiety: Secondary | ICD-10-CM | POA: Diagnosis not present

## 2023-10-12 DIAGNOSIS — F419 Anxiety disorder, unspecified: Secondary | ICD-10-CM | POA: Diagnosis not present

## 2023-10-12 DIAGNOSIS — F331 Major depressive disorder, recurrent, moderate: Secondary | ICD-10-CM | POA: Diagnosis not present

## 2023-10-13 DIAGNOSIS — L602 Onychogryphosis: Secondary | ICD-10-CM | POA: Diagnosis not present

## 2023-10-13 DIAGNOSIS — I739 Peripheral vascular disease, unspecified: Secondary | ICD-10-CM | POA: Diagnosis not present

## 2023-10-13 DIAGNOSIS — L603 Nail dystrophy: Secondary | ICD-10-CM | POA: Diagnosis not present

## 2023-10-16 DIAGNOSIS — E43 Unspecified severe protein-calorie malnutrition: Secondary | ICD-10-CM | POA: Diagnosis not present

## 2023-10-16 DIAGNOSIS — M6281 Muscle weakness (generalized): Secondary | ICD-10-CM | POA: Diagnosis not present

## 2023-10-16 DIAGNOSIS — Z741 Need for assistance with personal care: Secondary | ICD-10-CM | POA: Diagnosis not present

## 2023-10-16 DIAGNOSIS — R55 Syncope and collapse: Secondary | ICD-10-CM | POA: Diagnosis not present

## 2023-10-16 DIAGNOSIS — F03B18 Unspecified dementia, moderate, with other behavioral disturbance: Secondary | ICD-10-CM | POA: Diagnosis not present

## 2023-10-16 DIAGNOSIS — R2689 Other abnormalities of gait and mobility: Secondary | ICD-10-CM | POA: Diagnosis not present

## 2023-10-16 DIAGNOSIS — M25512 Pain in left shoulder: Secondary | ICD-10-CM | POA: Diagnosis not present

## 2023-10-16 DIAGNOSIS — F329 Major depressive disorder, single episode, unspecified: Secondary | ICD-10-CM | POA: Diagnosis not present

## 2023-10-16 DIAGNOSIS — R262 Difficulty in walking, not elsewhere classified: Secondary | ICD-10-CM | POA: Diagnosis not present

## 2023-10-17 DIAGNOSIS — Z741 Need for assistance with personal care: Secondary | ICD-10-CM | POA: Diagnosis not present

## 2023-10-17 DIAGNOSIS — R262 Difficulty in walking, not elsewhere classified: Secondary | ICD-10-CM | POA: Diagnosis not present

## 2023-10-17 DIAGNOSIS — R2689 Other abnormalities of gait and mobility: Secondary | ICD-10-CM | POA: Diagnosis not present

## 2023-10-17 DIAGNOSIS — M6281 Muscle weakness (generalized): Secondary | ICD-10-CM | POA: Diagnosis not present

## 2023-10-17 DIAGNOSIS — R278 Other lack of coordination: Secondary | ICD-10-CM | POA: Diagnosis not present

## 2023-10-17 DIAGNOSIS — R55 Syncope and collapse: Secondary | ICD-10-CM | POA: Diagnosis not present

## 2023-10-17 DIAGNOSIS — F03B18 Unspecified dementia, moderate, with other behavioral disturbance: Secondary | ICD-10-CM | POA: Diagnosis not present

## 2023-10-17 DIAGNOSIS — E43 Unspecified severe protein-calorie malnutrition: Secondary | ICD-10-CM | POA: Diagnosis not present

## 2023-10-17 DIAGNOSIS — M25512 Pain in left shoulder: Secondary | ICD-10-CM | POA: Diagnosis not present

## 2023-10-18 DIAGNOSIS — F321 Major depressive disorder, single episode, moderate: Secondary | ICD-10-CM | POA: Diagnosis not present

## 2023-10-18 DIAGNOSIS — F419 Anxiety disorder, unspecified: Secondary | ICD-10-CM | POA: Diagnosis not present

## 2023-10-20 DIAGNOSIS — M6281 Muscle weakness (generalized): Secondary | ICD-10-CM | POA: Diagnosis not present

## 2023-10-20 DIAGNOSIS — R262 Difficulty in walking, not elsewhere classified: Secondary | ICD-10-CM | POA: Diagnosis not present

## 2023-10-20 DIAGNOSIS — F329 Major depressive disorder, single episode, unspecified: Secondary | ICD-10-CM | POA: Diagnosis not present

## 2023-10-20 DIAGNOSIS — Z741 Need for assistance with personal care: Secondary | ICD-10-CM | POA: Diagnosis not present

## 2023-10-20 DIAGNOSIS — M25512 Pain in left shoulder: Secondary | ICD-10-CM | POA: Diagnosis not present

## 2023-10-20 DIAGNOSIS — R55 Syncope and collapse: Secondary | ICD-10-CM | POA: Diagnosis not present

## 2023-10-20 DIAGNOSIS — R2689 Other abnormalities of gait and mobility: Secondary | ICD-10-CM | POA: Diagnosis not present

## 2023-10-20 DIAGNOSIS — F03B18 Unspecified dementia, moderate, with other behavioral disturbance: Secondary | ICD-10-CM | POA: Diagnosis not present

## 2023-10-20 DIAGNOSIS — E43 Unspecified severe protein-calorie malnutrition: Secondary | ICD-10-CM | POA: Diagnosis not present

## 2023-10-22 DIAGNOSIS — F419 Anxiety disorder, unspecified: Secondary | ICD-10-CM | POA: Diagnosis not present

## 2023-10-22 DIAGNOSIS — F331 Major depressive disorder, recurrent, moderate: Secondary | ICD-10-CM | POA: Diagnosis not present

## 2023-11-05 DIAGNOSIS — F331 Major depressive disorder, recurrent, moderate: Secondary | ICD-10-CM | POA: Diagnosis not present

## 2023-11-05 DIAGNOSIS — F419 Anxiety disorder, unspecified: Secondary | ICD-10-CM | POA: Diagnosis not present

## 2023-11-09 DIAGNOSIS — F01B0 Vascular dementia, moderate, without behavioral disturbance, psychotic disturbance, mood disturbance, and anxiety: Secondary | ICD-10-CM | POA: Diagnosis not present

## 2023-11-09 DIAGNOSIS — F331 Major depressive disorder, recurrent, moderate: Secondary | ICD-10-CM | POA: Diagnosis not present

## 2023-11-09 DIAGNOSIS — F419 Anxiety disorder, unspecified: Secondary | ICD-10-CM | POA: Diagnosis not present

## 2023-11-15 DIAGNOSIS — F321 Major depressive disorder, single episode, moderate: Secondary | ICD-10-CM | POA: Diagnosis not present

## 2023-11-15 DIAGNOSIS — F0390 Unspecified dementia without behavioral disturbance: Secondary | ICD-10-CM | POA: Diagnosis not present

## 2023-11-16 DIAGNOSIS — M6281 Muscle weakness (generalized): Secondary | ICD-10-CM | POA: Diagnosis not present

## 2023-11-16 DIAGNOSIS — M25512 Pain in left shoulder: Secondary | ICD-10-CM | POA: Diagnosis not present

## 2023-11-16 DIAGNOSIS — R55 Syncope and collapse: Secondary | ICD-10-CM | POA: Diagnosis not present

## 2023-11-16 DIAGNOSIS — Z741 Need for assistance with personal care: Secondary | ICD-10-CM | POA: Diagnosis not present

## 2023-11-16 DIAGNOSIS — R262 Difficulty in walking, not elsewhere classified: Secondary | ICD-10-CM | POA: Diagnosis not present

## 2023-11-16 DIAGNOSIS — F03B18 Unspecified dementia, moderate, with other behavioral disturbance: Secondary | ICD-10-CM | POA: Diagnosis not present

## 2023-11-16 DIAGNOSIS — F329 Major depressive disorder, single episode, unspecified: Secondary | ICD-10-CM | POA: Diagnosis not present

## 2023-11-16 DIAGNOSIS — R2689 Other abnormalities of gait and mobility: Secondary | ICD-10-CM | POA: Diagnosis not present

## 2023-11-16 DIAGNOSIS — E43 Unspecified severe protein-calorie malnutrition: Secondary | ICD-10-CM | POA: Diagnosis not present

## 2023-11-19 DIAGNOSIS — F419 Anxiety disorder, unspecified: Secondary | ICD-10-CM | POA: Diagnosis not present

## 2023-11-19 DIAGNOSIS — F331 Major depressive disorder, recurrent, moderate: Secondary | ICD-10-CM | POA: Diagnosis not present

## 2023-11-22 DIAGNOSIS — E43 Unspecified severe protein-calorie malnutrition: Secondary | ICD-10-CM | POA: Diagnosis not present

## 2023-11-22 DIAGNOSIS — R2689 Other abnormalities of gait and mobility: Secondary | ICD-10-CM | POA: Diagnosis not present

## 2023-11-22 DIAGNOSIS — M6281 Muscle weakness (generalized): Secondary | ICD-10-CM | POA: Diagnosis not present

## 2023-11-22 DIAGNOSIS — R55 Syncope and collapse: Secondary | ICD-10-CM | POA: Diagnosis not present

## 2023-11-22 DIAGNOSIS — Z741 Need for assistance with personal care: Secondary | ICD-10-CM | POA: Diagnosis not present

## 2023-11-22 DIAGNOSIS — R278 Other lack of coordination: Secondary | ICD-10-CM | POA: Diagnosis not present

## 2023-11-22 DIAGNOSIS — R262 Difficulty in walking, not elsewhere classified: Secondary | ICD-10-CM | POA: Diagnosis not present

## 2023-11-22 DIAGNOSIS — F03B18 Unspecified dementia, moderate, with other behavioral disturbance: Secondary | ICD-10-CM | POA: Diagnosis not present

## 2023-11-22 DIAGNOSIS — M25512 Pain in left shoulder: Secondary | ICD-10-CM | POA: Diagnosis not present

## 2023-12-02 DIAGNOSIS — F331 Major depressive disorder, recurrent, moderate: Secondary | ICD-10-CM | POA: Diagnosis not present

## 2023-12-07 DIAGNOSIS — F01B0 Vascular dementia, moderate, without behavioral disturbance, psychotic disturbance, mood disturbance, and anxiety: Secondary | ICD-10-CM | POA: Diagnosis not present

## 2023-12-07 DIAGNOSIS — F331 Major depressive disorder, recurrent, moderate: Secondary | ICD-10-CM | POA: Diagnosis not present

## 2023-12-07 DIAGNOSIS — F419 Anxiety disorder, unspecified: Secondary | ICD-10-CM | POA: Diagnosis not present

## 2023-12-16 DIAGNOSIS — Z741 Need for assistance with personal care: Secondary | ICD-10-CM | POA: Diagnosis not present

## 2023-12-16 DIAGNOSIS — M25512 Pain in left shoulder: Secondary | ICD-10-CM | POA: Diagnosis not present

## 2023-12-16 DIAGNOSIS — F03B18 Unspecified dementia, moderate, with other behavioral disturbance: Secondary | ICD-10-CM | POA: Diagnosis not present

## 2023-12-16 DIAGNOSIS — R2689 Other abnormalities of gait and mobility: Secondary | ICD-10-CM | POA: Diagnosis not present

## 2023-12-16 DIAGNOSIS — F329 Major depressive disorder, single episode, unspecified: Secondary | ICD-10-CM | POA: Diagnosis not present

## 2023-12-16 DIAGNOSIS — R55 Syncope and collapse: Secondary | ICD-10-CM | POA: Diagnosis not present

## 2023-12-16 DIAGNOSIS — M6281 Muscle weakness (generalized): Secondary | ICD-10-CM | POA: Diagnosis not present

## 2023-12-16 DIAGNOSIS — R262 Difficulty in walking, not elsewhere classified: Secondary | ICD-10-CM | POA: Diagnosis not present

## 2023-12-16 DIAGNOSIS — E43 Unspecified severe protein-calorie malnutrition: Secondary | ICD-10-CM | POA: Diagnosis not present

## 2023-12-17 DIAGNOSIS — F419 Anxiety disorder, unspecified: Secondary | ICD-10-CM | POA: Diagnosis not present

## 2023-12-17 DIAGNOSIS — F331 Major depressive disorder, recurrent, moderate: Secondary | ICD-10-CM | POA: Diagnosis not present

## 2023-12-18 DIAGNOSIS — R278 Other lack of coordination: Secondary | ICD-10-CM | POA: Diagnosis not present

## 2023-12-18 DIAGNOSIS — R55 Syncope and collapse: Secondary | ICD-10-CM | POA: Diagnosis not present

## 2023-12-18 DIAGNOSIS — R2689 Other abnormalities of gait and mobility: Secondary | ICD-10-CM | POA: Diagnosis not present

## 2023-12-18 DIAGNOSIS — M25512 Pain in left shoulder: Secondary | ICD-10-CM | POA: Diagnosis not present

## 2023-12-18 DIAGNOSIS — Z741 Need for assistance with personal care: Secondary | ICD-10-CM | POA: Diagnosis not present

## 2023-12-18 DIAGNOSIS — M6281 Muscle weakness (generalized): Secondary | ICD-10-CM | POA: Diagnosis not present

## 2023-12-18 DIAGNOSIS — R262 Difficulty in walking, not elsewhere classified: Secondary | ICD-10-CM | POA: Diagnosis not present

## 2023-12-18 DIAGNOSIS — F03B18 Unspecified dementia, moderate, with other behavioral disturbance: Secondary | ICD-10-CM | POA: Diagnosis not present

## 2023-12-18 DIAGNOSIS — E43 Unspecified severe protein-calorie malnutrition: Secondary | ICD-10-CM | POA: Diagnosis not present

## 2023-12-29 DIAGNOSIS — F0390 Unspecified dementia without behavioral disturbance: Secondary | ICD-10-CM | POA: Diagnosis not present

## 2023-12-29 DIAGNOSIS — F321 Major depressive disorder, single episode, moderate: Secondary | ICD-10-CM | POA: Diagnosis not present

## 2023-12-31 DIAGNOSIS — F331 Major depressive disorder, recurrent, moderate: Secondary | ICD-10-CM | POA: Diagnosis not present

## 2024-01-04 DIAGNOSIS — F01B0 Vascular dementia, moderate, without behavioral disturbance, psychotic disturbance, mood disturbance, and anxiety: Secondary | ICD-10-CM | POA: Diagnosis not present

## 2024-01-04 DIAGNOSIS — F419 Anxiety disorder, unspecified: Secondary | ICD-10-CM | POA: Diagnosis not present

## 2024-01-04 DIAGNOSIS — F331 Major depressive disorder, recurrent, moderate: Secondary | ICD-10-CM | POA: Diagnosis not present

## 2024-01-08 DIAGNOSIS — M25512 Pain in left shoulder: Secondary | ICD-10-CM | POA: Diagnosis not present

## 2024-01-08 DIAGNOSIS — R55 Syncope and collapse: Secondary | ICD-10-CM | POA: Diagnosis not present

## 2024-01-08 DIAGNOSIS — Z741 Need for assistance with personal care: Secondary | ICD-10-CM | POA: Diagnosis not present

## 2024-01-08 DIAGNOSIS — R2689 Other abnormalities of gait and mobility: Secondary | ICD-10-CM | POA: Diagnosis not present

## 2024-01-08 DIAGNOSIS — E43 Unspecified severe protein-calorie malnutrition: Secondary | ICD-10-CM | POA: Diagnosis not present

## 2024-01-08 DIAGNOSIS — R262 Difficulty in walking, not elsewhere classified: Secondary | ICD-10-CM | POA: Diagnosis not present

## 2024-01-08 DIAGNOSIS — M6281 Muscle weakness (generalized): Secondary | ICD-10-CM | POA: Diagnosis not present

## 2024-01-08 DIAGNOSIS — F03B18 Unspecified dementia, moderate, with other behavioral disturbance: Secondary | ICD-10-CM | POA: Diagnosis not present

## 2024-01-08 DIAGNOSIS — R278 Other lack of coordination: Secondary | ICD-10-CM | POA: Diagnosis not present

## 2024-01-14 DIAGNOSIS — F419 Anxiety disorder, unspecified: Secondary | ICD-10-CM | POA: Diagnosis not present

## 2024-01-14 DIAGNOSIS — Z741 Need for assistance with personal care: Secondary | ICD-10-CM | POA: Diagnosis not present

## 2024-01-14 DIAGNOSIS — F331 Major depressive disorder, recurrent, moderate: Secondary | ICD-10-CM | POA: Diagnosis not present

## 2024-01-14 DIAGNOSIS — M25512 Pain in left shoulder: Secondary | ICD-10-CM | POA: Diagnosis not present

## 2024-01-14 DIAGNOSIS — R262 Difficulty in walking, not elsewhere classified: Secondary | ICD-10-CM | POA: Diagnosis not present

## 2024-01-14 DIAGNOSIS — R55 Syncope and collapse: Secondary | ICD-10-CM | POA: Diagnosis not present

## 2024-01-14 DIAGNOSIS — R278 Other lack of coordination: Secondary | ICD-10-CM | POA: Diagnosis not present

## 2024-01-14 DIAGNOSIS — E43 Unspecified severe protein-calorie malnutrition: Secondary | ICD-10-CM | POA: Diagnosis not present

## 2024-01-14 DIAGNOSIS — R2689 Other abnormalities of gait and mobility: Secondary | ICD-10-CM | POA: Diagnosis not present

## 2024-01-14 DIAGNOSIS — F03B18 Unspecified dementia, moderate, with other behavioral disturbance: Secondary | ICD-10-CM | POA: Diagnosis not present

## 2024-01-14 DIAGNOSIS — M6281 Muscle weakness (generalized): Secondary | ICD-10-CM | POA: Diagnosis not present

## 2024-01-16 DIAGNOSIS — R2689 Other abnormalities of gait and mobility: Secondary | ICD-10-CM | POA: Diagnosis not present

## 2024-01-16 DIAGNOSIS — Z741 Need for assistance with personal care: Secondary | ICD-10-CM | POA: Diagnosis not present

## 2024-01-16 DIAGNOSIS — M25512 Pain in left shoulder: Secondary | ICD-10-CM | POA: Diagnosis not present

## 2024-01-16 DIAGNOSIS — M6281 Muscle weakness (generalized): Secondary | ICD-10-CM | POA: Diagnosis not present

## 2024-01-16 DIAGNOSIS — R55 Syncope and collapse: Secondary | ICD-10-CM | POA: Diagnosis not present

## 2024-01-16 DIAGNOSIS — R262 Difficulty in walking, not elsewhere classified: Secondary | ICD-10-CM | POA: Diagnosis not present

## 2024-01-16 DIAGNOSIS — E43 Unspecified severe protein-calorie malnutrition: Secondary | ICD-10-CM | POA: Diagnosis not present

## 2024-01-16 DIAGNOSIS — R278 Other lack of coordination: Secondary | ICD-10-CM | POA: Diagnosis not present

## 2024-01-16 DIAGNOSIS — F03B18 Unspecified dementia, moderate, with other behavioral disturbance: Secondary | ICD-10-CM | POA: Diagnosis not present

## 2024-01-19 DIAGNOSIS — L603 Nail dystrophy: Secondary | ICD-10-CM | POA: Diagnosis not present

## 2024-01-19 DIAGNOSIS — I739 Peripheral vascular disease, unspecified: Secondary | ICD-10-CM | POA: Diagnosis not present

## 2024-01-19 DIAGNOSIS — L602 Onychogryphosis: Secondary | ICD-10-CM | POA: Diagnosis not present

## 2024-01-27 DIAGNOSIS — F321 Major depressive disorder, single episode, moderate: Secondary | ICD-10-CM | POA: Diagnosis not present

## 2024-01-27 DIAGNOSIS — F0390 Unspecified dementia without behavioral disturbance: Secondary | ICD-10-CM | POA: Diagnosis not present

## 2024-01-28 DIAGNOSIS — F419 Anxiety disorder, unspecified: Secondary | ICD-10-CM | POA: Diagnosis not present

## 2024-01-28 DIAGNOSIS — F331 Major depressive disorder, recurrent, moderate: Secondary | ICD-10-CM | POA: Diagnosis not present

## 2024-02-01 DIAGNOSIS — F331 Major depressive disorder, recurrent, moderate: Secondary | ICD-10-CM | POA: Diagnosis not present

## 2024-02-01 DIAGNOSIS — F419 Anxiety disorder, unspecified: Secondary | ICD-10-CM | POA: Diagnosis not present

## 2024-02-01 DIAGNOSIS — F01B0 Vascular dementia, moderate, without behavioral disturbance, psychotic disturbance, mood disturbance, and anxiety: Secondary | ICD-10-CM | POA: Diagnosis not present

## 2024-02-14 DIAGNOSIS — R262 Difficulty in walking, not elsewhere classified: Secondary | ICD-10-CM | POA: Diagnosis not present

## 2024-02-14 DIAGNOSIS — R55 Syncope and collapse: Secondary | ICD-10-CM | POA: Diagnosis not present

## 2024-02-14 DIAGNOSIS — F03B18 Unspecified dementia, moderate, with other behavioral disturbance: Secondary | ICD-10-CM | POA: Diagnosis not present

## 2024-02-14 DIAGNOSIS — Z741 Need for assistance with personal care: Secondary | ICD-10-CM | POA: Diagnosis not present

## 2024-02-14 DIAGNOSIS — M6281 Muscle weakness (generalized): Secondary | ICD-10-CM | POA: Diagnosis not present

## 2024-02-14 DIAGNOSIS — R41841 Cognitive communication deficit: Secondary | ICD-10-CM | POA: Diagnosis not present

## 2024-02-14 DIAGNOSIS — R278 Other lack of coordination: Secondary | ICD-10-CM | POA: Diagnosis not present

## 2024-02-14 DIAGNOSIS — R2689 Other abnormalities of gait and mobility: Secondary | ICD-10-CM | POA: Diagnosis not present

## 2024-02-14 DIAGNOSIS — M25512 Pain in left shoulder: Secondary | ICD-10-CM | POA: Diagnosis not present

## 2024-02-17 DIAGNOSIS — R55 Syncope and collapse: Secondary | ICD-10-CM | POA: Diagnosis not present

## 2024-02-17 DIAGNOSIS — Z741 Need for assistance with personal care: Secondary | ICD-10-CM | POA: Diagnosis not present

## 2024-02-17 DIAGNOSIS — R278 Other lack of coordination: Secondary | ICD-10-CM | POA: Diagnosis not present

## 2024-02-17 DIAGNOSIS — R262 Difficulty in walking, not elsewhere classified: Secondary | ICD-10-CM | POA: Diagnosis not present

## 2024-02-17 DIAGNOSIS — E43 Unspecified severe protein-calorie malnutrition: Secondary | ICD-10-CM | POA: Diagnosis not present

## 2024-02-17 DIAGNOSIS — F03B18 Unspecified dementia, moderate, with other behavioral disturbance: Secondary | ICD-10-CM | POA: Diagnosis not present

## 2024-02-17 DIAGNOSIS — R2689 Other abnormalities of gait and mobility: Secondary | ICD-10-CM | POA: Diagnosis not present

## 2024-02-17 DIAGNOSIS — M6281 Muscle weakness (generalized): Secondary | ICD-10-CM | POA: Diagnosis not present

## 2024-02-17 DIAGNOSIS — M25512 Pain in left shoulder: Secondary | ICD-10-CM | POA: Diagnosis not present

## 2024-02-18 DIAGNOSIS — F331 Major depressive disorder, recurrent, moderate: Secondary | ICD-10-CM | POA: Diagnosis not present

## 2024-02-25 DIAGNOSIS — F321 Major depressive disorder, single episode, moderate: Secondary | ICD-10-CM | POA: Diagnosis not present

## 2024-02-28 DIAGNOSIS — F331 Major depressive disorder, recurrent, moderate: Secondary | ICD-10-CM | POA: Diagnosis not present

## 2024-02-28 DIAGNOSIS — F419 Anxiety disorder, unspecified: Secondary | ICD-10-CM | POA: Diagnosis not present

## 2024-02-28 DIAGNOSIS — F01B0 Vascular dementia, moderate, without behavioral disturbance, psychotic disturbance, mood disturbance, and anxiety: Secondary | ICD-10-CM | POA: Diagnosis not present

## 2024-03-03 DIAGNOSIS — F331 Major depressive disorder, recurrent, moderate: Secondary | ICD-10-CM | POA: Diagnosis not present

## 2024-03-03 DIAGNOSIS — F419 Anxiety disorder, unspecified: Secondary | ICD-10-CM | POA: Diagnosis not present

## 2024-03-11 DIAGNOSIS — F03B18 Unspecified dementia, moderate, with other behavioral disturbance: Secondary | ICD-10-CM | POA: Diagnosis not present

## 2024-03-11 DIAGNOSIS — R2689 Other abnormalities of gait and mobility: Secondary | ICD-10-CM | POA: Diagnosis not present

## 2024-03-11 DIAGNOSIS — M25512 Pain in left shoulder: Secondary | ICD-10-CM | POA: Diagnosis not present

## 2024-03-11 DIAGNOSIS — R278 Other lack of coordination: Secondary | ICD-10-CM | POA: Diagnosis not present

## 2024-03-11 DIAGNOSIS — R262 Difficulty in walking, not elsewhere classified: Secondary | ICD-10-CM | POA: Diagnosis not present

## 2024-03-11 DIAGNOSIS — Z741 Need for assistance with personal care: Secondary | ICD-10-CM | POA: Diagnosis not present

## 2024-03-11 DIAGNOSIS — R55 Syncope and collapse: Secondary | ICD-10-CM | POA: Diagnosis not present

## 2024-03-11 DIAGNOSIS — M6281 Muscle weakness (generalized): Secondary | ICD-10-CM | POA: Diagnosis not present

## 2024-03-11 DIAGNOSIS — R41841 Cognitive communication deficit: Secondary | ICD-10-CM | POA: Diagnosis not present

## 2024-03-17 DIAGNOSIS — F419 Anxiety disorder, unspecified: Secondary | ICD-10-CM | POA: Diagnosis not present

## 2024-03-18 DIAGNOSIS — R262 Difficulty in walking, not elsewhere classified: Secondary | ICD-10-CM | POA: Diagnosis not present

## 2024-03-18 DIAGNOSIS — R2689 Other abnormalities of gait and mobility: Secondary | ICD-10-CM | POA: Diagnosis not present

## 2024-03-18 DIAGNOSIS — Z741 Need for assistance with personal care: Secondary | ICD-10-CM | POA: Diagnosis not present

## 2024-03-18 DIAGNOSIS — R55 Syncope and collapse: Secondary | ICD-10-CM | POA: Diagnosis not present

## 2024-03-18 DIAGNOSIS — R278 Other lack of coordination: Secondary | ICD-10-CM | POA: Diagnosis not present

## 2024-03-18 DIAGNOSIS — M6281 Muscle weakness (generalized): Secondary | ICD-10-CM | POA: Diagnosis not present

## 2024-03-18 DIAGNOSIS — F03B18 Unspecified dementia, moderate, with other behavioral disturbance: Secondary | ICD-10-CM | POA: Diagnosis not present

## 2024-03-18 DIAGNOSIS — M25512 Pain in left shoulder: Secondary | ICD-10-CM | POA: Diagnosis not present

## 2024-03-18 DIAGNOSIS — R41841 Cognitive communication deficit: Secondary | ICD-10-CM | POA: Diagnosis not present

## 2024-03-21 DIAGNOSIS — L602 Onychogryphosis: Secondary | ICD-10-CM | POA: Diagnosis not present

## 2024-03-21 DIAGNOSIS — F411 Generalized anxiety disorder: Secondary | ICD-10-CM | POA: Diagnosis not present

## 2024-03-21 DIAGNOSIS — L853 Xerosis cutis: Secondary | ICD-10-CM | POA: Diagnosis not present

## 2024-03-21 DIAGNOSIS — L603 Nail dystrophy: Secondary | ICD-10-CM | POA: Diagnosis not present

## 2024-03-21 DIAGNOSIS — I739 Peripheral vascular disease, unspecified: Secondary | ICD-10-CM | POA: Diagnosis not present

## 2024-03-21 DIAGNOSIS — F330 Major depressive disorder, recurrent, mild: Secondary | ICD-10-CM | POA: Diagnosis not present

## 2024-03-27 DIAGNOSIS — F0390 Unspecified dementia without behavioral disturbance: Secondary | ICD-10-CM | POA: Diagnosis not present

## 2024-03-27 DIAGNOSIS — F321 Major depressive disorder, single episode, moderate: Secondary | ICD-10-CM | POA: Diagnosis not present

## 2024-03-28 DIAGNOSIS — F330 Major depressive disorder, recurrent, mild: Secondary | ICD-10-CM | POA: Diagnosis not present

## 2024-03-28 DIAGNOSIS — F411 Generalized anxiety disorder: Secondary | ICD-10-CM | POA: Diagnosis not present

## 2024-03-28 DIAGNOSIS — F01B0 Vascular dementia, moderate, without behavioral disturbance, psychotic disturbance, mood disturbance, and anxiety: Secondary | ICD-10-CM | POA: Diagnosis not present

## 2024-04-04 DIAGNOSIS — R55 Syncope and collapse: Secondary | ICD-10-CM | POA: Diagnosis not present

## 2024-04-04 DIAGNOSIS — M6281 Muscle weakness (generalized): Secondary | ICD-10-CM | POA: Diagnosis not present

## 2024-04-04 DIAGNOSIS — R2689 Other abnormalities of gait and mobility: Secondary | ICD-10-CM | POA: Diagnosis not present

## 2024-04-04 DIAGNOSIS — R41841 Cognitive communication deficit: Secondary | ICD-10-CM | POA: Diagnosis not present

## 2024-04-07 DIAGNOSIS — F411 Generalized anxiety disorder: Secondary | ICD-10-CM | POA: Diagnosis not present

## 2024-04-19 DIAGNOSIS — F411 Generalized anxiety disorder: Secondary | ICD-10-CM | POA: Diagnosis not present

## 2024-04-19 DIAGNOSIS — F330 Major depressive disorder, recurrent, mild: Secondary | ICD-10-CM | POA: Diagnosis not present

## 2024-04-25 DIAGNOSIS — F0390 Unspecified dementia without behavioral disturbance: Secondary | ICD-10-CM | POA: Diagnosis not present

## 2024-04-25 DIAGNOSIS — F330 Major depressive disorder, recurrent, mild: Secondary | ICD-10-CM | POA: Diagnosis not present

## 2024-04-25 DIAGNOSIS — F411 Generalized anxiety disorder: Secondary | ICD-10-CM | POA: Diagnosis not present

## 2024-05-05 DIAGNOSIS — F330 Major depressive disorder, recurrent, mild: Secondary | ICD-10-CM | POA: Diagnosis not present

## 2024-05-05 DIAGNOSIS — F411 Generalized anxiety disorder: Secondary | ICD-10-CM | POA: Diagnosis not present
